# Patient Record
Sex: Female | Born: 1975
Health system: Southern US, Community
[De-identification: ages and names within clinical notes are randomized; demographics above are authoritative.]

## PROBLEM LIST (undated history)

## (undated) DIAGNOSIS — K439 Ventral hernia without obstruction or gangrene: Secondary | ICD-10-CM

## (undated) DIAGNOSIS — N946 Dysmenorrhea, unspecified: Secondary | ICD-10-CM

## (undated) DIAGNOSIS — N6452 Nipple discharge: Secondary | ICD-10-CM

## (undated) DIAGNOSIS — D649 Anemia, unspecified: Secondary | ICD-10-CM

## (undated) DIAGNOSIS — Z87442 Personal history of urinary calculi: Secondary | ICD-10-CM

## (undated) DIAGNOSIS — N6310 Unspecified lump in the right breast, unspecified quadrant: Secondary | ICD-10-CM

## (undated) DIAGNOSIS — R102 Pelvic and perineal pain: Secondary | ICD-10-CM

## (undated) DIAGNOSIS — O4400 Placenta previa specified as without hemorrhage, unspecified trimester: Secondary | ICD-10-CM

## (undated) DIAGNOSIS — N941 Unspecified dyspareunia: Secondary | ICD-10-CM

## (undated) DIAGNOSIS — N200 Calculus of kidney: Secondary | ICD-10-CM

## (undated) DIAGNOSIS — L309 Dermatitis, unspecified: Secondary | ICD-10-CM

## (undated) DIAGNOSIS — K219 Gastro-esophageal reflux disease without esophagitis: Secondary | ICD-10-CM

## (undated) DIAGNOSIS — N6320 Unspecified lump in the left breast, unspecified quadrant: Secondary | ICD-10-CM

## (undated) DIAGNOSIS — R011 Cardiac murmur, unspecified: Secondary | ICD-10-CM

## (undated) DIAGNOSIS — N920 Excessive and frequent menstruation with regular cycle: Secondary | ICD-10-CM

## (undated) DIAGNOSIS — F329 Major depressive disorder, single episode, unspecified: Secondary | ICD-10-CM

## (undated) DIAGNOSIS — R519 Headache, unspecified: Secondary | ICD-10-CM

## (undated) DIAGNOSIS — N390 Urinary tract infection, site not specified: Secondary | ICD-10-CM

## (undated) DIAGNOSIS — N898 Other specified noninflammatory disorders of vagina: Secondary | ICD-10-CM

## (undated) DIAGNOSIS — R51 Headache: Secondary | ICD-10-CM

## (undated) DIAGNOSIS — F419 Anxiety disorder, unspecified: Secondary | ICD-10-CM

## (undated) DIAGNOSIS — F32A Depression, unspecified: Secondary | ICD-10-CM

## (undated) HISTORY — PX: UPPER GI ENDOSCOPY: SHX6162

## (undated) HISTORY — DX: Dermatitis, unspecified: L30.9

## (undated) HISTORY — DX: Ventral hernia without obstruction or gangrene: K43.9

## (undated) HISTORY — DX: Nipple discharge: N64.52

## (undated) HISTORY — DX: Unspecified dyspareunia: N94.10

## (undated) HISTORY — PX: TONSILLECTOMY: SUR1361

## (undated) HISTORY — DX: Headache, unspecified: R51.9

## (undated) HISTORY — DX: Dysmenorrhea, unspecified: N94.6

## (undated) HISTORY — DX: Unspecified lump in the right breast, unspecified quadrant: N63.10

## (undated) HISTORY — DX: Anemia, unspecified: D64.9

## (undated) HISTORY — DX: Other specified noninflammatory disorders of vagina: N89.8

## (undated) HISTORY — PX: APPENDECTOMY: SHX54

## (undated) HISTORY — PX: BREAST BIOPSY: SHX20

## (undated) HISTORY — PX: COLONOSCOPY: SHX174

## (undated) HISTORY — DX: Pelvic and perineal pain: R10.2

## (undated) HISTORY — DX: Headache: R51

## (undated) HISTORY — DX: Unspecified lump in the left breast, unspecified quadrant: N63.20

## (undated) HISTORY — PX: CHOLECYSTECTOMY: SHX55

## (undated) HISTORY — DX: Excessive and frequent menstruation with regular cycle: N92.0

---

## 1999-03-05 ENCOUNTER — Other Ambulatory Visit: Admission: RE | Admit: 1999-03-05 | Discharge: 1999-03-05 | Payer: Self-pay | Admitting: *Deleted

## 1999-11-14 ENCOUNTER — Encounter: Payer: Self-pay | Admitting: Family Medicine

## 1999-11-14 ENCOUNTER — Ambulatory Visit (HOSPITAL_COMMUNITY): Admission: RE | Admit: 1999-11-14 | Discharge: 1999-11-14 | Payer: Self-pay | Admitting: Family Medicine

## 2000-01-28 ENCOUNTER — Encounter (INDEPENDENT_AMBULATORY_CARE_PROVIDER_SITE_OTHER): Payer: Self-pay | Admitting: Specialist

## 2000-01-28 ENCOUNTER — Encounter: Payer: Self-pay | Admitting: Family Medicine

## 2000-01-28 ENCOUNTER — Ambulatory Visit (HOSPITAL_COMMUNITY): Admission: RE | Admit: 2000-01-28 | Discharge: 2000-01-28 | Payer: Self-pay | Admitting: Family Medicine

## 2000-01-28 ENCOUNTER — Observation Stay (HOSPITAL_COMMUNITY): Admission: EM | Admit: 2000-01-28 | Discharge: 2000-01-29 | Payer: Self-pay | Admitting: *Deleted

## 2000-03-05 ENCOUNTER — Other Ambulatory Visit: Admission: RE | Admit: 2000-03-05 | Discharge: 2000-03-05 | Payer: Self-pay | Admitting: *Deleted

## 2001-03-01 ENCOUNTER — Emergency Department (HOSPITAL_COMMUNITY): Admission: EM | Admit: 2001-03-01 | Discharge: 2001-03-01 | Payer: Self-pay | Admitting: Emergency Medicine

## 2001-03-03 ENCOUNTER — Encounter: Payer: Self-pay | Admitting: Family Medicine

## 2001-03-03 ENCOUNTER — Ambulatory Visit (HOSPITAL_COMMUNITY): Admission: RE | Admit: 2001-03-03 | Discharge: 2001-03-03 | Payer: Self-pay | Admitting: Family Medicine

## 2001-04-30 ENCOUNTER — Encounter: Payer: Self-pay | Admitting: Emergency Medicine

## 2001-04-30 ENCOUNTER — Emergency Department (HOSPITAL_COMMUNITY): Admission: EM | Admit: 2001-04-30 | Discharge: 2001-04-30 | Payer: Self-pay | Admitting: Emergency Medicine

## 2001-06-22 ENCOUNTER — Ambulatory Visit (HOSPITAL_COMMUNITY): Admission: RE | Admit: 2001-06-22 | Discharge: 2001-06-22 | Payer: Self-pay | Admitting: Family Medicine

## 2001-06-22 ENCOUNTER — Encounter: Payer: Self-pay | Admitting: Family Medicine

## 2001-07-13 ENCOUNTER — Encounter: Payer: Self-pay | Admitting: Family Medicine

## 2001-07-13 ENCOUNTER — Ambulatory Visit (HOSPITAL_COMMUNITY): Admission: RE | Admit: 2001-07-13 | Discharge: 2001-07-13 | Payer: Self-pay | Admitting: Family Medicine

## 2001-07-31 ENCOUNTER — Emergency Department (HOSPITAL_COMMUNITY): Admission: EM | Admit: 2001-07-31 | Discharge: 2001-07-31 | Payer: Self-pay | Admitting: *Deleted

## 2001-07-31 ENCOUNTER — Encounter: Payer: Self-pay | Admitting: *Deleted

## 2002-01-07 ENCOUNTER — Encounter: Payer: Self-pay | Admitting: Family Medicine

## 2002-01-07 ENCOUNTER — Encounter: Payer: Self-pay | Admitting: *Deleted

## 2002-01-07 ENCOUNTER — Emergency Department (HOSPITAL_COMMUNITY): Admission: EM | Admit: 2002-01-07 | Discharge: 2002-01-07 | Payer: Self-pay | Admitting: *Deleted

## 2002-01-07 ENCOUNTER — Ambulatory Visit (HOSPITAL_COMMUNITY): Admission: RE | Admit: 2002-01-07 | Discharge: 2002-01-07 | Payer: Self-pay | Admitting: Family Medicine

## 2002-04-13 ENCOUNTER — Emergency Department (HOSPITAL_COMMUNITY): Admission: EM | Admit: 2002-04-13 | Discharge: 2002-04-13 | Payer: Self-pay | Admitting: Emergency Medicine

## 2002-04-13 ENCOUNTER — Encounter: Payer: Self-pay | Admitting: Emergency Medicine

## 2002-08-03 ENCOUNTER — Emergency Department (HOSPITAL_COMMUNITY): Admission: EM | Admit: 2002-08-03 | Discharge: 2002-08-03 | Payer: Self-pay | Admitting: Emergency Medicine

## 2002-08-10 ENCOUNTER — Encounter: Payer: Self-pay | Admitting: Emergency Medicine

## 2002-08-10 ENCOUNTER — Emergency Department (HOSPITAL_COMMUNITY): Admission: EM | Admit: 2002-08-10 | Discharge: 2002-08-10 | Payer: Self-pay | Admitting: Emergency Medicine

## 2002-08-31 ENCOUNTER — Emergency Department (HOSPITAL_COMMUNITY): Admission: EM | Admit: 2002-08-31 | Discharge: 2002-08-31 | Payer: Self-pay | Admitting: *Deleted

## 2002-09-10 ENCOUNTER — Encounter: Payer: Self-pay | Admitting: *Deleted

## 2002-09-10 ENCOUNTER — Emergency Department (HOSPITAL_COMMUNITY): Admission: EM | Admit: 2002-09-10 | Discharge: 2002-09-10 | Payer: Self-pay | Admitting: Anesthesiology

## 2003-01-07 ENCOUNTER — Emergency Department (HOSPITAL_COMMUNITY): Admission: EM | Admit: 2003-01-07 | Discharge: 2003-01-07 | Payer: Self-pay | Admitting: *Deleted

## 2003-07-14 ENCOUNTER — Emergency Department (HOSPITAL_COMMUNITY): Admission: EM | Admit: 2003-07-14 | Discharge: 2003-07-14 | Payer: Self-pay | Admitting: Emergency Medicine

## 2003-11-16 ENCOUNTER — Emergency Department (HOSPITAL_COMMUNITY): Admission: EM | Admit: 2003-11-16 | Discharge: 2003-11-16 | Payer: Self-pay | Admitting: Emergency Medicine

## 2004-06-03 ENCOUNTER — Emergency Department (HOSPITAL_COMMUNITY): Admission: EM | Admit: 2004-06-03 | Discharge: 2004-06-04 | Payer: Self-pay | Admitting: Emergency Medicine

## 2006-01-11 ENCOUNTER — Emergency Department (HOSPITAL_COMMUNITY): Admission: EM | Admit: 2006-01-11 | Discharge: 2006-01-11 | Payer: Self-pay | Admitting: Emergency Medicine

## 2006-02-24 ENCOUNTER — Emergency Department (HOSPITAL_COMMUNITY): Admission: EM | Admit: 2006-02-24 | Discharge: 2006-02-24 | Payer: Self-pay | Admitting: Emergency Medicine

## 2006-09-27 ENCOUNTER — Emergency Department (HOSPITAL_COMMUNITY): Admission: EM | Admit: 2006-09-27 | Discharge: 2006-09-27 | Payer: Self-pay | Admitting: Emergency Medicine

## 2006-11-17 ENCOUNTER — Emergency Department (HOSPITAL_COMMUNITY): Admission: EM | Admit: 2006-11-17 | Discharge: 2006-11-18 | Payer: Self-pay | Admitting: Emergency Medicine

## 2006-11-24 ENCOUNTER — Emergency Department (HOSPITAL_COMMUNITY): Admission: EM | Admit: 2006-11-24 | Discharge: 2006-11-24 | Payer: Self-pay | Admitting: Emergency Medicine

## 2007-03-25 ENCOUNTER — Emergency Department (HOSPITAL_COMMUNITY): Admission: EM | Admit: 2007-03-25 | Discharge: 2007-03-25 | Payer: Self-pay | Admitting: Emergency Medicine

## 2007-03-26 ENCOUNTER — Emergency Department (HOSPITAL_COMMUNITY): Admission: EM | Admit: 2007-03-26 | Discharge: 2007-03-26 | Payer: Self-pay | Admitting: Emergency Medicine

## 2007-03-28 ENCOUNTER — Emergency Department (HOSPITAL_COMMUNITY): Admission: EM | Admit: 2007-03-28 | Discharge: 2007-03-28 | Payer: Self-pay | Admitting: Emergency Medicine

## 2007-03-29 ENCOUNTER — Emergency Department (HOSPITAL_COMMUNITY): Admission: EM | Admit: 2007-03-29 | Discharge: 2007-03-29 | Payer: Self-pay | Admitting: Emergency Medicine

## 2007-07-07 ENCOUNTER — Emergency Department (HOSPITAL_COMMUNITY): Admission: EM | Admit: 2007-07-07 | Discharge: 2007-07-07 | Payer: Self-pay | Admitting: Emergency Medicine

## 2007-07-15 ENCOUNTER — Emergency Department (HOSPITAL_COMMUNITY): Admission: EM | Admit: 2007-07-15 | Discharge: 2007-07-15 | Payer: Self-pay | Admitting: Emergency Medicine

## 2007-07-20 ENCOUNTER — Emergency Department (HOSPITAL_COMMUNITY): Admission: EM | Admit: 2007-07-20 | Discharge: 2007-07-20 | Payer: Self-pay | Admitting: Emergency Medicine

## 2007-07-21 ENCOUNTER — Encounter (HOSPITAL_COMMUNITY): Admission: RE | Admit: 2007-07-21 | Discharge: 2007-08-20 | Payer: Self-pay | Admitting: Emergency Medicine

## 2007-07-28 ENCOUNTER — Ambulatory Visit (HOSPITAL_COMMUNITY): Admission: RE | Admit: 2007-07-28 | Discharge: 2007-07-29 | Payer: Self-pay | Admitting: General Surgery

## 2007-07-28 ENCOUNTER — Encounter (INDEPENDENT_AMBULATORY_CARE_PROVIDER_SITE_OTHER): Payer: Self-pay | Admitting: General Surgery

## 2008-01-31 ENCOUNTER — Emergency Department (HOSPITAL_COMMUNITY): Admission: EM | Admit: 2008-01-31 | Discharge: 2008-01-31 | Payer: Self-pay | Admitting: Emergency Medicine

## 2008-02-17 ENCOUNTER — Emergency Department (HOSPITAL_COMMUNITY): Admission: EM | Admit: 2008-02-17 | Discharge: 2008-02-17 | Payer: Self-pay | Admitting: Emergency Medicine

## 2008-09-13 ENCOUNTER — Emergency Department (HOSPITAL_COMMUNITY): Admission: EM | Admit: 2008-09-13 | Discharge: 2008-09-13 | Payer: Self-pay | Admitting: Emergency Medicine

## 2009-01-25 ENCOUNTER — Emergency Department (HOSPITAL_COMMUNITY): Admission: EM | Admit: 2009-01-25 | Discharge: 2009-01-25 | Payer: Self-pay | Admitting: Emergency Medicine

## 2009-06-02 ENCOUNTER — Emergency Department (HOSPITAL_COMMUNITY): Admission: EM | Admit: 2009-06-02 | Discharge: 2009-06-02 | Payer: Self-pay | Admitting: Emergency Medicine

## 2009-08-12 ENCOUNTER — Emergency Department (HOSPITAL_COMMUNITY): Admission: EM | Admit: 2009-08-12 | Discharge: 2009-08-12 | Payer: Self-pay | Admitting: Emergency Medicine

## 2009-11-21 ENCOUNTER — Emergency Department (HOSPITAL_COMMUNITY): Admission: EM | Admit: 2009-11-21 | Discharge: 2009-11-21 | Payer: Self-pay | Admitting: Emergency Medicine

## 2010-06-09 DIAGNOSIS — O4400 Placenta previa specified as without hemorrhage, unspecified trimester: Secondary | ICD-10-CM

## 2010-06-09 HISTORY — DX: Complete placenta previa nos or without hemorrhage, unspecified trimester: O44.00

## 2010-08-25 LAB — DIFFERENTIAL
Basophils Absolute: 0 10*3/uL (ref 0.0–0.1)
Basophils Relative: 0 % (ref 0–1)
Eosinophils Absolute: 0.1 10*3/uL (ref 0.0–0.7)
Eosinophils Relative: 2 % (ref 0–5)
Lymphocytes Relative: 40 % (ref 12–46)
Lymphs Abs: 2.5 10*3/uL (ref 0.7–4.0)
Monocytes Absolute: 0.4 10*3/uL (ref 0.1–1.0)
Monocytes Relative: 6 % (ref 3–12)
Neutro Abs: 3.3 10*3/uL (ref 1.7–7.7)
Neutrophils Relative %: 52 % (ref 43–77)

## 2010-08-25 LAB — CBC
HCT: 38.3 % (ref 36.0–46.0)
Hemoglobin: 13.1 g/dL (ref 12.0–15.0)
MCHC: 34.1 g/dL (ref 30.0–36.0)
MCV: 80.4 fL (ref 78.0–100.0)
Platelets: 219 10*3/uL (ref 150–400)
RBC: 4.77 MIL/uL (ref 3.87–5.11)
RDW: 13.3 % (ref 11.5–15.5)
WBC: 6.3 10*3/uL (ref 4.0–10.5)

## 2010-08-25 LAB — D-DIMER, QUANTITATIVE (NOT AT ARMC): D-Dimer, Quant: 0.33 ug/mL-FEU (ref 0.00–0.48)

## 2010-08-25 LAB — COMPREHENSIVE METABOLIC PANEL
ALT: 16 U/L (ref 0–35)
AST: 13 U/L (ref 0–37)
Albumin: 3.8 g/dL (ref 3.5–5.2)
Alkaline Phosphatase: 53 U/L (ref 39–117)
BUN: 10 mg/dL (ref 6–23)
CO2: 27 mEq/L (ref 19–32)
Calcium: 8.9 mg/dL (ref 8.4–10.5)
Chloride: 106 mEq/L (ref 96–112)
Creatinine, Ser: 0.57 mg/dL (ref 0.4–1.2)
GFR calc Af Amer: >60 mL/min (ref >60)
GFR calc non Af Amer: >60 mL/min (ref >60)
Glucose, Bld: 93 mg/dL (ref 70–99)
Potassium: 3.9 mEq/L (ref 3.5–5.1)
Sodium: 139 mEq/L (ref 135–145)
Total Bilirubin: 0.6 mg/dL (ref 0.3–1.2)
Total Protein: 6.8 g/dL (ref 6.0–8.3)

## 2010-08-25 LAB — POCT CARDIAC MARKERS
CKMB, poc: <1 ng/mL — ABNORMAL LOW (ref 1.0–8.0)
Myoglobin, poc: 56.3 ng/mL (ref 12–200)
Troponin i, poc: <0.05 ng/mL (ref 0.00–0.09)

## 2010-09-01 LAB — URINALYSIS, ROUTINE W REFLEX MICROSCOPIC
Bilirubin Urine: NEGATIVE
Glucose, UA: NEGATIVE mg/dL
Ketones, ur: NEGATIVE mg/dL
Leukocytes, UA: NEGATIVE
Nitrite: NEGATIVE
Protein, ur: NEGATIVE mg/dL
Specific Gravity, Urine: <1.005 — ABNORMAL LOW (ref 1.005–1.030)
Urobilinogen, UA: 0.2 mg/dL (ref 0.0–1.0)
pH: 7 (ref 5.0–8.0)

## 2010-09-01 LAB — URINE MICROSCOPIC-ADD ON

## 2010-09-01 LAB — GC/CHLAMYDIA PROBE AMP, GENITAL
Chlamydia, DNA Probe: NEGATIVE
GC Probe Amp, Genital: NEGATIVE

## 2010-09-01 LAB — WET PREP, GENITAL
Trich, Wet Prep: NONE SEEN
Yeast Wet Prep HPF POC: NONE SEEN

## 2010-09-01 LAB — PREGNANCY, URINE: Preg Test, Ur: NEGATIVE

## 2010-09-10 LAB — RPR: RPR: NONREACTIVE

## 2010-09-10 LAB — HEPATITIS B SURFACE ANTIGEN: Hepatitis B Surface Ag: NEGATIVE

## 2010-09-10 LAB — ABO/RH: RH Type: POSITIVE

## 2010-09-10 LAB — ANTIBODY SCREEN: Antibody Screen: NEGATIVE

## 2010-09-10 LAB — RUBELLA ANTIBODY, IGM: Rubella: IMMUNE

## 2010-09-10 LAB — HIV ANTIBODY (ROUTINE TESTING W REFLEX): HIV: NONREACTIVE

## 2010-10-22 NOTE — Consult Note (Signed)
Julie Julie [name], Julie [name]             ACCOUNT NO.:  [id number]   MEDICAL RECORD NO.:  [id number]          PATIENT TYPE:  EMS   LOCATION:  ED                            FACILITY:  [facility]   PHYSICIAN:  Lazaro Arms, M.D.   DATE OF BIRTH:  Dec 14, 1975   DATE OF CONSULTATION:  11/24/2006  DATE OF DISCHARGE:  11/24/2006                                 CONSULTATION   GYN CONSULTATION   DATE OF CONSULTATION:  November 24, 2006   HISTORY:  Julie Julie [name] is a 35 year old young lady who I saw in the office  actually last Friday. She came into the ER last week with some spotting  and bleeding and had an HCG of 789, an empty uterus and could not really  see anything in the adnexa at that point.  She did have a corpus  luteum  on the right.  I saw her Friday and got another HCG which, as I recall,  was about 1460 with a progesterone of I believe again by memory, was 8.1  and called her yesterday and set her up for an appointment to see me  this Friday to look with ultrasound for evaluation.  It was going up  pretty normally, actually but the progesterone level was somewhat low.  She came to the ER last night complaining of some pain and got another  ultrasound which now reveals a 1.5 cm right adnexal area consistent with  an ectopic pregnancy with corpus luteum still intact.  There is no free  fluid in the cul-de-sac.  The patient is in no real distress.  The  uterus is empty.   ASSESSMENT/PLAN:  As a result, this represents a right ectopic  pregnancy, very appropriate for treatment with methotrexate.   As a result, she is treated with methotrexate 110 mg.  She is 242  pounds, which gives her about 110 kilos and she will be seen in the  office in three days as scheduled for evaluation and see what her HCG  level is that day.  She understands this management.  She is leaving for  vacation this coming Sunday and I told her she will probably be able to  go but she needs to see me on Friday before we can make  that call.  Her  liver function tests are also done today, prior to discharge.      Lazaro Arms, M.D.  Electronically Signed     LHE/MEDQ  D:  11/24/2006  T:  11/24/2006  Job:  [id number]

## 2010-10-22 NOTE — Op Note (Signed)
Julie [name], Julie [name]             ACCOUNT NO.:  [id number]   MEDICAL RECORD NO.:  [id number]          PATIENT TYPE:  OIB   LOCATION:  A337                          FACILITY:  [facility]   PHYSICIAN:  Barbaraann Barthel, M.D. DATE OF BIRTH:  Oct 24, 1975   DATE OF PROCEDURE:  07/28/2007  DATE OF DISCHARGE:                               OPERATIVE REPORT   SURGEON:  Barbaraann Barthel, M.D.   POSTOPERATIVE DIAGNOSIS:  Cholecystitis secondary to biliary dyskinesia.   PROCEDURE:  Laparoscopic cholecystectomy. No cholangiogram   SPECIMEN:  Gallbladder.   NOTE:  This is a 35 year old white female who had least a month's  symptoms of postprandial nausea with occasional vomiting and right upper  quadrant pain.  The patient was treated with antireflux medications  without improvement.  She had a workup for gallbladder disease  which  revealed that her liver function studies were grossly within normal  limits.  However, she had hepatobiliary scan which was suggestive of  biliary dyskinesia with a diminished ejection fraction of 15%.  A  sonogram did not reveal any presence of stones.   We discussed surgery with her in detail.  Diet manipulation did not work  very well.  She opted for surgery.  We discussed the complications not  limited to but including bleeding, infection, damage to bile ducts,  perforation of organs and transitory diarrhea.  We also discussed the  fact that the results for cholecystectomy for biliary dyskinesia are not  as satisfying as the results for cholelithiasis.  The procedure was  discussed in detail.  Informed consent was obtained and all questions  were answered.   GROSS OPERATIVE FINDINGS:  The patient had a very fibrotic distal  portion of the gallbladder, a short cystic duct which was not  cannulated.  The rest of the right upper quadrant grossly was grossly  within normal limits.  The patient had some adhesions from her previous  laparoscopic appendectomy.   TECHNIQUE:  The patient was placed in supine position.  After the  adequate administration of general anesthesia via endotracheal  intubation, her entire abdomen was prepped with Betadine solution and  draped in the usual manner.  A periumbilical incision was carried out  over the superior aspect of the umbilicus after she was prepped and  draped in the usual manner and a Foley catheter was aseptically  inserted.  We grasped the fascia with a sharp towel clip and then used  the Veress needle and inflated with approximately 3.5 liters of CO2.  We  then placed an 11 mm cannula using the Visiport technique and then  another 11-mm cannula in the epigastrium and two 5 mm cannulas in the  right upper quadrant laterally.  The gallbladder was grasped.  Adhesions  were taken down.  The cystic duct was clearly visualized, triply silver  clipped on the side of the common bile duct and singly silver clipped on  the side of the gallbladder and divided.  Likewise the cystic artery was  clipped.  The gallbladder was then removed using the hook cautery  device.  There was a minimal amount of spilling.  This was then removed  using the Endo sac device.  There was minimal amount of oozing.  This  was controlled easily with a cautery device.  I did, however, elect to  leave a Jackson-Pratt drain in the liver bed as well as some Surgicel.  We then desufflated the abdomen after checking for hemostasis and closed  the fascial incisions in the area of the 11-mm cannulas in the  epigastrium and in the umbilicus and then used 0.5% Sensorcaine without  epinephrine around the port sites for postoperative comfort.  The wounds  were then closed with the stapling device.  The drain was sutured in  place with 3-0 nylon.  Prior to closure, all sponge, needle and  instrument counts were found to be correct.  Estimated blood loss was  minimal.  The patient received 1400 mL of crystalloids intraoperatively.  There were no  complications.      Barbaraann Barthel, M.D.  Electronically Signed     WB/MEDQ  D:  07/28/2007  T:  07/29/2007  Job:  [id number]   cc:   Mila Homer. Sudie Bailey, M.D.  Fax: [phone]

## 2010-10-25 NOTE — Op Note (Signed)
[facility]  Patient:    Julie [name], Julie [name]                      MRN: [id number] Proc. Date: 01/28/00 Adm. Date:  [id number] Disc. Date: [id number] Attending:  Meredith Leeds CC:         [facility]   Operative Report  PREOPERATIVE DIAGNOSIS:  Acute appendicitis.  POSTOPERATIVE DIAGNOSIS:  Abdominal pain of unknown etiology.  OPERATIONS:  Laparoscopic appendectomy.  SURGEON:  Zigmund Daniel, M.D.  ANESTHESIA:  General.  CLINICAL NOTE:  The patient is a 35 year old white female with a one day history of right lower quadrant abdominal pain.  There was no vaginal discharge or diarrhea.  She had been nauseated and had vomited twice.  There had been no fever.  White count was normal.  A CT scan of the abdomen showed thickening of the appendix.  The impression was probable appendicitis.  The patient had right lower quadrant tenderness.  Appendectomy was advised.  DESCRIPTION OF PROCEDURE:  After adequate general anesthesia and insertion of a Foley catheter and routine preparation and draping of the abdomen, I made a small infraumbilical incision and dissected down to the midline fascia, opened it longitudinally and entered the peritoneum bluntly.  I placed a pursestring 0 Vicryl suture and secured a Hasson cannula and then did laparoscopy after achievement of pneumoperitoneum.  I found no abnormalities.  The appendix looked normal.  The tubes and ovaries and uterus also looked normal.  No free fluid was noted.  The gallbladder and liver looked normal, and the small bowel looked normal.  I placed two additional ports, retracted the appendix toward the anterior abdominal wall and dissected out the appendiceal artery and clipped with three clips and divided between the two toward the appendix.  I then further dissected the mesoappendix until there was very little left and all adhesions had been taken down, and I stapled across the appendix  with a cutting stapler just at its base where it entered the cecum.  Hemostasis was good.  I irrigated the area briefly and removed the irrigant.  Again, no bleeding was evident.  I removed the appendix through the large left lower quadrant port, and it came out intact.  I anesthetized all the incisions with half-percent Marcaine with epinephrine.  I removed the ports and closed the skin of the incisions with intercuticular 4-0 Vicryl and Steri-Strips after closing the pursestring suture.  The patient tolerated the operation well. DD:  01/28/00 TD:  01/29/00 Job: [id number]

## 2010-10-25 NOTE — Group Therapy Note (Signed)
[facility]  Patient:    Julie [name], PLAZOLA Visit Number: [id number] MRN: [id number]          Service Type: EMS Location: ED Attending Physician:  Rosalyn Charters Dictated by:   John Giovanni, M.D. Proc. Date: 07/31/01 Admit Date:  07/31/2001 Discharge Date: 07/31/2001                               Progress Note  SUBJECTIVE:  The patient was sick for about 1-1/2 months off and on.  She has been on three different types of antibiotics, including amoxicillin, Augmentin, and Levaquin.  She still is feeling tight in the chest when taking deep breaths.  She is also on Xanax and Zoloft and BC pills.  She came to the emergency room because she was concerned about persistent illness.  OBJECTIVE:  She is sitting up in bed today.  She is oriented and alert, in no acute distress.  Her lungs are absolutely clear throughout, moving air well. The heart has a regular rhythm without a murmur, rate of about 70.  Her skin color is good off of oxygen.  No wheezing or rhonchi are heard.  ASSESSMENT:  Persistent bronchitis, question asthma.  PLAN:  She is getting a full workup through the emergency room, given the persistence and length of this problem.  Discussed with her at length.  She will probably need an inhaler to go home with.  She may eventually need a chest CT. Dictated by:   John Giovanni, M.D. Attending Physician:  Rosalyn Charters DD:  07/31/01 TD:  08/01/01 Job: [id number]

## 2010-11-21 ENCOUNTER — Other Ambulatory Visit (HOSPITAL_COMMUNITY)
Admission: RE | Admit: 2010-11-21 | Discharge: 2010-11-21 | Disposition: A | Discharge status: Home / Self Care | Payer: Medicaid Other | Source: Ambulatory Visit | Attending: Obstetrics & Gynecology | Admitting: Obstetrics & Gynecology

## 2010-11-21 ENCOUNTER — Other Ambulatory Visit: Payer: Self-pay | Admitting: Obstetrics & Gynecology

## 2010-11-21 DIAGNOSIS — Z01419 Encounter for gynecological examination (general) (routine) without abnormal findings: Secondary | ICD-10-CM | POA: Insufficient documentation

## 2010-11-21 DIAGNOSIS — Z113 Encounter for screening for infections with a predominantly sexual mode of transmission: Secondary | ICD-10-CM | POA: Insufficient documentation

## 2010-12-13 ENCOUNTER — Inpatient Hospital Stay (HOSPITAL_COMMUNITY): Payer: Medicaid Other

## 2010-12-13 ENCOUNTER — Inpatient Hospital Stay (HOSPITAL_COMMUNITY)
Admission: AD | Admit: 2010-12-13 | Discharge: 2010-12-13 | Disposition: A | Discharge status: Home / Self Care | Payer: Medicaid Other | Source: Ambulatory Visit | Attending: Obstetrics and Gynecology | Admitting: Obstetrics and Gynecology

## 2010-12-13 DIAGNOSIS — O4410 Placenta previa with hemorrhage, unspecified trimester: Secondary | ICD-10-CM | POA: Insufficient documentation

## 2010-12-13 DIAGNOSIS — R58 Hemorrhage, not elsewhere classified: Secondary | ICD-10-CM

## 2010-12-13 LAB — URINALYSIS, ROUTINE W REFLEX MICROSCOPIC
Bilirubin Urine: NEGATIVE
Glucose, UA: NEGATIVE mg/dL
Ketones, ur: NEGATIVE mg/dL
Nitrite: NEGATIVE
Protein, ur: NEGATIVE mg/dL
Specific Gravity, Urine: 1.02 (ref 1.005–1.030)
Urobilinogen, UA: 0.2 mg/dL (ref 0.0–1.0)
pH: 6 (ref 5.0–8.0)

## 2010-12-13 LAB — URINE MICROSCOPIC-ADD ON

## 2011-02-12 ENCOUNTER — Inpatient Hospital Stay (HOSPITAL_COMMUNITY)
Admission: AD | Admit: 2011-02-12 | Discharge: 2011-02-12 | Disposition: A | Discharge status: Home / Self Care | Payer: Medicaid Other | Source: Ambulatory Visit | Attending: Obstetrics & Gynecology | Admitting: Obstetrics & Gynecology

## 2011-02-12 ENCOUNTER — Inpatient Hospital Stay (HOSPITAL_COMMUNITY): Payer: Medicaid Other

## 2011-02-12 ENCOUNTER — Encounter (HOSPITAL_COMMUNITY): Payer: Self-pay | Admitting: *Deleted

## 2011-02-12 DIAGNOSIS — O009 Unspecified ectopic pregnancy without intrauterine pregnancy: Secondary | ICD-10-CM | POA: Insufficient documentation

## 2011-02-12 DIAGNOSIS — O4410 Placenta previa with hemorrhage, unspecified trimester: Secondary | ICD-10-CM | POA: Insufficient documentation

## 2011-02-12 DIAGNOSIS — O4400 Placenta previa specified as without hemorrhage, unspecified trimester: Secondary | ICD-10-CM

## 2011-02-12 DIAGNOSIS — O321XX0 Maternal care for breech presentation, not applicable or unspecified: Secondary | ICD-10-CM | POA: Insufficient documentation

## 2011-02-12 HISTORY — DX: Urinary tract infection, site not specified: N39.0

## 2011-02-12 NOTE — ED Provider Notes (Addendum)
History of Present Illness   Patient Identification Julie [name] is a 35 y.o. female.  Patient information was obtained from patient. History/Exam limitations: none. Patient presented to the Emergency Department by private vehicle.  Chief Complaint  Vaginal Bleeding   The patient complains vaginal bleeding described as spotting, using 0 pad(s) since onset. Onset of symptoms was abrupt starting 2 hour ago. Severity of symptoms at onset was none. Symptoms occur after urinating and wiping. She reports noticing two spots of blood on the toilet tissue.  Symptoms have been intermittent. She has had other episode of vaginal bleeding during her pregnancy. She has been diagnosed with a complete placenta previa. She is due for re-evaluation as the plan from the prenatal care provider was to reassess with ultrasound at 26-28 weeks.  She also reports feeling intermittent cramps and lower abdominal heaviness throughout the day.   Past Medical History  Diagnosis Date  . Ectopic pregnancy   . Urinary tract infection    No family history on file. Scheduled Meds:   Continuous Infusions:   PRN Meds:    Allergies  Allergen Reactions  . Biaxin Other (See Comments)    Makes throat raw   History   Social History  . Marital Status: Married    Spouse Name: N/A    Number of Children: N/A  . Years of Education: N/A   Occupational History  . Not on file.   Social History Main Topics  . Smoking status: Former Games developer  . Smokeless tobacco: Never Used  . Alcohol Use: No  . Drug Use: No  . Sexually Active: Not Currently   Other Topics Concern  . Not on file   Social History Narrative  . No narrative on file   Review of Systems Pertinent items are noted in HPI.   Physical Exam   BP 125/66  Pulse 85 General:   alert, cooperative and moderate distress  Heart: regular rate and rhythm, S1, S2 normal, no murmur, click, rub or gallop  Lungs: clear to auscultation bilaterally    Abdomen: soft, non-tender, without masses or organomegaly and obese and gravid.  Pelvic:    Vulva: normal   Vagina:  normal mucosa, no blood visualized when spreading labia.   Cervix: deferred   Uterus: gravid on palpation.   Adnexa: deffered   FHR: 150s, mild var, no decels, no accels. Toco: no contractions.  ED Course   Studies: Imaging: Ultrasound: OB. Placenta previa.   Records Reviewed: Old medical records. Prenatal records.  Treatments: None.  Consultations: none  Disposition: Awaiting ultrasound.   A/P: Placenta previa now with one episode of scant vaginal bleeding. Plan to assess placenta location with follow-up ultrasound.   US showed persistent previa. Dating consistent with previous early ultrasound. Cervix 3.4cm. Breech position. Discharged with pelvic rest, labor precautions. Instructed to contact PCP with any further bleeding.  The case was discussed with Dr. Despina Hidden who agrees with the plan.  Supervised by me with consultation with Dr Despina Hidden.

## 2011-02-12 NOTE — Progress Notes (Signed)
Noted some blood today on tissue when wiping - not enough blood to come out onto pad or in toilet. Called Dr. Despina Hidden and was told to come to hospital. Has previa.

## 2011-02-12 NOTE — Progress Notes (Signed)
Dr. Elesa Massed at bedside.  [place] and poc discussed with pt.

## 2011-02-22 ENCOUNTER — Inpatient Hospital Stay (HOSPITAL_COMMUNITY): Payer: Medicaid Other

## 2011-02-22 ENCOUNTER — Inpatient Hospital Stay (HOSPITAL_COMMUNITY)
Admission: AD | Admit: 2011-02-22 | Discharge: 2011-02-22 | Disposition: A | Discharge status: Home / Self Care | Payer: Medicaid Other | Source: Ambulatory Visit | Attending: Obstetrics and Gynecology | Admitting: Obstetrics and Gynecology

## 2011-02-22 ENCOUNTER — Encounter (HOSPITAL_COMMUNITY): Payer: Self-pay | Admitting: *Deleted

## 2011-02-22 DIAGNOSIS — O99891 Other specified diseases and conditions complicating pregnancy: Secondary | ICD-10-CM | POA: Insufficient documentation

## 2011-02-22 DIAGNOSIS — R509 Fever, unspecified: Secondary | ICD-10-CM | POA: Insufficient documentation

## 2011-02-22 DIAGNOSIS — J209 Acute bronchitis, unspecified: Secondary | ICD-10-CM | POA: Insufficient documentation

## 2011-02-22 DIAGNOSIS — R51 Headache: Secondary | ICD-10-CM | POA: Insufficient documentation

## 2011-02-22 HISTORY — DX: Complete placenta previa nos or without hemorrhage, unspecified trimester: O44.00

## 2011-02-22 LAB — URINALYSIS, ROUTINE W REFLEX MICROSCOPIC
Bilirubin Urine: NEGATIVE
Glucose, UA: NEGATIVE mg/dL
Ketones, ur: NEGATIVE mg/dL
Nitrite: NEGATIVE
Protein, ur: NEGATIVE mg/dL
Specific Gravity, Urine: 1.015 (ref 1.005–1.030)
Urobilinogen, UA: 0.2 mg/dL (ref 0.0–1.0)
pH: 7.5 (ref 5.0–8.0)

## 2011-02-22 LAB — CBC
HCT: 34.7 % — ABNORMAL LOW (ref 36.0–46.0)
Hemoglobin: 11.8 g/dL — ABNORMAL LOW (ref 12.0–15.0)
MCH: 27.9 pg (ref 26.0–34.0)
MCHC: 34 g/dL (ref 30.0–36.0)
MCV: 82 fL (ref 78.0–100.0)
Platelets: 198 10*3/uL (ref 150–400)
RBC: 4.23 MIL/uL (ref 3.87–5.11)
RDW: 13.8 % (ref 11.5–15.5)
WBC: 7.7 10*3/uL (ref 4.0–10.5)

## 2011-02-22 LAB — DIFFERENTIAL
Basophils Absolute: 0 10*3/uL (ref 0.0–0.1)
Basophils Relative: 0 % (ref 0–1)
Eosinophils Absolute: 0.1 10*3/uL (ref 0.0–0.7)
Eosinophils Relative: 1 % (ref 0–5)
Lymphocytes Relative: 23 % (ref 12–46)
Lymphs Abs: 1.8 10*3/uL (ref 0.7–4.0)
Monocytes Absolute: 0.4 10*3/uL (ref 0.1–1.0)
Monocytes Relative: 5 % (ref 3–12)
Neutro Abs: 5.5 10*3/uL (ref 1.7–7.7)
Neutrophils Relative %: 71 % (ref 43–77)

## 2011-02-22 LAB — URINE MICROSCOPIC-ADD ON

## 2011-02-22 MED ORDER — CEFTRIAXONE SODIUM 1 G IJ SOLR
1.0000 g | Freq: Once | INTRAMUSCULAR | Status: AC
  Administered 2011-02-22: 1 g via INTRAMUSCULAR
  Filled 2011-02-22: qty 1

## 2011-02-22 MED ORDER — AZITHROMYCIN 250 MG PO TABS: 250.0000 mg | ORAL_TABLET | Freq: Every day | ORAL | Status: AC

## 2011-02-22 MED ORDER — ACETAMINOPHEN 325 MG PO TABS
650.0000 mg | ORAL_TABLET | Freq: Once | ORAL | Status: AC
  Administered 2011-02-22: 650 mg via ORAL
  Filled 2011-02-22: qty 2

## 2011-02-22 NOTE — Progress Notes (Signed)
Has been sick for about 4 days, coughing up green stuff, having back pain and lower back pain, sore throat, chest hurts, fever around 100

## 2011-02-22 NOTE — ED Provider Notes (Signed)
History     Chief Complaint  Patient presents with  . Abdominal Pain  . Back Pain   HPI 35yo G2P0010 at 27.1 weeks here with productive cough with green sputum that started 4 days ago and continues to get worse.  Cough fairly constant, though worse in AM, with coughing spells that makes her lose her breath.  Nothing has improved that cough, though she has been cough so much that her back and abdomen hurt.  OB History    Grav Para Term Preterm Abortions TAB SAB Ect Mult Living   2 0 0 0 1 0 0 1 0 0       Past Medical History  Diagnosis Date  . Ectopic pregnancy   . Urinary tract infection   . Placenta previa 2012    Current pregnancy     Past Surgical History  Procedure Date  . Tonsillectomy   . Appendectomy   . Cholecystectomy   . Appendectomy     No family history on file.  History  Substance Use Topics  . Smoking status: Former Smoker    Types: Cigarettes    Quit date: 09/08/2010  . Smokeless tobacco: Never Used  . Alcohol Use: No    Allergies:  Allergies  Allergen Reactions  . Biaxin Other (See Comments)    Makes throat raw    Prescriptions prior to admission  Medication Sig Dispense Refill  . acetaminophen (TYLENOL) 325 MG tablet Take 650 mg by mouth every 6 (six) hours as needed. For headache       . prenatal vitamin w/FE, FA (PRENATAL 1 + 1) 27-1 MG TABS Take 1 tablet by mouth daily.        . sertraline (ZOLOFT) 50 MG tablet Take 100 mg by mouth daily.          Review of Systems  Constitutional: Positive for fever. Negative for chills and malaise/fatigue.  Eyes: Negative for blurred vision and double vision.  Respiratory: Positive for cough, sputum production and shortness of breath. Negative for hemoptysis and wheezing.   Cardiovascular: Negative for chest pain and palpitations.  Gastrointestinal: Positive for abdominal pain. Negative for nausea, vomiting, diarrhea and constipation.  Genitourinary: Positive for frequency. Negative for dysuria,  urgency and hematuria.  Neurological: Positive for headaches.   Physical Exam   Blood pressure 124/65, pulse 99, temperature 98 F (36.7 C), temperature source Oral, resp. rate 18, height 5\' 7"  (1.702 m), weight 133.63 kg (294 lb 9.6 oz).  Physical Exam  Constitutional: She appears well-developed and well-nourished.  HENT:  Head: Normocephalic and atraumatic.  Eyes: Conjunctivae are normal. Pupils are equal, round, and reactive to light.  Neck: Normal range of motion. Neck supple.  Cardiovascular: Normal rate and regular rhythm.  Exam reveals no gallop and no friction rub.   No murmur heard. Respiratory: Effort normal. No respiratory distress. She has wheezes. She has no rales. She exhibits tenderness.  GI: Soft. She exhibits no distension and no mass. There is no tenderness. There is no rebound and no guarding.   [place] - Cervical length 4cm.  MAU Course  Procedures NST - BR 140s, mod variability, + accel, no decel.  Assessment and Plan  Acute bronchitis -  Ceftriaxone 1g IM given.  WIll give pt prescription for azithromycin.  Continue with tylenol for headache, fever.  F/u with primary OB in 1 week.  Melissaann Dizdarevic JEHIEL 02/22/2011, 10:33 AM

## 2011-02-27 LAB — DIFFERENTIAL
Basophils Absolute: 0
Basophils Relative: 1
Eosinophils Absolute: 0.2
Eosinophils Relative: 2
Lymphocytes Relative: 40
Lymphs Abs: 3.3
Monocytes Absolute: 0.4
Monocytes Relative: 4
Neutro Abs: 4.3
Neutrophils Relative %: 53

## 2011-02-27 LAB — CBC
HCT: 37
Hemoglobin: 12.7
MCHC: 34.3
MCV: 77.7 — ABNORMAL LOW
Platelets: 245
RBC: 4.76
RDW: 14.4
WBC: 8.2

## 2011-02-27 LAB — COMPREHENSIVE METABOLIC PANEL
ALT: 16
AST: 15
Albumin: 3.8
Alkaline Phosphatase: 56
BUN: 8
CO2: 26
Calcium: 8.9
Chloride: 104
Creatinine, Ser: 0.67
GFR calc Af Amer: >60
GFR calc non Af Amer: >60
Glucose, Bld: 93
Potassium: 4
Sodium: 139
Total Bilirubin: 0.4
Total Protein: 6.7

## 2011-02-27 LAB — POCT CARDIAC MARKERS
CKMB, poc: <1 — ABNORMAL LOW
Myoglobin, poc: 70.4
Operator id: 213931
Troponin i, poc: <0.05

## 2011-02-27 LAB — D-DIMER, QUANTITATIVE: D-Dimer, Quant: 0.22

## 2011-02-27 LAB — PREGNANCY, URINE: Preg Test, Ur: NEGATIVE

## 2011-02-28 LAB — DIFFERENTIAL
Basophils Absolute: 0
Basophils Absolute: 0
Basophils Absolute: 0
Basophils Relative: 0
Basophils Relative: 1
Basophils Relative: 0
Eosinophils Absolute: 0.2
Eosinophils Absolute: 0.2
Eosinophils Absolute: 0.1
Eosinophils Relative: 2
Eosinophils Relative: 2
Eosinophils Relative: 1
Lymphocytes Relative: 39
Lymphocytes Relative: 40
Lymphocytes Relative: 33
Lymphs Abs: 2.9
Lymphs Abs: 3.6
Lymphs Abs: 2.8
Monocytes Absolute: 0.5
Monocytes Absolute: 0.5
Monocytes Absolute: 0.6
Monocytes Relative: 6
Monocytes Relative: 7
Monocytes Relative: 7
Neutro Abs: 3.8
Neutro Abs: 4.7
Neutro Abs: 4.9
Neutrophils Relative %: 51
Neutrophils Relative %: 52
Neutrophils Relative %: 59

## 2011-02-28 LAB — COMPREHENSIVE METABOLIC PANEL
ALT: 13
ALT: 16
AST: 13
AST: 15
Albumin: 3.7
Albumin: 3.7
Alkaline Phosphatase: 58
Alkaline Phosphatase: 59
BUN: 7
BUN: 8
CO2: 26
CO2: 27
Calcium: 9
Calcium: 9.1
Chloride: 104
Chloride: 107
Creatinine, Ser: 0.63
Creatinine, Ser: 0.67
GFR calc Af Amer: >60
GFR calc Af Amer: >60
GFR calc non Af Amer: >60
GFR calc non Af Amer: >60
Glucose, Bld: 92
Glucose, Bld: 96
Potassium: 3.7
Potassium: 4
Sodium: 138
Sodium: 139
Total Bilirubin: 0.2 — ABNORMAL LOW
Total Bilirubin: 0.4
Total Protein: 6.6
Total Protein: 6.7

## 2011-02-28 LAB — CBC
HCT: 36
HCT: 36.3
HCT: 31.6 — ABNORMAL LOW
Hemoglobin: 12.3
Hemoglobin: 12.5
Hemoglobin: 11 — ABNORMAL LOW
MCHC: 33.8
MCHC: 34.6
MCHC: 34.9
MCV: 76.9 — ABNORMAL LOW
MCV: 78
MCV: 77.3 — ABNORMAL LOW
Platelets: 231
Platelets: 252
Platelets: 200
RBC: 4.66
RBC: 4.68
RBC: 4.09
RDW: 14.1
RDW: 14.5
RDW: 13.9
WBC: 7.5
WBC: 9.1
WBC: 8.4

## 2011-02-28 LAB — LIPASE, BLOOD
Lipase: 19
Lipase: 23

## 2011-02-28 LAB — HEPATIC FUNCTION PANEL
ALT: 25
AST: 26
Albumin: 2.9 — ABNORMAL LOW
Alkaline Phosphatase: 59
Bilirubin, Direct: 0.1
Indirect Bilirubin: 0.4
Total Bilirubin: 0.5
Total Protein: 5.5 — ABNORMAL LOW

## 2011-02-28 LAB — URINALYSIS, ROUTINE W REFLEX MICROSCOPIC
Bilirubin Urine: NEGATIVE
Bilirubin Urine: NEGATIVE
Glucose, UA: NEGATIVE
Glucose, UA: NEGATIVE
Hgb urine dipstick: NEGATIVE
Hgb urine dipstick: NEGATIVE
Ketones, ur: NEGATIVE
Ketones, ur: NEGATIVE
Nitrite: NEGATIVE
Nitrite: NEGATIVE
Protein, ur: NEGATIVE
Protein, ur: NEGATIVE
Specific Gravity, Urine: 1.007
Specific Gravity, Urine: <1.005 — ABNORMAL LOW
Urobilinogen, UA: 0.2
Urobilinogen, UA: 0.2
pH: 5.5
pH: 6.5

## 2011-02-28 LAB — BASIC METABOLIC PANEL
BUN: 5 — ABNORMAL LOW
CO2: 26
Calcium: 8.5
Chloride: 112
Creatinine, Ser: 0.66
GFR calc Af Amer: >60
GFR calc non Af Amer: >60
Glucose, Bld: 94
Potassium: 3.8
Sodium: 141

## 2011-02-28 LAB — PREGNANCY, URINE: Preg Test, Ur: NEGATIVE

## 2011-02-28 LAB — AMYLASE: Amylase: 26 — ABNORMAL LOW

## 2011-03-12 LAB — DIFFERENTIAL
Basophils Absolute: 0
Basophils Relative: 0
Eosinophils Absolute: 0.2
Eosinophils Relative: 2
Lymphocytes Relative: 29
Lymphs Abs: 2.2
Monocytes Absolute: 0.4
Monocytes Relative: 6
Neutro Abs: 4.6
Neutrophils Relative %: 63

## 2011-03-12 LAB — CBC
HCT: 37.2
Hemoglobin: 12.5
MCHC: 33.6
MCV: 77.7 — ABNORMAL LOW
Platelets: 234
RBC: 4.79
RDW: 14.3
WBC: 7.4

## 2011-03-12 LAB — URINALYSIS, ROUTINE W REFLEX MICROSCOPIC
Bilirubin Urine: NEGATIVE
Glucose, UA: NEGATIVE
Ketones, ur: NEGATIVE
Leukocytes, UA: NEGATIVE
Nitrite: NEGATIVE
Protein, ur: NEGATIVE
Specific Gravity, Urine: 1.015
Urobilinogen, UA: 0.2
pH: 6

## 2011-03-12 LAB — COMPREHENSIVE METABOLIC PANEL
ALT: 12
AST: 15
Albumin: 3.9
Alkaline Phosphatase: 62
BUN: 7
CO2: 27
Calcium: 8.7
Chloride: 106
Creatinine, Ser: 0.63
GFR calc Af Amer: >60
GFR calc non Af Amer: >60
Glucose, Bld: 92
Potassium: 3.8
Sodium: 138
Total Bilirubin: 0.4
Total Protein: 6.8

## 2011-03-12 LAB — URINE MICROSCOPIC-ADD ON

## 2011-03-12 LAB — PREGNANCY, URINE: Preg Test, Ur: NEGATIVE

## 2011-03-26 LAB — BASIC METABOLIC PANEL
BUN: 8
CO2: 24
Calcium: 8.9
Chloride: 104
Creatinine, Ser: 0.55
GFR calc Af Amer: >60
GFR calc non Af Amer: >60
Glucose, Bld: 102 — ABNORMAL HIGH
Potassium: 3.7
Sodium: 136

## 2011-03-26 LAB — DIFFERENTIAL
Basophils Absolute: 0
Basophils Relative: 1
Eosinophils Absolute: 0.3
Eosinophils Relative: 4
Lymphocytes Relative: 42
Lymphs Abs: 3.3
Monocytes Absolute: 0.5
Monocytes Relative: 6
Neutro Abs: 3.8
Neutrophils Relative %: 48

## 2011-03-26 LAB — HEPATIC FUNCTION PANEL
ALT: 23
AST: 21
Albumin: 3.9
Alkaline Phosphatase: 57
Bilirubin, Direct: 0.1
Indirect Bilirubin: 0.5
Total Bilirubin: 0.6
Total Protein: 7

## 2011-03-26 LAB — CBC
HCT: 36.5
Hemoglobin: 12.6
MCHC: 34.4
MCV: 75.4 — ABNORMAL LOW
Platelets: 243
RBC: 4.84
RDW: 16 — ABNORMAL HIGH
WBC: 7.9

## 2011-03-26 LAB — HCG, QUANTITATIVE, PREGNANCY: hCG, Beta Chain, Quant, S: 2634 — ABNORMAL HIGH

## 2011-03-27 LAB — HCG, QUANTITATIVE, PREGNANCY: hCG, Beta Chain, Quant, S: 789 — ABNORMAL HIGH

## 2011-04-08 ENCOUNTER — Encounter (HOSPITAL_COMMUNITY): Payer: Self-pay | Admitting: *Deleted

## 2011-04-08 ENCOUNTER — Inpatient Hospital Stay (HOSPITAL_COMMUNITY)
Admission: AD | Admit: 2011-04-08 | Discharge: 2011-04-08 | Disposition: A | Discharge status: Home / Self Care | Payer: Medicaid Other | Source: Ambulatory Visit | Attending: Obstetrics & Gynecology | Admitting: Obstetrics & Gynecology

## 2011-04-08 DIAGNOSIS — O4410 Placenta previa with hemorrhage, unspecified trimester: Secondary | ICD-10-CM | POA: Insufficient documentation

## 2011-04-08 DIAGNOSIS — M7918 Myalgia, other site: Secondary | ICD-10-CM

## 2011-04-08 DIAGNOSIS — IMO0001 Reserved for inherently not codable concepts without codable children: Secondary | ICD-10-CM

## 2011-04-08 DIAGNOSIS — O99891 Other specified diseases and conditions complicating pregnancy: Secondary | ICD-10-CM | POA: Insufficient documentation

## 2011-04-08 LAB — URINALYSIS, ROUTINE W REFLEX MICROSCOPIC
Bilirubin Urine: NEGATIVE
Glucose, UA: NEGATIVE mg/dL
Hgb urine dipstick: NEGATIVE
Ketones, ur: NEGATIVE mg/dL
Leukocytes, UA: NEGATIVE
Nitrite: NEGATIVE
Protein, ur: NEGATIVE mg/dL
Specific Gravity, Urine: 1.015 (ref 1.005–1.030)
Urobilinogen, UA: 0.2 mg/dL (ref 0.0–1.0)
pH: 6 (ref 5.0–8.0)

## 2011-04-08 NOTE — Progress Notes (Signed)
Pt in c/o constant dull lower back pain with intermittent "twisting" back pain since 1400.  Reports one episode of blood-tinged mucus around 1400, none since.  Denies any leaking of fluid.  + FM.  Denies any urinary symptoms.

## 2011-04-08 NOTE — ED Provider Notes (Signed)
Julie [name] is a 35 y.o. female presenting for eval of back pain/tightening.  Had 1 episode of mucousy vag d/c earlier this afternoon (white with tinge of pink) but none since. No bright red bldg, no leak, no dysuria or fever. Maternal Medical History:  Reason for admission: Reason for Admission:   nausea  OB History    Grav Para Term Preterm Abortions TAB SAB Ect Mult Living   2 0 0 0 1 0 0 1 0 0      Past Medical History  Diagnosis Date  . Ectopic pregnancy   . Urinary tract infection   . Placenta previa 2012    Current pregnancy    Past Surgical History  Procedure Date  . Tonsillectomy   . Appendectomy   . Cholecystectomy   . Appendectomy    Family History: family history is not on file. Social History:  reports that she quit smoking about 6 months ago. Her smoking use included Cigarettes. She has never used smokeless tobacco. She reports that she does not drink alcohol or use illicit drugs.  Review of Systems  Constitutional: Negative for fever.  Gastrointestinal: Negative for nausea, vomiting and diarrhea.      Blood pressure 126/75, pulse 88, temperature 97.6 F (36.4 C), temperature source Oral, resp. rate 20, height 5' 7.5" (1.715 m), weight 138.982 kg (306 lb 6.4 oz). Maternal Exam:  Uterine Assessment: No ctx per toco  Cervix: cx C/L  Fetal Exam Fetal Monitor Review: Baseline rate: 140.  Pattern: accelerations present and no decelerations.    Fetal State Assessment: Category I - tracings are normal.     Physical Exam  Constitutional: She is oriented to person, place, and time. She appears well-developed and well-nourished.  HENT:  Head: Normocephalic.  Respiratory: Effort normal.  Musculoskeletal: Normal range of motion.  Neurological: She is alert and oriented to person, place, and time.  Skin: Skin is warm and dry.  Psychiatric: She has a normal mood and affect. Her behavior is normal.    Prenatal labs: ABO, Rh:   Antibody:   Rubella:     RPR:    HBsAg:    HIV:    GBS:     Assessment/Plan: IUP at 33.4 Complete previa Musculoskeletal discomforts of late preg  Offered Flexeril- declines at present Comfort tips given and reassured D/C home F/U as sched at [facility] or sooner if needed     Cam Hai 04/08/2011, 9:19 PM

## 2011-04-08 NOTE — Progress Notes (Signed)
Pt G2 P0, at 33.4wks, complete placenta previa reports lower back pain since 1400 small amt of blood noted today around 1400, no blood since.

## 2011-04-17 ENCOUNTER — Encounter (HOSPITAL_COMMUNITY): Payer: Self-pay | Admitting: Pharmacist

## 2011-04-17 NOTE — Patient Instructions (Signed)
   Your procedure is scheduled on:Thursday November 15th  Enter through the Main Entrance of [facility] at:6am Pick up the phone at the desk and dial [phone] and inform us of your arrival.  Please call this number if you have any problems the morning of surgery: [phone]  Remember: Do not eat food after midnight:Wednesday Do not drink clear liquids after:midnight Wednesday Take these medicines the morning of surgery with a SIP OF WATER:  Do not wear jewelry, make-up, or FINGER nail polish Do not wear lotions, powders, or perfumes.  You may not  wear deodorant. Do not shave 48 hours prior to surgery. Do not bring valuables to the hospital.  Leave suitcase in the car. After Surgery it may be brought to your room. For patients being admitted to the hospital, checkout time is 11:00am the day of discharge.     Remember to use your hibiclens as instructed.Please shower with 1/2 bottle the evening before your surgery and the other 1/2 bottle the morning of surgery.   

## 2011-04-21 ENCOUNTER — Encounter (HOSPITAL_COMMUNITY)
Admission: RE | Admit: 2011-04-21 | Discharge: 2011-04-21 | Disposition: A | Discharge status: Home / Self Care | Payer: Medicaid Other | Source: Ambulatory Visit | Attending: Obstetrics & Gynecology | Admitting: Obstetrics & Gynecology

## 2011-04-21 ENCOUNTER — Encounter (HOSPITAL_COMMUNITY): Payer: Self-pay

## 2011-04-21 HISTORY — DX: Anxiety disorder, unspecified: F41.9

## 2011-04-21 HISTORY — DX: Gastro-esophageal reflux disease without esophagitis: K21.9

## 2011-04-21 HISTORY — DX: Depression, unspecified: F32.A

## 2011-04-21 HISTORY — DX: Major depressive disorder, single episode, unspecified: F32.9

## 2011-04-21 LAB — RPR: RPR Ser Ql: NONREACTIVE

## 2011-04-21 LAB — CBC
HCT: 36.7 % (ref 36.0–46.0)
Hemoglobin: 12.5 g/dL (ref 12.0–15.0)
MCH: 28 pg (ref 26.0–34.0)
MCHC: 34.1 g/dL (ref 30.0–36.0)
MCV: 82.1 fL (ref 78.0–100.0)
Platelets: 200 10*3/uL (ref 150–400)
RBC: 4.47 MIL/uL (ref 3.87–5.11)
RDW: 13.7 % (ref 11.5–15.5)
WBC: 7.8 10*3/uL (ref 4.0–10.5)

## 2011-04-21 LAB — SURGICAL PCR SCREEN
MRSA, PCR: NEGATIVE
Staphylococcus aureus: NEGATIVE

## 2011-04-23 NOTE — H&P (Addendum)
Preoperative History and Physical  Julie [name] is a 35 y.o. G2P0010 with unsure LMP with EDD 05/20/2011 now at 36 2/[redacted] weeks gestation admitted for a primary caesarean section because of complete placenta previa.  There is no evidence of accreta.  The patient has only had one minor episode of bleeding throughout the pregnancy.  PMH:    Past Medical History  Diagnosis Date  . Ectopic pregnancy   . Urinary tract infection   . Placenta previa 2012    Current pregnancy   . Depression   . Anxiety   . GERD (gastroesophageal reflux disease)     no meds    PSH:     Past Surgical History  Procedure Date  . Tonsillectomy   . Appendectomy   . Cholecystectomy   . Appendectomy     POb/GynH:      OB History    Grav Para Term Preterm Abortions TAB SAB Ect Mult Living   2 0 0 0 1 0 0 1 0 0       SH:   History  Substance Use Topics  . Smoking status: Former Smoker    Types: Cigarettes    Quit date: 09/08/2010  . Smokeless tobacco: Never Used  . Alcohol Use: No    FH:   No family history on file.   Allergies:  Allergies  Allergen Reactions  . Biaxin Other (See Comments)    Makes throat raw    Medications:      No current facility-administered medications for this encounter. Current outpatient prescriptions:prenatal vitamin w/FE, FA (PRENATAL 1 + 1) 27-1 MG TABS, Take 1 tablet by mouth daily.  , Disp: , Rfl: ;  sertraline (ZOLOFT) 50 MG tablet, Take 100 mg by mouth daily.  , Disp: , Rfl:   Review of Systems:   Review of Systems  Constitutional: Negative for fever, chills, weight loss, malaise/fatigue and diaphoresis.  HENT: Negative for hearing loss, ear pain, nosebleeds, congestion, sore throat, neck pain, tinnitus and ear discharge.   Eyes: Negative for blurred vision, double vision, photophobia, pain, discharge and redness.  Respiratory: Negative for cough, hemoptysis, sputum production, shortness of breath, wheezing and stridor.   Cardiovascular: Negative for  chest pain, palpitations, orthopnea, claudication, leg swelling and PND.  Gastrointestinal: Positive for abdominal pain. Negative for heartburn, nausea, vomiting, diarrhea, constipation, blood in stool and melena.  Genitourinary: Negative for dysuria, urgency, frequency, hematuria and flank pain.  Musculoskeletal: Negative for myalgias, back pain, joint pain and falls.  Skin: Negative for itching and rash.  Neurological: Negative for dizziness, tingling, tremors, sensory change, speech change, focal weakness, seizures, loss of consciousness, weakness and headaches.  Endo/Heme/Allergies: Negative for environmental allergies and polydipsia. Does not bruise/bleed easily.  Psychiatric/Behavioral: Negative for depression, suicidal ideas, hallucinations, memory loss and substance abuse. The patient is not nervous/anxious and does not have insomnia.      PHYSICAL EXAM: Physical Exam  Vitals reviewed. Constitutional: She is oriented to person, place, and time. She appears well-developed and well-nourished.  HENT:  Head: Normocephalic and atraumatic.  Right Ear: External ear normal.  Left Ear: External ear normal.  Nose: Nose normal.  Mouth/Throat: Oropharynx is clear and moist.  Eyes: Conjunctivae and EOM are normal. Pupils are equal, round, and reactive to light. Right eye exhibits no discharge. Left eye exhibits no discharge. No scleral icterus.  Neck: Normal range of motion. Neck supple. No tracheal deviation present. No thyromegaly present.  Cardiovascular: Normal rate, regular rhythm, normal heart sounds and intact  distal pulses.  Exam reveals no gallop and no friction rub.   No murmur heard. Respiratory: Effort normal and breath sounds normal. No respiratory distress. She has no wheezes. She has no rales. She exhibits no tenderness.  GI: Soft. Fundal height of 38 cm. Genitourinary:       Vulva is normal without lesions Vagina is pink moist without discharge Cervix not  examined Musculoskeletal: Normal range of motion. She exhibits no edema and no tenderness.  Neurological: She is alert and oriented to person, place, and time. She has normal reflexes. She displays normal reflexes. No cranial nerve deficit. She exhibits normal muscle tone. Coordination normal.  Skin: Skin is warm and dry. No rash noted. No erythema. No pallor.  Psychiatric: She has a normal mood and affect. Her behavior is normal. Judgment and thought content normal.    Labs: Recent Results (from the past 336 hour(s))  CBC   Collection Time   04/21/11 11:19 AM      Component Value Range   WBC 7.8  4.0 - 10.5 (K/uL)   RBC 4.47  3.87 - 5.11 (MIL/uL)   Hemoglobin 12.5  12.0 - 15.0 (g/dL)   HCT 56.2  13.0 - 86.5 (%)   MCV 82.1  78.0 - 100.0 (fL)   MCH 28.0  26.0 - 34.0 (pg)   MCHC 34.1  30.0 - 36.0 (g/dL)   RDW 78.4  69.6 - 29.5 (%)   Platelets 200  150 - 400 (K/uL)  RPR   Collection Time   04/21/11 11:19 AM      Component Value Range   RPR NON REACTIVE  NON REACTIVE   SURGICAL PCR SCREEN   Collection Time   04/21/11 11:19 AM      Component Value Range   MRSA, PCR NEGATIVE  NEGATIVE    Staphylococcus aureus NEGATIVE  NEGATIVE         Assessment: 1.  36 2/[redacted] weeks gestation 2.  Complete placenta previa   Plan: Primary caesarean section.  Discussed with MFM prior to scheduling due to premature gestation and they agree with timing of delivery.  Patient understands indication for caesarean section and for timing.  She understands the baby may require NICU stay.  Patient understands possible complications including need for transfusion and even hysterectomy.  sheunderstands and will proceed.  Lazaro Arms 04/23/2011 9:57 PM  Patient seen today without changes to the exam or assessment and plan as above.  Candelaria Celeste JEHIEL 04/24/2011 6:43 AM   Stormy Connon H 04/24/2011 7:06 AM

## 2011-04-24 ENCOUNTER — Inpatient Hospital Stay (HOSPITAL_COMMUNITY): Payer: Medicaid Other | Admitting: Anesthesiology

## 2011-04-24 ENCOUNTER — Encounter (HOSPITAL_COMMUNITY): Payer: Self-pay | Admitting: Anesthesiology

## 2011-04-24 ENCOUNTER — Encounter (HOSPITAL_COMMUNITY)
Admission: RE | Disposition: A | Discharge status: Home / Self Care | Payer: Self-pay | Source: Ambulatory Visit | Attending: Obstetrics & Gynecology

## 2011-04-24 ENCOUNTER — Other Ambulatory Visit: Payer: Self-pay | Admitting: Obstetrics & Gynecology

## 2011-04-24 ENCOUNTER — Encounter (HOSPITAL_COMMUNITY): Payer: Self-pay | Admitting: *Deleted

## 2011-04-24 ENCOUNTER — Inpatient Hospital Stay (HOSPITAL_COMMUNITY)
Admission: RE | Admit: 2011-04-24 | Discharge: 2011-04-27 | DRG: 765 | Disposition: A | Discharge status: Home / Self Care | Payer: Medicaid Other | Source: Ambulatory Visit | Attending: Obstetrics & Gynecology | Admitting: Obstetrics & Gynecology

## 2011-04-24 DIAGNOSIS — O4410 Placenta previa with hemorrhage, unspecified trimester: Principal | ICD-10-CM | POA: Diagnosis present

## 2011-04-24 LAB — TYPE AND SCREEN
ABO/RH(D): A POS
Antibody Screen: NEGATIVE

## 2011-04-24 LAB — ABO/RH: ABO/RH(D): A POS

## 2011-04-24 SURGERY — Surgical Case
Anesthesia: Regional | Site: Abdomen | Wound class: Clean Contaminated

## 2011-04-24 MED ORDER — FENTANYL CITRATE 0.05 MG/ML IJ SOLN
INTRAMUSCULAR | Status: DC | PRN
  Administered 2011-04-24: 20 ug via INTRATHECAL

## 2011-04-24 MED ORDER — DIPHENHYDRAMINE HCL 25 MG PO CAPS: 25.0000 mg | ORAL_CAPSULE | Freq: Four times a day (QID) | ORAL | Status: DC | PRN

## 2011-04-24 MED ORDER — NALBUPHINE SYRINGE 5 MG/0.5 ML
5.0000 mg | INJECTION | INTRAMUSCULAR | Status: DC | PRN
  Filled 2011-04-24: qty 1

## 2011-04-24 MED ORDER — OXYTOCIN 20 UNITS IN LACTATED RINGERS INFUSION - SIMPLE: 125.0000 mL/h | INTRAVENOUS | Status: AC

## 2011-04-24 MED ORDER — ONDANSETRON HCL 4 MG/2ML IJ SOLN
INTRAMUSCULAR | Status: AC
  Filled 2011-04-24: qty 2

## 2011-04-24 MED ORDER — MORPHINE SULFATE 0.5 MG/ML IJ SOLN
INTRAMUSCULAR | Status: AC
  Filled 2011-04-24: qty 10

## 2011-04-24 MED ORDER — ONDANSETRON HCL 4 MG/2ML IJ SOLN: 4.0000 mg | Freq: Three times a day (TID) | INTRAMUSCULAR | Status: DC | PRN

## 2011-04-24 MED ORDER — DIPHENHYDRAMINE HCL 50 MG/ML IJ SOLN: 25.0000 mg | INTRAMUSCULAR | Status: DC | PRN

## 2011-04-24 MED ORDER — FENTANYL CITRATE 0.05 MG/ML IJ SOLN
INTRAMUSCULAR | Status: AC
  Filled 2011-04-24: qty 2

## 2011-04-24 MED ORDER — KETOROLAC TROMETHAMINE 60 MG/2ML IM SOLN
60.0000 mg | Freq: Once | INTRAMUSCULAR | Status: AC | PRN
  Filled 2011-04-24: qty 2

## 2011-04-24 MED ORDER — SODIUM CHLORIDE 0.9 % IJ SOLN: 3.0000 mL | INTRAMUSCULAR | Status: DC | PRN

## 2011-04-24 MED ORDER — IBUPROFEN 600 MG PO TABS
600.0000 mg | ORAL_TABLET | Freq: Four times a day (QID) | ORAL | Status: DC
  Administered 2011-04-24 – 2011-04-27 (×13): 600 mg via ORAL
  Filled 2011-04-24 (×4): qty 1

## 2011-04-24 MED ORDER — SIMETHICONE 80 MG PO CHEW: 80.0000 mg | CHEWABLE_TABLET | ORAL | Status: DC | PRN

## 2011-04-24 MED ORDER — SODIUM CHLORIDE 0.9 % IV SOLN
1.0000 ug/kg/h | INTRAVENOUS | Status: DC | PRN
  Filled 2011-04-24: qty 2.5

## 2011-04-24 MED ORDER — DIPHENHYDRAMINE HCL 50 MG/ML IJ SOLN
12.5000 mg | INTRAMUSCULAR | Status: DC | PRN
  Administered 2011-04-24: 12.5 mg via INTRAVENOUS

## 2011-04-24 MED ORDER — EPHEDRINE SULFATE 50 MG/ML IJ SOLN
INTRAMUSCULAR | Status: DC | PRN
  Administered 2011-04-24 (×3): 10 mg via INTRAVENOUS

## 2011-04-24 MED ORDER — CEFAZOLIN SODIUM 1-5 GM-% IV SOLN
INTRAVENOUS | Status: DC | PRN
  Administered 2011-04-24: 2 g via INTRAVENOUS

## 2011-04-24 MED ORDER — WITCH HAZEL-GLYCERIN EX PADS: 1.0000 "application " | MEDICATED_PAD | CUTANEOUS | Status: DC | PRN

## 2011-04-24 MED ORDER — FAMOTIDINE 20 MG PO TABS
ORAL_TABLET | ORAL | Status: AC
  Administered 2011-04-24: 20 mg via ORAL
  Filled 2011-04-24: qty 1

## 2011-04-24 MED ORDER — LANOLIN HYDROUS EX OINT: 1.0000 "application " | TOPICAL_OINTMENT | CUTANEOUS | Status: DC | PRN

## 2011-04-24 MED ORDER — DIBUCAINE 1 % RE OINT: 1.0000 "application " | TOPICAL_OINTMENT | RECTAL | Status: DC | PRN

## 2011-04-24 MED ORDER — TETANUS-DIPHTH-ACELL PERTUSSIS 5-2.5-18.5 LF-MCG/0.5 IM SUSP
0.5000 mL | Freq: Once | INTRAMUSCULAR | Status: AC
  Administered 2011-04-25: 0.5 mL via INTRAMUSCULAR
  Filled 2011-04-24: qty 0.5

## 2011-04-24 MED ORDER — SCOPOLAMINE 1 MG/3DAYS TD PT72
1.0000 | MEDICATED_PATCH | Freq: Once | TRANSDERMAL | Status: DC
  Filled 2011-04-24: qty 1

## 2011-04-24 MED ORDER — KETOROLAC TROMETHAMINE 30 MG/ML IJ SOLN
30.0000 mg | Freq: Four times a day (QID) | INTRAMUSCULAR | Status: AC | PRN
  Administered 2011-04-24: 30 mg via INTRAVENOUS

## 2011-04-24 MED ORDER — MENTHOL 3 MG MT LOZG: 1.0000 | LOZENGE | OROMUCOSAL | Status: DC | PRN

## 2011-04-24 MED ORDER — ONDANSETRON HCL 4 MG/2ML IJ SOLN
INTRAMUSCULAR | Status: DC | PRN
  Administered 2011-04-24: 4 mg via INTRAVENOUS

## 2011-04-24 MED ORDER — SCOPOLAMINE 1 MG/3DAYS TD PT72
1.0000 | MEDICATED_PATCH | Freq: Once | TRANSDERMAL | Status: DC
  Administered 2011-04-24: 1.5 mg via TRANSDERMAL

## 2011-04-24 MED ORDER — SIMETHICONE 80 MG PO CHEW
80.0000 mg | CHEWABLE_TABLET | Freq: Three times a day (TID) | ORAL | Status: DC
  Administered 2011-04-24 – 2011-04-26 (×5): 80 mg via ORAL

## 2011-04-24 MED ORDER — SENNOSIDES-DOCUSATE SODIUM 8.6-50 MG PO TABS
2.0000 | ORAL_TABLET | Freq: Every day | ORAL | Status: DC
  Administered 2011-04-25: 2 via ORAL

## 2011-04-24 MED ORDER — OXYCODONE-ACETAMINOPHEN 5-325 MG PO TABS
1.0000 | ORAL_TABLET | ORAL | Status: DC | PRN
  Filled 2011-04-24: qty 1

## 2011-04-24 MED ORDER — IBUPROFEN 600 MG PO TABS
600.0000 mg | ORAL_TABLET | Freq: Four times a day (QID) | ORAL | Status: DC | PRN
  Filled 2011-04-24 (×9): qty 1

## 2011-04-24 MED ORDER — KETOROLAC TROMETHAMINE 30 MG/ML IJ SOLN: 30.0000 mg | Freq: Four times a day (QID) | INTRAMUSCULAR | Status: AC | PRN

## 2011-04-24 MED ORDER — ONDANSETRON HCL 4 MG PO TABS: 4.0000 mg | ORAL_TABLET | ORAL | Status: DC | PRN

## 2011-04-24 MED ORDER — ZOLPIDEM TARTRATE 5 MG PO TABS: 5.0000 mg | ORAL_TABLET | Freq: Every evening | ORAL | Status: DC | PRN

## 2011-04-24 MED ORDER — PHENYLEPHRINE HCL 10 MG/ML IJ SOLN
INTRAMUSCULAR | Status: DC | PRN
  Administered 2011-04-24 (×14): 80 ug via INTRAVENOUS

## 2011-04-24 MED ORDER — MORPHINE SULFATE (PF) 0.5 MG/ML IJ SOLN
INTRAMUSCULAR | Status: DC | PRN
  Administered 2011-04-24: 150 ug via INTRATHECAL

## 2011-04-24 MED ORDER — KETOROLAC TROMETHAMINE 30 MG/ML IJ SOLN
INTRAMUSCULAR | Status: AC
  Administered 2011-04-24: 30 mg via INTRAVENOUS
  Filled 2011-04-24: qty 1

## 2011-04-24 MED ORDER — OXYTOCIN 10 UNIT/ML IJ SOLN
INTRAMUSCULAR | Status: AC
  Filled 2011-04-24: qty 2

## 2011-04-24 MED ORDER — OXYTOCIN 20 UNITS IN LACTATED RINGERS INFUSION - SIMPLE
INTRAVENOUS | Status: DC | PRN
  Administered 2011-04-24 (×2): 20 [IU] via INTRAVENOUS

## 2011-04-24 MED ORDER — NALBUPHINE SYRINGE 5 MG/0.5 ML
5.0000 mg | INJECTION | INTRAMUSCULAR | Status: DC | PRN
  Administered 2011-04-24: 10 mg via INTRAVENOUS
  Filled 2011-04-24 (×2): qty 1

## 2011-04-24 MED ORDER — SCOPOLAMINE 1 MG/3DAYS TD PT72
MEDICATED_PATCH | TRANSDERMAL | Status: AC
  Filled 2011-04-24: qty 1

## 2011-04-24 MED ORDER — NALOXONE HCL 0.4 MG/ML IJ SOLN: 0.4000 mg | INTRAMUSCULAR | Status: DC | PRN

## 2011-04-24 MED ORDER — DIPHENHYDRAMINE HCL 50 MG/ML IJ SOLN
INTRAMUSCULAR | Status: AC
  Filled 2011-04-24: qty 1

## 2011-04-24 MED ORDER — FAMOTIDINE 20 MG PO TABS
20.0000 mg | ORAL_TABLET | Freq: Once | ORAL | Status: AC
  Administered 2011-04-24: 20 mg via ORAL

## 2011-04-24 MED ORDER — PHENYLEPHRINE 40 MCG/ML (10ML) SYRINGE FOR IV PUSH (FOR BLOOD PRESSURE SUPPORT)
PREFILLED_SYRINGE | INTRAVENOUS | Status: AC
  Filled 2011-04-24: qty 25

## 2011-04-24 MED ORDER — LACTATED RINGERS IV SOLN
INTRAVENOUS | Status: DC
  Administered 2011-04-24 (×4): via INTRAVENOUS

## 2011-04-24 MED ORDER — METOCLOPRAMIDE HCL 5 MG/ML IJ SOLN: 10.0000 mg | Freq: Three times a day (TID) | INTRAMUSCULAR | Status: DC | PRN

## 2011-04-24 MED ORDER — LACTATED RINGERS IV SOLN
INTRAVENOUS | Status: DC
  Administered 2011-04-24: 17:00:00 via INTRAVENOUS

## 2011-04-24 MED ORDER — DIPHENHYDRAMINE HCL 25 MG PO CAPS: 25.0000 mg | ORAL_CAPSULE | ORAL | Status: DC | PRN

## 2011-04-24 MED ORDER — MEPERIDINE HCL 25 MG/ML IJ SOLN: 6.2500 mg | INTRAMUSCULAR | Status: DC | PRN

## 2011-04-24 MED ORDER — ONDANSETRON HCL 4 MG/2ML IJ SOLN: 4.0000 mg | INTRAMUSCULAR | Status: DC | PRN

## 2011-04-24 MED ORDER — CEFAZOLIN SODIUM 1-5 GM-% IV SOLN
INTRAVENOUS | Status: AC
  Filled 2011-04-24: qty 100

## 2011-04-24 MED ORDER — PRENATAL PLUS 27-1 MG PO TABS
1.0000 | ORAL_TABLET | Freq: Every day | ORAL | Status: DC
  Administered 2011-04-24 – 2011-04-26 (×3): 1 via ORAL
  Filled 2011-04-24 (×5): qty 1

## 2011-04-24 MED ORDER — EPHEDRINE 5 MG/ML INJ
INTRAVENOUS | Status: AC
  Filled 2011-04-24: qty 10

## 2011-04-24 SURGICAL SUPPLY — 31 items
ADH SKN CLS APL DERMABOND .7 (GAUZE/BANDAGES/DRESSINGS) ×1
CLOTH BEACON ORANGE TIMEOUT ST (SAFETY) ×2 IMPLANT
DERMABOND ADVANCED (GAUZE/BANDAGES/DRESSINGS) ×1
DERMABOND ADVANCED .7 DNX12 (GAUZE/BANDAGES/DRESSINGS) ×2 IMPLANT
DURAPREP 26ML APPLICATOR (WOUND CARE) ×4 IMPLANT
ELECT REM PT RETURN 9FT ADLT (ELECTROSURGICAL) ×2
ELECTRODE REM PT RTRN 9FT ADLT (ELECTROSURGICAL) ×1 IMPLANT
EXTRACTOR VACUUM BELL STYLE (SUCTIONS) IMPLANT
GLOVE BIOGEL PI IND STRL 8 (GLOVE) ×2 IMPLANT
GLOVE BIOGEL PI INDICATOR 8 (GLOVE) ×2
GLOVE ECLIPSE 8.0 STRL XLNG CF (GLOVE) ×2 IMPLANT
KIT ABG SYR 3ML LUER SLIP (SYRINGE) ×2 IMPLANT
NDL HYPO 25X5/8 SAFETYGLIDE (NEEDLE) ×1 IMPLANT
NEEDLE HYPO 25X5/8 SAFETYGLIDE (NEEDLE) ×2 IMPLANT
NS IRRIG 1000ML POUR BTL (IV SOLUTION) ×2 IMPLANT
PACK C SECTION WH (CUSTOM PROCEDURE TRAY) ×2 IMPLANT
RTRCTR C-SECT PINK 25CM LRG (MISCELLANEOUS) IMPLANT
SLEEVE SCD COMPRESS KNEE MED (MISCELLANEOUS) IMPLANT
STAPLER VISISTAT 35W (STAPLE) IMPLANT
SUT CHROMIC 0 CT 1 (SUTURE) ×2 IMPLANT
SUT MNCRL 0 VIOLET CTX 36 (SUTURE) ×2 IMPLANT
SUT MONOCRYL 0 CTX 36 (SUTURE) ×2
SUT PLAIN 2 0 (SUTURE)
SUT PLAIN 2 0 XLH (SUTURE) IMPLANT
SUT PLAIN ABS 2-0 CT1 27XMFL (SUTURE) IMPLANT
SUT VIC AB 0 CTX 36 (SUTURE) ×2
SUT VIC AB 0 CTX36XBRD ANBCTRL (SUTURE) ×1 IMPLANT
SUT VIC AB 4-0 KS 27 (SUTURE) ×1 IMPLANT
TOWEL OR 17X24 6PK STRL BLUE (TOWEL DISPOSABLE) ×4 IMPLANT
TRAY FOLEY CATH 14FR (SET/KITS/TRAYS/PACK) ×1 IMPLANT
WATER STERILE IRR 1000ML POUR (IV SOLUTION) ×1 IMPLANT

## 2011-04-24 NOTE — Anesthesia Preprocedure Evaluation (Signed)
Anesthesia Evaluation  Patient identified by MRN, date of birth, ID band Patient awake    Reviewed: Allergy & Precautions, H&P , Patient's Chart, lab work & pertinent test results  Airway Mallampati: II TM Distance: >3 FB Neck ROM: full    Dental No notable dental hx.    Pulmonary  clear to auscultation  Pulmonary exam normal       Cardiovascular Exercise Tolerance: Good regular Normal    Neuro/Psych    GI/Hepatic GERD- (rare sx, will give pepcid)  Controlled,  Endo/Other  Morbid obesity  Renal/GU      Musculoskeletal   Abdominal   Peds  Hematology   Anesthesia Other Findings   Reproductive/Obstetrics                           Anesthesia Physical Anesthesia Plan  ASA: III  Anesthesia Plan: Spinal   Post-op Pain Management:    Induction:   Airway Management Planned:   Additional Equipment:   Intra-op Plan:   Post-operative Plan:   Informed Consent: I have reviewed the patients History and Physical, chart, labs and discussed the procedure including the risks, benefits and alternatives for the proposed anesthesia with the patient or authorized representative who has indicated his/her understanding and acceptance.   Dental Advisory Given  Plan Discussed with: CRNA  Anesthesia Plan Comments: (Lab work confirmed with CRNA in room. Platelets okay. Discussed spinal anesthetic, and patient consents to the procedure:  included risk of possible headache,backache, failed block, allergic reaction, and nerve injury. This patient was asked if she had any questions or concerns before the procedure started. )        Anesthesia Quick Evaluation

## 2011-04-24 NOTE — Op Note (Signed)
[facility] PROCEDURE DATE: 04/24/2011  PREOPERATIVE DIAGNOSIS: Intrauterine pregnancy at  [redacted]w[redacted]d weeks gestation; placenta previa  POSTOPERATIVE DIAGNOSIS: The same  PROCEDURE:    Primary Low Transverse Cesarean Section  SURGEON:  Dr. Duane Lope  ASSISTANT: Dr Candelaria Celeste  INDICATIONS: Julie [name] is a 35 y.o. [id number] at [redacted]w[redacted]d scheduled for cesarean section secondary to placenta previa.  The risks of cesarean section discussed with the patient included but were not limited to: bleeding which may require transfusion or reoperation; infection which may require antibiotics; injury to bowel, bladder, ureters or other surrounding organs; injury to the fetus; need for additional procedures including hysterectomy in the event of a life-threatening hemorrhage; placental abnormalities wth subsequent pregnancies, incisional problems, thromboembolic phenomenon and other postoperative/anesthesia complications. The patient concurred with the proposed plan, giving informed written consent for the procedure.    FINDINGS:  Viable female infant in cephalic presentation.  Apgars 9 and 9, weight, 6 pounds and 15 ounces.  Clear amniotic fluid.  Intact placenta, three vessel cord.  Normal uterus, fallopian tubes and ovaries bilaterally.  ANESTHESIA:    Spinal INTRAVENOUS FLUIDS: 3000 ml ESTIMATED BLOOD LOSS: 1000 ml URINE OUTPUT:  200 ml SPECIMENS: Placenta sent to pathology COMPLICATIONS: None immediate  PROCEDURE IN DETAIL:  The patient received intravenous antibiotics and had sequential compression devices applied to her lower extremities while in the preoperative area.  She was then taken to the operating room where spinal anesthesia was administered and was found to be adequate. She was then placed in a dorsal supine position with a leftward tilt, and prepped and draped in a sterile manner.  A foley catheter was placed into her bladder, attached to constant gravity, and drained clear urine  throughout.  After an adequate timeout was performed, a Pfannenstiel skin incision was made with scalpel and carried through to the underlying layer of fascia. The fascia was incised in the midline and this incision was extended bilaterally using the Mayo scissors. Kocher clamps were applied to the superior aspect of the fascial incision and the underlying rectus muscles were dissected off bluntly. A similar process was carried out on the inferior aspect of the facial incision. The rectus muscles were separated in the midline bluntly and the peritoneum was entered bluntly. Attention was turned to the lower uterine segment, where a transverse hysterotomy was made with a scalpel and extended bilaterally bluntly. The bladder blade was then removed. Due to difficulty in delivering the infant's head, a Mityvac was applied approximately 3 cm from the anterior fontenelle.  Pressure was increased to approximately 500 mmHg and constant traction was applied, resulting in delivery of the infants head.  The vacuum was immediately discontinued and the remainder of the infant was successfully delivered, and cord was clamped and cut and infant was handed over to awaiting neonatology team. Uterine massage was then administered and the placenta delivered intact with three-vessel cord. The uterus was then cleared of clot and debris.  The hysterotomy was closed with 0 Monocryl in a running locked fashion, and an imbricating layer was also placed with a 0 Monocryl. Overall, excellent hemostasis was noted. The abdomen and the pelvis were copiously irrigated and cleared of all clot and debris. Hemostasis was confirmed on all surfaces.  The muscles and peritoneum were reapproximated using 2-0 Chromic interupted stitches. The fascia was then closed using 0 Vicryl in a running fashion.  The skin was then closed with running subcuticular stitch of 4-0 vicryl and re-enforced with derma bond.  The patient tolerated the procedure well. Sponge,  lap, instrument and needle counts were correct x 2. She was taken to the recovery room in stable condition.  Candelaria Celeste JEHIEL DO 04/24/2011 8:48 AM

## 2011-04-24 NOTE — Transfer of Care (Signed)
Immediate Anesthesia Transfer of Care Note  Patient: Julie [name]  Procedure(s) Performed:  CESAREAN SECTION - Primary/Previa  Patient Location: PACU  Anesthesia Type: Spinal  Level of Consciousness: awake and alert   Airway & Oxygen Therapy: Patient Spontanous Breathing  Post-op Assessment: Report given to PACU RN  Post vital signs: Reviewed and stable  Complications: No apparent anesthesia complications

## 2011-04-24 NOTE — Progress Notes (Signed)
UR chart review completed.  

## 2011-04-24 NOTE — Anesthesia Postprocedure Evaluation (Signed)
Anesthesia Post Note  Patient: Julie [name]  Procedure(s) Performed:  CESAREAN SECTION - Primary/Previa  Anesthesia type: Spinal  Patient location: PACU  Post pain: Pain level controlled  Post assessment: Post-op Vital signs reviewed  Last Vitals:  Filed Vitals:   04/24/11 1030  BP: 124/66  Pulse: 83  Temp:   Resp:     Post vital signs: Reviewed  Level of consciousness: awake  Complications: No apparent anesthesia complications

## 2011-04-24 NOTE — Anesthesia Procedure Notes (Signed)
Spinal  Patient location during procedure: OR Preanesthetic Checklist Completed: patient identified, site marked, surgical consent, pre-op evaluation, timeout performed, IV checked, risks and benefits discussed and monitors and equipment checked Spinal Block Patient position: sitting Prep: DuraPrep Patient monitoring: heart rate, cardiac monitor, continuous pulse ox and blood pressure Approach: midline Location: L3-4 Injection technique: single-shot Needle Needle type: Sprotte  Needle gauge: 24 G Needle length: 9 cm Assessment Sensory level: T4 Additional Notes Spinal Dosage in OR  Bupivicaine ml       1.8 PFMS04   mcg        150 Fentanyl mcg            20    

## 2011-04-24 NOTE — Consult Note (Signed)
REquested to attend primary C/S for complete previa at 36+ weeks gestation.At birth infant in vertex and required vacuum to deliver head.  A shoulder cord, body cord and true know were documented at that time.  With completion of the delivery infant had a spontaneous cry and good tone. He remained largely quiet thereafter but with regular respiratory rate and MAE.  He received tactile stimulation with drying and bulb suction of naso/oropharynx.  There were no dysmorphic features noted. Apgar scores assigned of 9 and 9 at one and five minutes  Shown to parents then carried to Transitional Nursery in warm blanket. Care to assigned pediatrician.    Dagoberto Ligas MD [facility]\

## 2011-04-24 NOTE — Addendum Note (Signed)
Addendum  created 04/24/11 1118 by Velna Hatchet, MD   Modules edited:PRL Based Order Sets

## 2011-04-25 LAB — CBC
HCT: 28.5 % — ABNORMAL LOW (ref 36.0–46.0)
Hemoglobin: 9.5 g/dL — ABNORMAL LOW (ref 12.0–15.0)
MCH: 27.8 pg (ref 26.0–34.0)
MCHC: 33.3 g/dL (ref 30.0–36.0)
MCV: 83.3 fL (ref 78.0–100.0)
Platelets: 148 10*3/uL — ABNORMAL LOW (ref 150–400)
RBC: 3.42 MIL/uL — ABNORMAL LOW (ref 3.87–5.11)
RDW: 14.1 % (ref 11.5–15.5)
WBC: 7.7 10*3/uL (ref 4.0–10.5)

## 2011-04-25 MED ORDER — SERTRALINE HCL 100 MG PO TABS
100.0000 mg | ORAL_TABLET | Freq: Every day | ORAL | Status: DC
  Administered 2011-04-25 – 2011-04-27 (×3): 100 mg via ORAL
  Filled 2011-04-25 (×4): qty 1

## 2011-04-25 NOTE — Progress Notes (Signed)
I have seen and examined this patient in conjunction with Mosetta Putt, PA-S.  I have taken this history and preformed the exam.  I agree with the note as written above and have made corrections as needed.  Baby not breastfeeding well.  Will have lactation see patient to assist in breastfeeding - okay to d/c to home if breastfeeding well.  Candelaria Celeste JEHIEL 04/25/2011 12:51 PM

## 2011-04-25 NOTE — Progress Notes (Signed)
Patient is a 34 YO [id number]. Post Partum Day 1 Subjective: no complaints, up ad lib, voiding, tolerating PO, + flatus and has not had a bowel movement yet.  Objective: Blood pressure 112/64, pulse 85, temperature 98.5 F (36.9 C), temperature source Oral, resp. rate 16, weight 138.347 kg (305 lb), SpO2 97.00%, unknown if currently breastfeeding.  Physical Exam:  General: alert, cooperative and no distress Cardio: regular rate and rhythm. No rubs, gallops, murmurs Lungs: clear to auscultation bilaterally. No wheezes, rhonchi. Lochia: appropriate Uterine Fundus: firm Incision: healing well, no significant drainage, no dehiscence, no significant erythema DVT Evaluation: No cords or calf tenderness.   Basename 04/25/11 0520  HGB 9.5*  HCT 28.5*    Assessment/Plan: Plan for discharge tomorrow, Breastfeeding and Contraception depo-provera. Patient's newborn son will receive circumcision outpatient with Dr. Despina Hidden. Patient reports she is able to go home today. Will monitor for bowel movement and administer stool softener as needed.    LOS: 1 day   Mosetta Putt 04/25/2011, 7:55 AM

## 2011-04-25 NOTE — Anesthesia Postprocedure Evaluation (Signed)
  Anesthesia Post-op Note  Patient: Julie [name]  Procedure(s) Performed:  CESAREAN SECTION - Primary/Previa  Patient Location: Mother/Baby  Anesthesia Type: Spinal  Level of Consciousness: awake, alert  and oriented  Airway and Oxygen Therapy: Patient Spontanous Breathing  Post-op Assessment: Patient's Cardiovascular Status Stable and Respiratory Function Stable  Post-op Vital Signs: Reviewed and stable  Complications: No apparent anesthesia complications

## 2011-04-25 NOTE — Addendum Note (Signed)
Addendum  created 04/25/11 0818 by Edison Pace, CRNA   Modules edited:Charges VN, Notes Section

## 2011-04-26 MED ORDER — PRENATAL PLUS 27-1 MG PO TABS: 1.0000 | ORAL_TABLET | Freq: Every day | ORAL | Status: DC

## 2011-04-26 MED ORDER — OXYCODONE-ACETAMINOPHEN 5-325 MG PO TABS: 1.0000 | ORAL_TABLET | ORAL | Status: AC | PRN

## 2011-04-26 MED ORDER — SENNOSIDES-DOCUSATE SODIUM 8.6-50 MG PO TABS: 2.0000 | ORAL_TABLET | Freq: Every day | ORAL | Status: AC

## 2011-04-26 MED ORDER — IBUPROFEN 600 MG PO TABS: 600.0000 mg | ORAL_TABLET | Freq: Four times a day (QID) | ORAL | Status: AC | PRN

## 2011-04-26 NOTE — Discharge Summary (Signed)
Obstetric Discharge Summary Reason for Admission: scheduled cesarean section for placenta previa  Prenatal Procedures: NST Intrapartum Procedures: cesarean: low cervical, transverse Postpartum Procedures: none Complications-Operative and Postpartum: none Hemoglobin  Date Value Range Status  04/25/2011 9.5* 12.0-15.0 (g/dL) Final     HCT  Date Value Range Status  04/25/2011 28.5* 36.0-46.0 (%) Final    Discharge Diagnoses: Preterm scheduled C-section due to placenta previa  Patient is a [id number] who presented at 35.6 for scheduled uncomplicated lower cervical transverse Caesarian section due to placenta previa. 1 L blood loss. Hgb 9.5 post op.  Mother plans to breast feed. Wants depo-provera for contraception. Still without bowel movement so will continue stool softener. Had lactation consultation with improved breast feeding.   Discharge Information: Date: 04/26/2011 Activity: pelvic rest Diet: routine Medications: Ibuprofen, Colace, Percocet and zoloft Condition: stable Instructions: refer to practice specific booklet Discharge to: home Follow-up Information    Follow up with [facility]. Make an appointment in 6 weeks.   Contact information:   [address] [place] [address] [phone]         Newborn Data: Live born female, preterm  Birth Weight: 6 lb 15.5 oz (3160 g) APGAR: 9, 9  Home with mother. Outpatient circumcision with Dr. Despina Hidden.   Tana Conch 04/26/2011, 7:44 AM

## 2011-04-26 NOTE — Progress Notes (Signed)
Patient is a 35 YO [id number].   Subjective: Postpartum Day 2: Cesarean Delivery Patient reports tolerating PO, + flatus and no problems voiding. No bowel movement yet. Breastfeeding has improved.   Objective: Vital signs in last 24 hours: Temp:  [98 F (36.7 C)-98.5 F (36.9 C)] 98.4 F (36.9 C) (11/17 0531) Pulse Rate:  [79-97] 79  (11/17 0531) Resp:  [18] 18  (11/17 0531) BP: (103-125)/(67-80) 116/80 mmHg (11/17 0531) SpO2:  [98 %-99 %] 99 % (11/16 2154)  Physical Exam:  General: alert, cooperative, no distress and moderately obese Lochia: appropriate Uterine Fundus: firm Incision: healing well DVT Evaluation: No cords or calf tenderness. No significant calf/ankle edema.   Basename 04/25/11 0520  HGB 9.5*  HCT 28.5*    Assessment/Plan: Status post Cesarean section. Doing well postoperatively.  Discharge home with standard precautions and return to clinic in 4-6 weeks. Plans to breast feed. Wants depo-provera. Outpatient circumcision with Dr. Despina Hidden. Ready to go home today as breastfeeding has improved. Will continue stool softener to help with BM.   Agnes Brightbill 04/26/2011, 7:18 AM

## 2011-04-26 NOTE — Discharge Summary (Signed)
Attestation of Attending Supervision of Resident: Evaluation and management procedures were performed by the Family Medicine Resident under my supervision.  I have reviewed the resident's note, chart reviewed and agree with management and plan.  Bucky Grigg, M.D. 04/26/2011 11:37 AM    

## 2011-04-27 ENCOUNTER — Encounter (HOSPITAL_COMMUNITY): Payer: Self-pay | Admitting: Obstetrics & Gynecology

## 2011-04-27 NOTE — Progress Notes (Signed)
          CLINICAL SOCIAL WORK  BRIEF PSYCHOSOCIAL ASSESSMENT  Referred by  __RN_______________ on __11/18/12_______ for   Anxiety, concern about SIDS       XPatient Interview Joella Prince Interview  Juanda Bond:  Chart review and discussion with RN PSYCHOSOCIAL DATA:  Lives with  Husband and children       qAdmitted from Facility: __________________________________ Level of Care:      Primary Support (Name/Relationship):   Darryl Ghee-husband/Fob      Degree of support available:      great       CURRENT CONCERNS:      Other ______concerns about SIDS, heightened anxiety SOCIAL WORK ASSESSMENT/PLAN:  Pt open to meeting with CSW about anxiety. Pt on zoloft and reports that works well for her. She has an appointment with Miguel Aschoff. MH in January and can get earlier appt. If needed. Per RN, Pt very concerned for baby and possibility of SIDS. Pt expressed she has not slept due to these concerns. CSW explained how lack of sleep can impact coping in the postpartum period. Pt plans to get some good rest today. Pt and family were educated on baby blues and ppdepression. ________________ XPsychosocial Support/Ongoing Assessment of Needs XInformation/Referral to [employer] provided feelings after birth info and discussed pp depression/anxiety.          PATIENT'S/FAMILY'S RESPONSE TO PLAN OF CARE:  Pt very open to CSW assistance. Family and Pt aware and agree to seek further assistance for anxiety/pp depression as needed. Pt plans to get rest today.Pt has great supports to assist her. CSW available as needed.                                                  ______________Hock, Solmon Ice, LCSWA 04/27/2011 10:50 AM     Clinical Social Worker's Teacher, music

## 2011-04-27 NOTE — Discharge Summary (Signed)
Obstetric Discharge Summary Reason for Admission: cesarean section Prenatal Procedures: ultrasound Intrapartum Procedures: cesarean: low cervical, transverse Postpartum Procedures: none Complications-Operative and Postpartum: none Hemoglobin  Date Value Range Status  04/25/2011 9.5* 12.0-15.0 (g/dL) Final     HCT  Date Value Range Status  04/25/2011 28.5* 36.0-46.0 (%) Final    Discharge Diagnoses: Placenta previa primary c/s @ 35 6  Discharge Information: Date: 04/27/2011 Activity: pelvic rest Diet: routine Medications: PNV, Ibuprofen and Percocet Condition: stable and improved Instructions: refer to practice specific booklet Discharge to: home Follow-up Information    Follow up with [facility]. Make an appointment in 6 weeks.   Contact information:   [address] [place] [address] [phone]         Newborn Data: Live born female  Birth Weight: 6 lb 15.5 oz (3160 g) APGAR: 9, 9  Home with mother.  [name] 04/27/2011, 6:49 AM

## 2011-04-27 NOTE — Progress Notes (Signed)
Subjective: Postpartum Day 3*: Cesarean Delivery Patient reports tolerating PO, + flatus, + BM and no problems voiding.    Objective: Vital signs in last 24 hours: Temp:  [98.4 F (36.9 C)-98.5 F (36.9 C)] 98.5 F (36.9 C) (11/18 1610) Pulse Rate:  [90-106] 90  (11/18 0642) Resp:  [18] 18  (11/18 0642) BP: (115-135)/(72-85) 115/72 mmHg (11/18 9604)  Physical Exam:  General: alert, cooperative, appears stated age and no distress Lochia: appropriate Uterine Fundus: firm Incision: healing well, no significant drainage DVT Evaluation: No evidence of DVT seen on physical exam. Negative Homan's sign. No cords or calf tenderness.   Basename 04/25/11 0520  HGB 9.5*  HCT 28.5*    Assessment/Plan: Status post Cesarean section. Doing well postoperatively.  Discharge home with standard precautions and return to clinic in 4-6 weeks.  [name] 04/27/2011, 6:54 AM

## 2011-04-29 NOTE — Discharge Summary (Signed)
Attestation of Attending Supervision of Advanced Practitioner: Evaluation and management procedures were performed by the PA/NP/CNM/OB Fellow under my supervision/collaboration. Chart reviewed, and agree with management and plan.  Jaynie Collins, M.D. 04/29/2011 10:15 AM

## 2012-10-19 ENCOUNTER — Encounter: Payer: Self-pay | Admitting: *Deleted

## 2012-10-20 ENCOUNTER — Encounter: Payer: Self-pay | Admitting: Adult Health

## 2012-10-20 ENCOUNTER — Ambulatory Visit (INDEPENDENT_AMBULATORY_CARE_PROVIDER_SITE_OTHER): Payer: BC Managed Care – PPO | Admitting: Adult Health

## 2012-10-20 ENCOUNTER — Other Ambulatory Visit (HOSPITAL_COMMUNITY)
Admission: RE | Admit: 2012-10-20 | Discharge: 2012-10-20 | Disposition: A | Discharge status: Home / Self Care | Payer: BC Managed Care – PPO | Source: Ambulatory Visit | Attending: Adult Health | Admitting: Adult Health

## 2012-10-20 VITALS — BP 118/78 | HR 78 | Ht 67.0 in | Wt 285.0 lb

## 2012-10-20 DIAGNOSIS — Z01419 Encounter for gynecological examination (general) (routine) without abnormal findings: Secondary | ICD-10-CM | POA: Insufficient documentation

## 2012-10-20 DIAGNOSIS — Z1151 Encounter for screening for human papillomavirus (HPV): Secondary | ICD-10-CM | POA: Insufficient documentation

## 2012-10-20 DIAGNOSIS — N6452 Nipple discharge: Secondary | ICD-10-CM

## 2012-10-20 DIAGNOSIS — N6310 Unspecified lump in the right breast, unspecified quadrant: Secondary | ICD-10-CM

## 2012-10-20 HISTORY — DX: Unspecified lump in the right breast, unspecified quadrant: N63.10

## 2012-10-20 LAB — PROLACTIN: Prolactin: 7.4 ng/mL

## 2012-10-20 LAB — TSH: TSH: 1.791 u[IU]/mL (ref 0.350–4.500)

## 2012-10-20 NOTE — Progress Notes (Signed)
Patient ID: Julie [name], female   DOB: 10-28-1975, 37 y.o.   MRN: [id number] History of Present Illness: Abagael is a 37 year old white female in for her pap and physical.She has breast milk still, and a mass in right breast. Last breast feed about a year ago.  Current Medications, Allergies, Past Medical History, Past Surgical History, Family History and Social History were reviewed in [employer] record.    Review of Systems: Patient denies any blurred vision, shortness of breath, chest pain, abdominal pain, problems with bowel movements, urination, or intercourse. No joint pain or swelling, moody at times, takes zoloft, she has headaches and uses advil. Positives as in HPI. Pt. not using birth control is ok if gets pregnant, told to take prenatal vitamins.  Physical Exam:Blood pressure 118/78, pulse 78, height 5\' 7"  (1.702 m), weight 285 lb (129.275 kg), last menstrual period 10/07/2012, not currently breastfeeding. General:  Well developed, well nourished, no acute distress Skin:  Warm and dry,tan Neck:  Midline trachea, normal thyroid Lungs; Clear to auscultation bilaterally Breast: Left: No dominant palpable mass, retraction, she has nipple discharge white. Right: has round mobile tender mass at 4 0;clock 2 finger breaths from areola, no retraction, positive white like milk discharge, had TSH and prolactin drawn before exam. Cardiovascular: Regular rate and rhythm Abdomen:  Soft, non tender, no hepatosplenomegaly Pelvic:  External genitalia is normal in appearance.  The vagina is normal in appearance. The cervix is bulbous. Pap with HPV. Uterus is felt to be normal size, shape, and contour.  No  adnexal masses or tenderness noted. Extremities:  No swelling or varicosities noted Psych:  Alert and cooperative, happy today   Impression: Yearly GYN exam Right breast mass and bilateral breast discharge  Plan: Check TSH, and prolactin level, follow up in 48  hours by phone or my chart Bilateral diagnostic mammogram and right breast US at [facility] 5/28 at 8:45 am be there at 8:30 am Physical in 1 year

## 2012-10-20 NOTE — Patient Instructions (Addendum)
Bilateral diagnostic mammogram and right [place] at [facility] 5/28 at 8:45 be there 8:30 Physical in 1 year Sign up for my chart

## 2012-10-21 ENCOUNTER — Telehealth: Payer: Self-pay | Admitting: Adult Health

## 2012-10-21 NOTE — Telephone Encounter (Signed)
Pt aware prolactin and TSH normal 

## 2012-11-03 ENCOUNTER — Encounter (HOSPITAL_COMMUNITY): Payer: Medicaid Other

## 2012-11-04 ENCOUNTER — Ambulatory Visit
Admission: RE | Admit: 2012-11-04 | Discharge: 2012-11-04 | Disposition: A | Discharge status: Home / Self Care | Payer: BC Managed Care – PPO | Source: Ambulatory Visit | Attending: Adult Health | Admitting: Adult Health

## 2012-11-04 ENCOUNTER — Other Ambulatory Visit: Payer: Self-pay | Admitting: Adult Health

## 2012-11-04 ENCOUNTER — Telehealth: Payer: Self-pay | Admitting: Adult Health

## 2012-11-04 DIAGNOSIS — N6310 Unspecified lump in the right breast, unspecified quadrant: Secondary | ICD-10-CM

## 2012-11-04 DIAGNOSIS — N6452 Nipple discharge: Secondary | ICD-10-CM

## 2012-11-04 HISTORY — PX: BREAST BIOPSY: SHX20

## 2012-11-04 NOTE — Telephone Encounter (Signed)
Pt had breast biopsy today,I called to check on her and she is doing well

## 2012-11-09 ENCOUNTER — Telehealth: Payer: Self-pay | Admitting: Adult Health

## 2012-11-09 NOTE — Telephone Encounter (Signed)
Called pt that I got the path. Back and the biopsy of the right breast was benign.

## 2013-01-04 ENCOUNTER — Encounter: Payer: Self-pay | Admitting: Adult Health

## 2013-01-04 ENCOUNTER — Ambulatory Visit (INDEPENDENT_AMBULATORY_CARE_PROVIDER_SITE_OTHER): Payer: BC Managed Care – PPO | Admitting: Adult Health

## 2013-01-04 VITALS — BP 110/78 | Ht 67.0 in | Wt 282.0 lb

## 2013-01-04 DIAGNOSIS — K439 Ventral hernia without obstruction or gangrene: Secondary | ICD-10-CM

## 2013-01-04 HISTORY — DX: Ventral hernia without obstruction or gangrene: K43.9

## 2013-01-04 NOTE — Patient Instructions (Addendum)
Will refer to Dr Malvin Johns [id number] Follow prn Hernia A hernia occurs when an internal organ pushes out through a weak spot in the abdominal wall. Hernias most commonly occur in the groin and around the navel. Hernias often can be pushed back into place (reduced). Most hernias tend to get worse over time. Some abdominal hernias can get stuck in the opening (irreducible or incarcerated hernia) and cannot be reduced. An irreducible abdominal hernia which is tightly squeezed into the opening is at risk for impaired blood supply (strangulated hernia). A strangulated hernia is a medical emergency. Because of the risk for an irreducible or strangulated hernia, surgery may be recommended to repair a hernia. CAUSES   Heavy lifting.  Prolonged coughing.  Straining to have a bowel movement.  A cut (incision) made during an abdominal surgery. HOME CARE INSTRUCTIONS   Bed rest is not required. You may continue your normal activities.  Avoid lifting more than 10 pounds (4.5 kg) or straining.  Cough gently. If you are a smoker it is best to stop. Even the best hernia repair can break down with the continual strain of coughing. Even if you do not have your hernia repaired, a cough will continue to aggravate the problem.  Do not wear anything tight over your hernia. Do not try to keep it in with an outside bandage or truss. These can damage abdominal contents if they are trapped within the hernia sac.  Eat a normal diet.  Avoid constipation. Straining over long periods of time will increase hernia size and encourage breakdown of repairs. If you cannot do this with diet alone, stool softeners may be used. SEEK IMMEDIATE MEDICAL CARE IF:   You have a fever.  You develop increasing abdominal pain.  You feel nauseous or vomit.  Your hernia is stuck outside the abdomen, looks discolored, feels hard, or is tender.  You have any changes in your bowel habits or in the hernia that are unusual for  you.  You have increased pain or swelling around the hernia.  You cannot push the hernia back in place by applying gentle pressure while lying down. MAKE SURE YOU:   Understand these instructions.  Will watch your condition.  Will get help right away if you are not doing well or get worse. Document Released: 05/26/2005 Document Revised: 08/18/2011 Document Reviewed: 01/13/2008 [facility] Patient Information 2014 [place], [place].

## 2013-01-04 NOTE — Progress Notes (Signed)
Subjective:     Patient ID: Glendale Chard, female   DOB: [birth year], 37 y.o.   MRN: [id number]  HPI Aldeen is a 37 year old white female in complaining of a mass in abdomen.She has some pain if pushes on it, she has had a C-section and appendectomy and GB removal.  Review of Systems See HPI Reviewed past medical,surgical, social and family history. Reviewed medications and allergies.     Objective:   Physical Exam BP 110/78  Ht 5\' 7"  (1.702 m)  Wt 282 lb (127.914 kg)  BMI 44.16 kg/m2  LMP 12/13/2012  Breastfeeding? No   On abdominal exam there is a small bulge when she coughs on right side of lower abdomen above her C-section scar, and it is tender when palpated.Dr Emelda Fear in to co-examine and he agrees with it's a hernia.  Assessment:      Abdominal wall hernia    Plan:     Refer to Dr Malvin Johns to evaluate Review handout on hernia and if has increased pain got to the ER   Follow up prn

## 2013-02-02 ENCOUNTER — Telehealth: Payer: Self-pay | Admitting: Adult Health

## 2013-02-02 NOTE — Telephone Encounter (Signed)
Pt saw Dr Malvin Johns 2 weeks ago and again today and she said she would like to see another surgeon ,that he told her to lose weight and then come back to fix hernia, that it would be like putting a band aide on it.she is going to call [facility] [place] as they did her appendix.and she will call back if needs referral.

## 2013-02-09 ENCOUNTER — Ambulatory Visit (INDEPENDENT_AMBULATORY_CARE_PROVIDER_SITE_OTHER): Payer: BC Managed Care – PPO | Admitting: Surgery

## 2013-06-30 ENCOUNTER — Telehealth: Payer: Self-pay | Admitting: Adult Health

## 2013-06-30 NOTE — Telephone Encounter (Signed)
Breasts are very tender and sore. Pt states they are both hard. Pt states that she has milk leak from breast from time to time. Pt states she is also having vaginal discharge. Unsure of LMP. Pt given appointment for tomorrow morning.

## 2013-07-01 ENCOUNTER — Encounter: Payer: Self-pay | Admitting: Adult Health

## 2013-07-01 ENCOUNTER — Ambulatory Visit (INDEPENDENT_AMBULATORY_CARE_PROVIDER_SITE_OTHER): Payer: BC Managed Care – PPO | Admitting: Adult Health

## 2013-07-01 VITALS — BP 128/88 | Ht 67.5 in | Wt 285.0 lb

## 2013-07-01 DIAGNOSIS — N912 Amenorrhea, unspecified: Secondary | ICD-10-CM

## 2013-07-01 DIAGNOSIS — N6459 Other signs and symptoms in breast: Secondary | ICD-10-CM

## 2013-07-01 DIAGNOSIS — Z3202 Encounter for pregnancy test, result negative: Secondary | ICD-10-CM

## 2013-07-01 DIAGNOSIS — N898 Other specified noninflammatory disorders of vagina: Secondary | ICD-10-CM

## 2013-07-01 DIAGNOSIS — N6452 Nipple discharge: Secondary | ICD-10-CM

## 2013-07-01 HISTORY — DX: Other specified noninflammatory disorders of vagina: N89.8

## 2013-07-01 HISTORY — DX: Nipple discharge: N64.52

## 2013-07-01 LAB — TSH: TSH: 1.919 u[IU]/mL (ref 0.350–4.500)

## 2013-07-01 LAB — POCT URINE PREGNANCY: Preg Test, Ur: NEGATIVE

## 2013-07-01 NOTE — Progress Notes (Signed)
Subjective:     Patient ID: Julie [name], female   DOB: 1976/01/20, 38 y.o.   MRN: [id number]  HPI Mirriam is a 38 year old white female in complaining of vaginal discharge"panties wet" and bilateral breast discharge and pain in UOQ.  Review of Systems See HPI Reviewed past medical,surgical, social and family history. Reviewed medications and allergies.     Objective:   Physical Exam BP 128/88  Ht 5' 7.5" (1.715 m)  Wt 285 lb (129.275 kg)  BMI 43.95 kg/m2  LMP 01/09/2015UPT negative.   Skin warm and dry.Pelvic: external genitalia is normal in appearance, vagina:clear mucous discharge, cervix:smooth and bulbous, uterus: normal size, shape and contour, non tender, no masses felt, adnexa: no masses or tenderness noted.  Breasts:no dominate palpable mass, retraction or nipple discharge, has tenderness UOQ bilaterally, I think she has ovulatory mucous today.   Assessment:    History of breast discharge  Vaginal discharge    Plan:     TSH and Prolactin level drawn before exam   Review handout on galactorrhea  Decrease caffeine and follow up labs next week Follow up prn

## 2013-07-01 NOTE — Patient Instructions (Signed)
Galactorrhea Galactorrhea is when there is a milky nipple discharge. It is different from normal milk in nursing mothers. It usually comes from both nipples. Galactorrhea is not a disease but may be a symptom of a problem. It may continue for years after weaning. Galactorrhea is caused by the hormone prolactin, which stimulates milk production. If the breast discharge looks like pus, is bloody or if there is a lump present in the affected breast, the discharge may be caused by other problems including:  A benign cyst.  Papilloma.  Breast cancer.  A breast infection.  A breast abscess. It can also be seen in men who have a low or absent female hormone (testosterone) level. Galactorrhea can be present in a newborn if the mother had high female hormone (estrogen) levels that crossed into the baby through the placenta. The baby usually has enlarged breasts, but in time, it all goes away on its own. CAUSES   Tumor of the pituitary gland in the brain.  Problems with the hypothalamus in the brain that stimulates the pituitary gland.  Low thyroid function (hypothyroid disease).  Chronic kidney failure.  Medications, antidepressants, tranquilizers and blood pressure medication.  Herbal medications (nettle, fennel, blessed thistle, anise and fenugreek seed).  Illegal drugs (marijuana and opiates).  Breast stimulation during sexual activity or too many and frequent self breast exams.  Birth control pills.  Surgery or trauma to the breast causing nerve damage.  Spinal cord injury. SYMPTOMS   White, yellow or green discharge from one or both breasts.  No menstrual period (amenorrhea) or infrequent menstrual periods (hypomenorrhea).  Hot flashes, lack of sexual desire or vaginal dryness.  Infertility in women and men.  Headaches and vision problems.  Decrease in calcium in your bones (developing osteopenia or osteoporosis). DIAGNOSIS  Your caregiver may be able to know your problem  by taking a detailed history and physical exam of you. Tests that may be done, include:  Blood tests to check for the prolactin hormone, your female and thyroid hormones and a pregnancy test.  A detailed eye exam.  Mammogram.  X-rays, CT scan or MRI of breasts or your brain looking for a tumor. TREATMENT   Stopping medications that may be causing the galactorrhea.  Treating low thyroid function with thyroid hormones.  Medical or surgical (if necessary) treatment of a pituitary gland tumor.  Medication to lower the prolactin hormone level when no cause can be found.  Surgery as a last resort to remove the breasts ducts if the discharge persists with treatment and is a problem.  Treatment may not be necessary if you are not bothered by the breast discharge. HOME CARE INSTRUCTIONS   Before seeing your caregiver, make a list of all your symptoms, medications, when the breast discharge started and questions you may have.  Avoid breast stimulation during sexual activity.  Perform breast self exam once a month.  Avoid clothes that rub on your nipples.  Use breasts pads to absorb the discharge.  Wear a support bra. SEEK MEDICAL CARE IF:   You have galactorrhea and you are trying to get pregnant.  You develop hot flashes, vaginal dryness or lack of sexual desire.  You stop having menstrual periods or they are irregular or far apart.  You have headaches.  You have vision problems. SEEK IMMEDIATE MEDICAL CARE IF:   Your breast discharge is bloody or pus-like.  You have breast pain.  You feel a lump in your breast.  Your breast shows wrinkling or   dimpling.  Your breast becomes red and swollen. Document Released: 07/03/2004 Document Revised: 08/18/2011 Document Reviewed: 05/16/2008 [facility] Patient Information 2014 [place]. Will follow up labs Monday

## 2013-07-02 LAB — PROLACTIN: Prolactin: 4.8 ng/mL

## 2013-07-04 ENCOUNTER — Telehealth: Payer: Self-pay | Admitting: Adult Health

## 2013-07-04 ENCOUNTER — Ambulatory Visit: Payer: BC Managed Care – PPO | Admitting: Adult Health

## 2013-07-04 NOTE — Telephone Encounter (Signed)
Pt aware of labs  

## 2013-11-15 ENCOUNTER — Emergency Department (HOSPITAL_COMMUNITY)
Admission: EM | Admit: 2013-11-15 | Discharge: 2013-11-15 | Disposition: A | Discharge status: Home / Self Care | Payer: BC Managed Care – PPO | Attending: Emergency Medicine | Admitting: Emergency Medicine

## 2013-11-15 ENCOUNTER — Emergency Department (HOSPITAL_COMMUNITY): Payer: BC Managed Care – PPO

## 2013-11-15 ENCOUNTER — Encounter (HOSPITAL_COMMUNITY): Payer: Self-pay | Admitting: Emergency Medicine

## 2013-11-15 DIAGNOSIS — Z87891 Personal history of nicotine dependence: Secondary | ICD-10-CM | POA: Insufficient documentation

## 2013-11-15 DIAGNOSIS — Z9889 Other specified postprocedural states: Secondary | ICD-10-CM | POA: Insufficient documentation

## 2013-11-15 DIAGNOSIS — F411 Generalized anxiety disorder: Secondary | ICD-10-CM | POA: Insufficient documentation

## 2013-11-15 DIAGNOSIS — F329 Major depressive disorder, single episode, unspecified: Secondary | ICD-10-CM | POA: Insufficient documentation

## 2013-11-15 DIAGNOSIS — Z79899 Other long term (current) drug therapy: Secondary | ICD-10-CM | POA: Insufficient documentation

## 2013-11-15 DIAGNOSIS — Z8742 Personal history of other diseases of the female genital tract: Secondary | ICD-10-CM | POA: Insufficient documentation

## 2013-11-15 DIAGNOSIS — Z87440 Personal history of urinary (tract) infections: Secondary | ICD-10-CM | POA: Insufficient documentation

## 2013-11-15 DIAGNOSIS — F3289 Other specified depressive episodes: Secondary | ICD-10-CM | POA: Insufficient documentation

## 2013-11-15 DIAGNOSIS — K219 Gastro-esophageal reflux disease without esophagitis: Secondary | ICD-10-CM | POA: Insufficient documentation

## 2013-11-15 DIAGNOSIS — Z862 Personal history of diseases of the blood and blood-forming organs and certain disorders involving the immune mechanism: Secondary | ICD-10-CM | POA: Insufficient documentation

## 2013-11-15 DIAGNOSIS — R079 Chest pain, unspecified: Secondary | ICD-10-CM | POA: Insufficient documentation

## 2013-11-15 LAB — CBC
HCT: 35.9 % — ABNORMAL LOW (ref 36.0–46.0)
Hemoglobin: 11.9 g/dL — ABNORMAL LOW (ref 12.0–15.0)
MCH: 24.8 pg — ABNORMAL LOW (ref 26.0–34.0)
MCHC: 33.1 g/dL (ref 30.0–36.0)
MCV: 74.8 fL — ABNORMAL LOW (ref 78.0–100.0)
Platelets: 222 10*3/uL (ref 150–400)
RBC: 4.8 MIL/uL (ref 3.87–5.11)
RDW: 14.5 % (ref 11.5–15.5)
WBC: 7.3 10*3/uL (ref 4.0–10.5)

## 2013-11-15 LAB — PROTIME-INR
INR: 1 (ref 0.00–1.49)
Prothrombin Time: 13 seconds (ref 11.6–15.2)

## 2013-11-15 LAB — COMPREHENSIVE METABOLIC PANEL
ALT: 9 U/L (ref 0–35)
AST: 12 U/L (ref 0–37)
Albumin: 3.6 g/dL (ref 3.5–5.2)
Alkaline Phosphatase: 68 U/L (ref 39–117)
BUN: 15 mg/dL (ref 6–23)
CO2: 26 mEq/L (ref 19–32)
Calcium: 9.4 mg/dL (ref 8.4–10.5)
Chloride: 103 mEq/L (ref 96–112)
Creatinine, Ser: 0.86 mg/dL (ref 0.50–1.10)
GFR calc Af Amer: >90 mL/min (ref >90)
GFR calc non Af Amer: 87 mL/min — ABNORMAL LOW (ref >90)
Glucose, Bld: 94 mg/dL (ref 70–99)
Potassium: 4.1 mEq/L (ref 3.7–5.3)
Sodium: 140 mEq/L (ref 137–147)
Total Bilirubin: <0.2 mg/dL — ABNORMAL LOW (ref 0.3–1.2)
Total Protein: 7.2 g/dL (ref 6.0–8.3)

## 2013-11-15 LAB — TROPONIN I
Troponin I: <0.3 ng/mL (ref <0.30)
Troponin I: <0.3 ng/mL (ref <0.30)

## 2013-11-15 LAB — MAGNESIUM: Magnesium: 2 mg/dL (ref 1.5–2.5)

## 2013-11-15 LAB — PRO B NATRIURETIC PEPTIDE: Pro B Natriuretic peptide (BNP): 93.5 pg/mL (ref 0–125)

## 2013-11-15 MED ORDER — ASPIRIN 81 MG PO CHEW
324.0000 mg | CHEWABLE_TABLET | Freq: Once | ORAL | Status: AC
  Administered 2013-11-15: 324 mg via ORAL
  Filled 2013-11-15: qty 4

## 2013-11-15 MED ORDER — RANITIDINE HCL 150 MG PO TABS: 150.0000 mg | ORAL_TABLET | Freq: Two times a day (BID) | ORAL | Status: DC

## 2013-11-15 NOTE — ED Notes (Signed)
Pt comes from home via EMS after c/o left side chest pain that began approximately 90 minutes ago. Pt describes pain as sharp. Pt states she became SOB earlier today before onset of chest pain. Pt also reports "heaviness" to left arm. Pt is A&Ox4 on arrival. Pt has 20g IV via EMS.

## 2013-11-15 NOTE — Discharge Instructions (Signed)
As discussed, it is important that you follow up as soon as possible with your physician for continued management of your condition.  If you develop any new, or concerning changes in your condition, please return to the emergency department immediately.  Chest Pain (Nonspecific) It is often hard to give a specific diagnosis for the cause of chest pain. There is always a chance that your pain could be related to something serious, such as a heart attack or a blood clot in the lungs. You need to follow up with your caregiver for further evaluation. CAUSES   Heartburn.  Pneumonia or bronchitis.  Anxiety or stress.  Inflammation around your heart (pericarditis) or lung (pleuritis or pleurisy).  A blood clot in the lung.  A collapsed lung (pneumothorax). It can develop suddenly on its own (spontaneous pneumothorax) or from injury (trauma) to the chest.  Shingles infection (herpes zoster virus). The chest wall is composed of bones, muscles, and cartilage. Any of these can be the source of the pain.  The bones can be bruised by injury.  The muscles or cartilage can be strained by coughing or overwork.  The cartilage can be affected by inflammation and become sore (costochondritis). DIAGNOSIS  Lab tests or other studies, such as X-rays, electrocardiography, stress testing, or cardiac imaging, may be needed to find the cause of your pain.  TREATMENT   Treatment depends on what may be causing your chest pain. Treatment may include:  Acid blockers for heartburn.  Anti-inflammatory medicine.  Pain medicine for inflammatory conditions.  Antibiotics if an infection is present.  You may be advised to change lifestyle habits. This includes stopping smoking and avoiding alcohol, caffeine, and chocolate.  You may be advised to keep your head raised (elevated) when sleeping. This reduces the chance of acid going backward from your stomach into your esophagus.  Most of the time, nonspecific  chest pain will improve within 2 to 3 days with rest and mild pain medicine. HOME CARE INSTRUCTIONS   If antibiotics were prescribed, take your antibiotics as directed. Finish them even if you start to feel better.  For the next few days, avoid physical activities that bring on chest pain. Continue physical activities as directed.  Do not smoke.  Avoid drinking alcohol.  Only take over-the-counter or prescription medicine for pain, discomfort, or fever as directed by your caregiver.  Follow your caregiver's suggestions for further testing if your chest pain does not go away.  Keep any follow-up appointments you made. If you do not go to an appointment, you could develop lasting (chronic) problems with pain. If there is any problem keeping an appointment, you must call to reschedule. SEEK MEDICAL CARE IF:   You think you are having problems from the medicine you are taking. Read your medicine instructions carefully.  Your chest pain does not go away, even after treatment.  You develop a rash with blisters on your chest. SEEK IMMEDIATE MEDICAL CARE IF:   You have increased chest pain or pain that spreads to your arm, neck, jaw, back, or abdomen.  You develop shortness of breath, an increasing cough, or you are coughing up blood.  You have severe back or abdominal pain, feel nauseous, or vomit.  You develop severe weakness, fainting, or chills.  You have a fever. THIS IS AN EMERGENCY. Do not wait to see if the pain will go away. Get medical help at once. Call your local emergency services (911 in [place] Do not drive yourself to the hospital.  MAKE SURE YOU:   Understand these instructions.  Will watch your condition.  Will get help right away if you are not doing well or get worse. Document Released: 03/05/2005 Document Revised: 08/18/2011 Document Reviewed: 12/30/2007 [facility] Patient Information 2014 [place].

## 2013-11-15 NOTE — ED Provider Notes (Signed)
CSN: [id number]     Arrival date & time 11/15/13  1745 History   First MD Initiated Contact with Patient 11/15/13 1753     Chief Complaint  Patient presents with  . Chest Pain     (Consider location/radiation/quality/duration/timing/severity/associated sxs/prior Treatment) HPI Patient presents with concern of left-sided chest pressure. Began approximately 1.5 hours prior to my evaluation.  The patient was waiting for her child from school when she developed pressure suddenly.  There was associated lightheadedness, some radiation down left arm, but no syncope, nausea, vomiting, other focal changes. Patient does not smoke, does not drink, is active, denies significant ongoing medical issues. There is no exertional or pleuritic pain  Past Medical History  Diagnosis Date  . Ectopic pregnancy   . Urinary tract infection   . Placenta previa 2012    Current pregnancy   . Depression   . Anxiety   . GERD (gastroesophageal reflux disease)     no meds  . Eczema   . Anemia   . Breast mass, right 10/20/2012  . Hernia of abdominal wall 01/04/2013  . Breast discharge 07/01/2013    Pt noticed discharge, none on exam today will check TSH and Prolactin  . Vaginal discharge 07/01/2013   Past Surgical History  Procedure Laterality Date  . Tonsillectomy    . Appendectomy    . Cholecystectomy    . Appendectomy    . Cesarean section  04/24/2011    Procedure: CESAREAN SECTION;  Surgeon: Florian Buff, MD;  Location: [facility];  Service: Gynecology;  Laterality: N/A;  Primary/Previa  . Breast biopsy Right    Family History  Problem Relation Age of Onset  . Depression Mother   . Cancer Mother   . Seizures Mother   . Hypertension Father   . Heart disease Maternal Grandmother   . Cancer Maternal Grandmother     oral  . Stroke Paternal Grandfather   . Heart disease Maternal Grandfather   . Cancer Paternal Grandmother     breast   History  Substance Use Topics  . Smoking status: Former Smoker     Types: Cigarettes    Quit date: 09/08/2010  . Smokeless tobacco: Never Used  . Alcohol Use: No   OB History   Grav Para Term Preterm Abortions TAB SAB Ect Mult Living   2 1 0 1 1 0 0 1 0 1      Review of Systems  Constitutional:       Per HPI, otherwise negative  HENT:       Per HPI, otherwise negative  Respiratory:       Per HPI, otherwise negative  Cardiovascular:       Per HPI, otherwise negative  Gastrointestinal: Negative for vomiting.  Endocrine:       Negative aside from HPI  Genitourinary:       Neg aside from HPI   Musculoskeletal:       Per HPI, otherwise negative  Skin: Negative.   Neurological: Negative for syncope.      Allergies  Clarithromycin  Home Medications   Prior to Admission medications   Medication Sig Start Date End Date Taking? Authorizing Provider  ALPRAZolam Duanne Moron) 0.25 MG tablet Take 0.25 mg by mouth at bedtime as needed for anxiety.    Historical Provider, MD  ranitidine (ZANTAC) 150 MG tablet Take 150 mg by mouth 2 (two) times daily.    Historical Provider, MD  sertraline (ZOLOFT) 50 MG tablet Take 100 mg by mouth  daily.      Historical Provider, MD   BP 121/51  Pulse 70  Temp(Src) 97.6 F (36.4 C) (Oral)  Resp 18  SpO2 100%  LMP 11/12/2013 Physical Exam  Nursing note and vitals reviewed. Constitutional: She is oriented to person, place, and time. She appears well-developed and well-nourished. No distress.  Large female resting in no distress  HENT:  Head: Normocephalic and atraumatic.  Eyes: Conjunctivae and EOM are normal.  Cardiovascular: Normal rate and regular rhythm.   Pulmonary/Chest: Effort normal and breath sounds normal. No stridor. No respiratory distress.  Abdominal: She exhibits no distension.  Musculoskeletal: She exhibits no edema.  Neurological: She is alert and oriented to person, place, and time. No cranial nerve deficit.  Skin: Skin is warm and dry.  Psychiatric: She has a normal mood and affect.     ED Course  Procedures (including critical care time) Labs Review Labs Reviewed  CBC - Abnormal; Notable for the following:    Hemoglobin 11.9 (*)    HCT 35.9 (*)    MCV 74.8 (*)    MCH 24.8 (*)    All other components within normal limits  COMPREHENSIVE METABOLIC PANEL - Abnormal; Notable for the following:    Total Bilirubin <0.2 (*)    GFR calc non Af Amer 87 (*)    All other components within normal limits  PRO B NATRIURETIC PEPTIDE  MAGNESIUM  PROTIME-INR  TROPONIN I  TROPONIN I    Imaging Review Dg Chest 2 View  11/15/2013   CLINICAL DATA:  Left-sided chest pain.  EXAM: CHEST  2 VIEW  COMPARISON:  11/21/2009  FINDINGS: Mild right hemidiaphragm elevation. Midline trachea. Normal heart size and mediastinal contours. No pleural effusion or pneumothorax. Clear lungs.  IMPRESSION: No acute cardiopulmonary disease.   Electronically Signed   By: Abigail Miyamoto M.D.   On: 11/15/2013 19:10     EKG Interpretation   Date/Time:  Tuesday November 15 2013 18:38:15 EDT Ventricular Rate:  62 PR Interval:  118 QRS Duration: 105 QT Interval:  373 QTC Calculation: 379 R Axis:   55 Text Interpretation:  Sinus rhythm Borderline short PR interval Low  voltage, precordial leads Sinus rhythm No significant change since last  tracing Normal ECG Confirmed by Carmin Muskrat  MD ([id number]) on 11/15/2013  6:53:37 PM     8:50 PM Patient is chest pain-free. Repeat troponin pending MDM   Patient presents after fell chest pain. Notably, patient's minimal risk profile for coronary artery disease, Hyung, does not smoke, is female, has no history of hypertension. Patient's evaluation here is reassuring, including negative troponin x2, EKG. Patient's pain resolved entirely.  With low risk profile, TIMI 0, she was d/c to f/u w PMD. Absent fever, cough there is low suspicion for occult infection.  The patient has no risk profile for PE either.    Carmin Muskrat, MD 11/15/13 2125

## 2014-04-10 ENCOUNTER — Encounter (HOSPITAL_COMMUNITY): Payer: Self-pay | Admitting: Emergency Medicine

## 2014-04-12 ENCOUNTER — Ambulatory Visit (INDEPENDENT_AMBULATORY_CARE_PROVIDER_SITE_OTHER): Payer: BC Managed Care – PPO | Admitting: Adult Health

## 2014-04-12 ENCOUNTER — Encounter: Payer: Self-pay | Admitting: Adult Health

## 2014-04-12 VITALS — BP 112/72 | Ht 67.5 in | Wt 289.5 lb

## 2014-04-12 DIAGNOSIS — N63 Unspecified lump in breast: Secondary | ICD-10-CM

## 2014-04-12 DIAGNOSIS — N6320 Unspecified lump in the left breast, unspecified quadrant: Secondary | ICD-10-CM

## 2014-04-12 DIAGNOSIS — J0100 Acute maxillary sinusitis, unspecified: Secondary | ICD-10-CM

## 2014-04-12 HISTORY — DX: Unspecified lump in the left breast, unspecified quadrant: N63.20

## 2014-04-12 MED ORDER — CEPHALEXIN 500 MG PO CAPS: 500.0000 mg | ORAL_CAPSULE | Freq: Four times a day (QID) | ORAL | Status: DC

## 2014-04-12 MED ORDER — FLUCONAZOLE 150 MG PO TABS: 150.0000 mg | ORAL_TABLET | Freq: Once | ORAL | Status: DC

## 2014-04-12 NOTE — Patient Instructions (Addendum)
Breast Cyst A breast cyst is a sac in the breast that is filled with fluid. Breast cysts are common in women. Women can have one or many cysts. When the breasts contain many cysts, it is usually due to a noncancerous (benign) condition called fibrocystic change. These lumps form under the influence of female hormones (estrogen and progesterone). The lumps are most often located in the upper, outer portion of the breast. They are often more swollen, painful, and tender before your period starts. They usually disappear after menopause, unless you are on hormone therapy.  There are several types of cysts:  Macrocyst. This is a cyst that is about 2 in. (5.1 cm) in diameter.   Microcyst. This is a tiny cyst that you cannot feel but can be seen with a mammogram or an ultrasound.   Galactocele. This is a cyst containing milk that may develop if you suddenly stop breastfeeding.   Sebaceous cyst of the skin. This type of cyst is not in the breast tissue itself. Breast cysts do not increase your risk of breast cancer. However, they must be monitored closely because they can be cancerous.  CAUSES  It is not known exactly what causes a breast cyst to form. Possible causes include:  An overgrowth of milk glands and connective tissue in the breast can block the milk glands, causing them to fill with fluid.   Scar tissue in the breast from previous surgery may block the glands, causing a cyst.  RISK FACTORS Estrogen may influence the development of a breast cyst.  SIGNS AND SYMPTOMS   Feeling a smooth, round, soft lump (like a grape) in the breast that is easily moveable.   Breast discomfort or pain.  Increase in size of the lump before your menstrual period and decrease in its size after your menstrual period.  DIAGNOSIS  A cyst can be felt during a physical exam by your health care provider. A breast X-ray exam (mammogram) and ultrasonography will be done to confirm the diagnosis. Fluid may  be removed from the cyst with a needle (fine needle aspiration) to make sure the cyst is not cancerous.  TREATMENT  Treatment may not be necessary. Your health care provider may monitor the cyst to see if it goes away on its own. If treatment is needed, it may include:  Hormone treatment.   Needle aspiration. There is a chance of the cyst coming back after aspiration.   Surgery to remove the whole cyst.  HOME CARE INSTRUCTIONS   Keep all follow-up appointments with your health care provider.  See your health care provider regularly:  Get a yearly exam by your health care provider.  Have a clinical breast exam by a health care provider every 1-3 years if you are 20-40 years of age. After age 40 years, you should have the exam every year.   Get mammogram tests as directed by your health care provider.   Understand the normal appearance and feel of your breasts and perform breast self-exams.   Only take over-the-counter or prescription medicines as directed by your health care provider.   Wear a supportive bra, especially when exercising.   Avoid caffeine.   Reduce your salt intake, especially before your menstrual period. Too much salt can cause fluid retention, breast swelling, and discomfort.  SEEK MEDICAL CARE IF:   You feel, or think you feel, a lump in your breast.   You notice that both breasts look or feel different than usual.   Your   breast is still causing pain after your menstrual period is over.   You need medicine for breast pain and swelling that occurs with your menstrual period.  SEEK IMMEDIATE MEDICAL CARE IF:   You have severe pain, tenderness, redness, or warmth in your breast.   You have nipple discharge or bleeding.   Your breast lump becomes hard and painful.   You find new lumps or bumps that were not there before.   You feel lumps in your armpit (axilla).   You notice dimpling or wrinkling of the breast or nipple.   You  have a fever.  MAKE SURE YOU:  Understand these instructions.  Will watch your condition.  Will get help right away if you are not doing well or get worse. Document Released: 05/26/2005 Document Revised: 01/26/2013 Document Reviewed: 12/23/2012 [facility] Patient Information 2015 [place], [place]. This information is not intended to replace advice given to you by your health care provider. Make sure you discuss any questions you have with your health care provider. Take keflex increase fluids Follow up prn Mammogram 11/13 at 11am

## 2014-04-12 NOTE — Progress Notes (Signed)
Subjective:     Patient ID: [name], female   DOB: 30-Jun-1975, 38 y.o.   MRN: [id number]  HPI Paysley is a 38 year old white female in complaining of painful left breast lump and sinus pressure with green mucous.No fever.The breast nodule hurts more with bra on.   Review of Systems See HPI Reviewed past medical,surgical, social and family history. Reviewed medications and allergies.     Objective:   Physical Exam BP 112/72 mmHg  Ht 5' 7.5" (1.715 m)  Wt 289 lb 8 oz (131.316 kg)  BMI 44.65 kg/m2  LMP 04/08/2014 (Exact Date)  Skin warm and dry,  Breasts:no dominate palpable mass, retraction or nipple discharge on the right, on the left no retraction or nipple discharge, has tender,mobile nodule at 6 o'clock near areola.Has maxillary sinus tenderness.    Assessment:     Left breast nodule   Sinus infection  Plan:     Diagnostic bilateral mammogram and left breast US 11/13 at [facility]   Rx Keflex 500 mg 1 qid x 7 days #28 no refills Increase fluids Rx diflucan 150 mg #1 with 1 refill,take if needed for yeast with antibiotics Review handout on breast cyst

## 2014-04-21 ENCOUNTER — Ambulatory Visit
Admission: RE | Admit: 2014-04-21 | Discharge: 2014-04-21 | Disposition: A | Discharge status: Home / Self Care | Payer: BC Managed Care – PPO | Source: Ambulatory Visit | Attending: Adult Health | Admitting: Adult Health

## 2014-04-21 DIAGNOSIS — N6320 Unspecified lump in the left breast, unspecified quadrant: Secondary | ICD-10-CM

## 2014-05-21 ENCOUNTER — Ambulatory Visit (HOSPITAL_BASED_OUTPATIENT_CLINIC_OR_DEPARTMENT_OTHER): Payer: BC Managed Care – PPO | Attending: Internal Medicine

## 2014-05-21 VITALS — Ht 67.0 in | Wt 280.0 lb

## 2014-05-21 DIAGNOSIS — G4730 Sleep apnea, unspecified: Secondary | ICD-10-CM | POA: Diagnosis present

## 2014-05-21 DIAGNOSIS — R0683 Snoring: Secondary | ICD-10-CM | POA: Diagnosis not present

## 2014-05-27 DIAGNOSIS — R0683 Snoring: Secondary | ICD-10-CM

## 2014-05-27 NOTE — Sleep Study (Signed)
   NAME: Julie [name] DATE OF BIRTH:  November 17, 1975 MEDICAL RECORD NUMBER [id number]  LOCATION: [facility]  PHYSICIAN: Rifka Ramey D  DATE OF STUDY: 05/21/2014  SLEEP STUDY TYPE: Nocturnal Polysomnogram               REFERRING PHYSICIAN: Orie Rout, MD  INDICATION FOR STUDY: hypersomnia with sleep apnea  EPWORTH SLEEPINESS SCORE:   18/24 HEIGHT: $RemoveBefor'5\' 7"'rLbSVAwnqLYF$  (170.2 cm)  WEIGHT: 280 lb (127.007 kg)    Body mass index is 43.84 kg/(m^2).  NECK SIZE: 14 in.  MEDICATIONS: Charted for review  SLEEP ARCHITECTURE: total sleep time 385.5 minutes with sleep efficiency 94%. Stage I was 8%, stage II 78.9%, stage III 0.5%, REM 12.6% of total sleep time. Sleep latency 17 minutes, REM latency 301.5 minutes, awake after sleep onset 8 minutes, arousal index 21.2. No bedtime medication  RESPIRATORY DATA: apnea hypopnea index (AHI) 0.0 per hour. No respiratory events met scoring criteria.  OXYGEN DATA: very loud snoring with oxygen desaturation to a nadir of 92% and mean saturation 95.9% on room air.  CARDIAC DATA: normal sinus rhythm  MOVEMENT/PARASOMNIA: 40 limb jerks were scored, including 3 associated with arousal or awakening, for a periodic limb movement with arousal index of 0.5 per hour. No bathroom trips  IMPRESSION/ RECOMMENDATION:   1) Loud snoring but no respiratory events causing sleep disturbance to meet scoring criteria. AHI 0.0 per hour. Oxygen desaturation to a nadir of 92% and mean saturation 95.9% on room air.  Deneise Lever Diplomate, American Board of Sleep Medicine  ELECTRONICALLY SIGNED ON:  05/27/2014, 12:53 PM [place] PH: [phone]   FX: [phone] [place]

## 2014-06-21 ENCOUNTER — Encounter (HOSPITAL_COMMUNITY): Payer: Self-pay | Admitting: Emergency Medicine

## 2014-06-21 ENCOUNTER — Emergency Department (HOSPITAL_COMMUNITY): Payer: 59

## 2014-06-21 ENCOUNTER — Emergency Department (HOSPITAL_COMMUNITY)
Admission: EM | Admit: 2014-06-21 | Discharge: 2014-06-21 | Disposition: A | Discharge status: Home / Self Care | Payer: 59 | Attending: Emergency Medicine | Admitting: Emergency Medicine

## 2014-06-21 DIAGNOSIS — R109 Unspecified abdominal pain: Secondary | ICD-10-CM | POA: Diagnosis present

## 2014-06-21 DIAGNOSIS — Z79899 Other long term (current) drug therapy: Secondary | ICD-10-CM | POA: Insufficient documentation

## 2014-06-21 DIAGNOSIS — Z8719 Personal history of other diseases of the digestive system: Secondary | ICD-10-CM | POA: Insufficient documentation

## 2014-06-21 DIAGNOSIS — F329 Major depressive disorder, single episode, unspecified: Secondary | ICD-10-CM | POA: Diagnosis not present

## 2014-06-21 DIAGNOSIS — F419 Anxiety disorder, unspecified: Secondary | ICD-10-CM | POA: Insufficient documentation

## 2014-06-21 DIAGNOSIS — Z872 Personal history of diseases of the skin and subcutaneous tissue: Secondary | ICD-10-CM | POA: Insufficient documentation

## 2014-06-21 DIAGNOSIS — N200 Calculus of kidney: Secondary | ICD-10-CM | POA: Insufficient documentation

## 2014-06-21 DIAGNOSIS — Z792 Long term (current) use of antibiotics: Secondary | ICD-10-CM | POA: Diagnosis not present

## 2014-06-21 DIAGNOSIS — Z3202 Encounter for pregnancy test, result negative: Secondary | ICD-10-CM | POA: Insufficient documentation

## 2014-06-21 DIAGNOSIS — Z87891 Personal history of nicotine dependence: Secondary | ICD-10-CM | POA: Diagnosis not present

## 2014-06-21 DIAGNOSIS — Z862 Personal history of diseases of the blood and blood-forming organs and certain disorders involving the immune mechanism: Secondary | ICD-10-CM | POA: Diagnosis not present

## 2014-06-21 LAB — CBC WITH DIFFERENTIAL/PLATELET
Basophils Absolute: 0 10*3/uL (ref 0.0–0.1)
Basophils Relative: 0 % (ref 0–1)
Eosinophils Absolute: 0.2 10*3/uL (ref 0.0–0.7)
Eosinophils Relative: 2 % (ref 0–5)
HCT: 34.9 % — ABNORMAL LOW (ref 36.0–46.0)
Hemoglobin: 11.3 g/dL — ABNORMAL LOW (ref 12.0–15.0)
Lymphocytes Relative: 40 % (ref 12–46)
Lymphs Abs: 3.1 10*3/uL (ref 0.7–4.0)
MCH: 24.1 pg — ABNORMAL LOW (ref 26.0–34.0)
MCHC: 32.4 g/dL (ref 30.0–36.0)
MCV: 74.4 fL — ABNORMAL LOW (ref 78.0–100.0)
Monocytes Absolute: 0.4 10*3/uL (ref 0.1–1.0)
Monocytes Relative: 5 % (ref 3–12)
Neutro Abs: 4 10*3/uL (ref 1.7–7.7)
Neutrophils Relative %: 53 % (ref 43–77)
Platelets: 267 10*3/uL (ref 150–400)
RBC: 4.69 MIL/uL (ref 3.87–5.11)
RDW: 14.1 % (ref 11.5–15.5)
WBC: 7.7 10*3/uL (ref 4.0–10.5)

## 2014-06-21 LAB — URINALYSIS, ROUTINE W REFLEX MICROSCOPIC
Bilirubin Urine: NEGATIVE
Glucose, UA: NEGATIVE mg/dL
Ketones, ur: NEGATIVE mg/dL
Nitrite: NEGATIVE
Protein, ur: NEGATIVE mg/dL
Specific Gravity, Urine: 1.01 (ref 1.005–1.030)
Urobilinogen, UA: 0.2 mg/dL (ref 0.0–1.0)
pH: 6.5 (ref 5.0–8.0)

## 2014-06-21 LAB — COMPREHENSIVE METABOLIC PANEL
ALT: 16 U/L (ref 0–35)
AST: 16 U/L (ref 0–37)
Albumin: 4.2 g/dL (ref 3.5–5.2)
Alkaline Phosphatase: 55 U/L (ref 39–117)
Anion gap: 7 (ref 5–15)
BUN: 9 mg/dL (ref 6–23)
CO2: 25 mmol/L (ref 19–32)
Calcium: 9.1 mg/dL (ref 8.4–10.5)
Chloride: 105 mEq/L (ref 96–112)
Creatinine, Ser: 0.62 mg/dL (ref 0.50–1.10)
GFR calc Af Amer: >90 mL/min (ref >90)
GFR calc non Af Amer: >90 mL/min (ref >90)
Glucose, Bld: 91 mg/dL (ref 70–99)
Potassium: 3.6 mmol/L (ref 3.5–5.1)
Sodium: 137 mmol/L (ref 135–145)
Total Bilirubin: 0.5 mg/dL (ref 0.3–1.2)
Total Protein: 7.4 g/dL (ref 6.0–8.3)

## 2014-06-21 LAB — URINE MICROSCOPIC-ADD ON

## 2014-06-21 LAB — PREGNANCY, URINE: Preg Test, Ur: NEGATIVE

## 2014-06-21 MED ORDER — ONDANSETRON HCL 4 MG/2ML IJ SOLN
4.0000 mg | Freq: Once | INTRAMUSCULAR | Status: AC
  Administered 2014-06-21: 4 mg via INTRAVENOUS
  Filled 2014-06-21: qty 2

## 2014-06-21 MED ORDER — MORPHINE SULFATE 4 MG/ML IJ SOLN
4.0000 mg | Freq: Once | INTRAMUSCULAR | Status: AC
Start: 2014-06-21 — End: 2014-06-21
  Administered 2014-06-21: 4 mg via INTRAVENOUS
  Filled 2014-06-21: qty 1

## 2014-06-21 MED ORDER — OXYCODONE-ACETAMINOPHEN 5-325 MG PO TABS: 1.0000 | ORAL_TABLET | Freq: Four times a day (QID) | ORAL | Status: DC | PRN

## 2014-06-21 MED ORDER — ONDANSETRON 4 MG PO TBDP: 4.0000 mg | ORAL_TABLET | Freq: Three times a day (TID) | ORAL | Status: DC | PRN

## 2014-06-21 MED ORDER — KETOROLAC TROMETHAMINE 30 MG/ML IJ SOLN
30.0000 mg | Freq: Once | INTRAMUSCULAR | Status: AC
  Administered 2014-06-21: 30 mg via INTRAVENOUS
  Filled 2014-06-21: qty 1

## 2014-06-21 MED ORDER — TAMSULOSIN HCL 0.4 MG PO CAPS: 0.4000 mg | ORAL_CAPSULE | Freq: Every day | ORAL | Status: DC

## 2014-06-21 MED ORDER — IBUPROFEN 800 MG PO TABS: 800.0000 mg | ORAL_TABLET | Freq: Three times a day (TID) | ORAL | Status: DC | PRN

## 2014-06-21 MED ORDER — SODIUM CHLORIDE 0.9 % IV BOLUS (SEPSIS)
1000.0000 mL | Freq: Once | INTRAVENOUS | Status: AC
  Administered 2014-06-21: 1000 mL via INTRAVENOUS

## 2014-06-21 NOTE — ED Notes (Signed)
Patient c/o left flank pain that started today.  Patient states has had burning with urination x 4 days.

## 2014-06-21 NOTE — ED Provider Notes (Signed)
TIME SEEN: 8:25 PM  CHIEF COMPLAINT: Left flank pain that started today, and dysuria for the past 4 days, nausea  HPI: Pt is a 39 y.o. female with history of depression, GERD who presents emergency department complaints of dysuria for the past 4 days and left flank pain that started today with nausea. No fevers or chills. No hematuria. No back injury. No numbness currently or focal weakness. No vomiting or diarrhea. No vaginal bleeding or discharge. Has never had a kidney stone before. Is status post C-section, appendectomy and cholecystectomy.  ROS: See HPI Constitutional: no fever  Eyes: no drainage  ENT: no runny nose   Cardiovascular:  no chest pain  Resp: no SOB  GI: no vomiting GU: no dysuria Integumentary: no rash  Allergy: no hives  Musculoskeletal: no leg swelling  Neurological: no slurred speech ROS otherwise negative  PAST MEDICAL HISTORY/PAST SURGICAL HISTORY:  Past Medical History  Diagnosis Date  . Ectopic pregnancy   . Urinary tract infection   . Placenta previa 2012    Current pregnancy   . Depression   . Anxiety   . GERD (gastroesophageal reflux disease)     no meds  . Eczema   . Anemia   . Breast mass, right 10/20/2012  . Hernia of abdominal wall 01/04/2013  . Breast discharge 07/01/2013    Pt noticed discharge, none on exam today will check TSH and Prolactin  . Vaginal discharge 07/01/2013  . Headache   . Left breast mass 04/12/2014    Has pea sized nodule at 6 o'clock tender and mobile, will get mammogram and [place] if needed    MEDICATIONS:  Prior to Admission medications   Medication Sig Start Date End Date Taking? Authorizing Provider  ALPRAZolam Duanne Moron) 1 MG tablet Take 0.5-1 mg by mouth 3 (three) times daily as needed for anxiety.    Historical Provider, MD  amitriptyline (ELAVIL) 25 MG tablet at bedtime.  04/11/14   Historical Provider, MD  butalbital-acetaminophen-caffeine (FIORICET WITH CODEINE) 50-325-40-30 MG per capsule 1 capsule every 6 (six)  hours as needed.  03/09/14   Historical Provider, MD  cephALEXin (KEFLEX) 500 MG capsule Take 1 capsule (500 mg total) by mouth 4 (four) times daily. 04/12/14   Estill Dooms, NP  fluconazole (DIFLUCAN) 150 MG tablet Take 1 tablet (150 mg total) by mouth once. 04/12/14   Estill Dooms, NP  ibuprofen (ADVIL,MOTRIN) 200 MG tablet Take 600 mg by mouth every 6 (six) hours as needed.    Historical Provider, MD  ranitidine (ZANTAC) 150 MG tablet Take 1 tablet (150 mg total) by mouth 2 (two) times daily. 11/15/13   Carmin Muskrat, MD  sertraline (ZOLOFT) 100 MG tablet Take 100 mg by mouth every morning.    Historical Provider, MD    ALLERGIES:  Allergies  Allergen Reactions  . Clarithromycin Other (See Comments)    Makes throat raw    SOCIAL HISTORY:  History  Substance Use Topics  . Smoking status: Former Smoker    Types: Cigarettes    Quit date: 09/08/2010  . Smokeless tobacco: Never Used  . Alcohol Use: Yes     Comment: occ mixed drink    FAMILY HISTORY: Family History  Problem Relation Age of Onset  . Depression Mother   . Cancer Mother   . Seizures Mother   . Bipolar disorder Mother   . Hypertension Father   . Heart disease Maternal Grandmother   . Cancer Maternal Grandmother     oral  .  Hypertension Maternal Grandmother   . Other Maternal Grandmother     memory problems  . Stroke Paternal Grandfather   . Hypertension Paternal Grandfather   . Other Paternal Grandfather     brain tumors  . Heart disease Maternal Grandfather   . Pancreatitis Maternal Grandfather   . Cancer Paternal Grandmother     breast  . Heart disease Paternal Grandmother   . Kidney disease Paternal Grandmother     kidney failure  . Diabetes Paternal Grandmother     EXAM: BP 117/64 mmHg  Pulse 84  Temp(Src)   Resp 20  Ht 5\' 7"  (1.702 m)  Wt 284 lb (128.822 kg)  BMI 44.47 kg/m2  SpO2 100%  LMP 06/14/2014 (Approximate) CONSTITUTIONAL: Alert and oriented and responds appropriately to  questions. Well-appearing; well-nourished HEAD: Normocephalic EYES: Conjunctivae clear, PERRL ENT: normal nose; no rhinorrhea; moist mucous membranes; pharynx without lesions noted NECK: Supple, no meningismus, no LAD  CARD: RRR; S1 and S2 appreciated; no murmurs, no clicks, no rubs, no gallops RESP: Normal chest excursion without splinting or tachypnea; breath sounds clear and equal bilaterally; no wheezes, no rhonchi, no rales,  ABD/GI: Normal bowel sounds; non-distended; soft, non-tender, no rebound, no guarding BACK:  The back appears normal and is non-tender to palpation, small amount of left-sided CVA tenderness, no midline spinal stenosis above or deformity EXT: Normal ROM in all joints; non-tender to palpation; no edema; normal capillary refill; no cyanosis    SKIN: Normal color for age and race; warm NEURO: Moves all extremities equally, sensation to light touch intact diffusely PSYCH: The patient's mood and manner are appropriate. Grooming and personal hygiene are appropriate.  MEDICAL DECISION MAKING: Patient here with left flank pain. Concern for kidney stone. Will obtain labs, urinalysis, CT of her abdomen and pelvis. We'll give morphine, Toradol, Zofran, IV fluids.  ED PROGRESS: Labs unremarkable. Normal creatinine. Urine shows moderate hemoglobin small leukocytes but rare bacteria. CT scan shows a partially obstructing 4 mm calculus proximally within the lower pole of a duplicated left ureter. We'll discharge with Percocet, ibuprofen, Zofran and Flomax. We'll give urine strainer. We'll give all patient urology follow-up information. She reports she is feeling much better in the ED. Discussed strict return precautions and supportive care instructions. Have provided information for how to change her diet to prevent kidney stones. Instructed her to increase water intake. She verbalized understanding and is comfortable with plan.     [place], DO 06/21/14 2156

## 2014-06-21 NOTE — Discharge Instructions (Signed)

## 2014-08-02 ENCOUNTER — Ambulatory Visit (HOSPITAL_BASED_OUTPATIENT_CLINIC_OR_DEPARTMENT_OTHER): Payer: BC Managed Care – PPO

## 2014-08-04 ENCOUNTER — Encounter (HOSPITAL_COMMUNITY)
Admission: EM | Disposition: A | Discharge status: Home / Self Care | Payer: Self-pay | Source: Ambulatory Visit | Attending: Urology

## 2014-08-04 ENCOUNTER — Encounter (HOSPITAL_COMMUNITY): Payer: Self-pay | Admitting: *Deleted

## 2014-08-04 ENCOUNTER — Other Ambulatory Visit: Payer: Self-pay | Admitting: Urology

## 2014-08-04 ENCOUNTER — Ambulatory Visit (HOSPITAL_COMMUNITY): Payer: 59 | Admitting: Anesthesiology

## 2014-08-04 ENCOUNTER — Ambulatory Visit (HOSPITAL_COMMUNITY)
Admission: EM | Admit: 2014-08-04 | Discharge: 2014-08-04 | Disposition: A | Discharge status: Home / Self Care | Payer: 59 | Source: Ambulatory Visit | Attending: Urology | Admitting: Urology

## 2014-08-04 DIAGNOSIS — Z888 Allergy status to other drugs, medicaments and biological substances status: Secondary | ICD-10-CM | POA: Insufficient documentation

## 2014-08-04 DIAGNOSIS — K219 Gastro-esophageal reflux disease without esophagitis: Secondary | ICD-10-CM | POA: Insufficient documentation

## 2014-08-04 DIAGNOSIS — F419 Anxiety disorder, unspecified: Secondary | ICD-10-CM | POA: Diagnosis not present

## 2014-08-04 DIAGNOSIS — Z9049 Acquired absence of other specified parts of digestive tract: Secondary | ICD-10-CM | POA: Diagnosis not present

## 2014-08-04 DIAGNOSIS — Z87891 Personal history of nicotine dependence: Secondary | ICD-10-CM | POA: Diagnosis not present

## 2014-08-04 DIAGNOSIS — N201 Calculus of ureter: Secondary | ICD-10-CM | POA: Insufficient documentation

## 2014-08-04 DIAGNOSIS — F329 Major depressive disorder, single episode, unspecified: Secondary | ICD-10-CM | POA: Diagnosis not present

## 2014-08-04 HISTORY — PX: CYSTOSCOPY W/ URETERAL STENT PLACEMENT: SHX1429

## 2014-08-04 LAB — HCG, SERUM, QUALITATIVE: Preg, Serum: NEGATIVE

## 2014-08-04 SURGERY — CYSTOSCOPY, WITH RETROGRADE PYELOGRAM AND URETERAL STENT INSERTION
Anesthesia: General | Site: Ureter | Laterality: Left

## 2014-08-04 MED ORDER — CIPROFLOXACIN HCL 500 MG PO TABS: 500.0000 mg | ORAL_TABLET | Freq: Two times a day (BID) | ORAL | Status: DC

## 2014-08-04 MED ORDER — LIDOCAINE HCL 2 % EX GEL
CUTANEOUS | Status: DC | PRN
  Administered 2014-08-04: 1 via URETHRAL

## 2014-08-04 MED ORDER — DEXAMETHASONE SODIUM PHOSPHATE 10 MG/ML IJ SOLN
INTRAMUSCULAR | Status: DC | PRN
  Administered 2014-08-04: 10 mg via INTRAVENOUS

## 2014-08-04 MED ORDER — OXYCODONE-ACETAMINOPHEN 5-325 MG PO TABS
1.0000 | ORAL_TABLET | Freq: Four times a day (QID) | ORAL | Status: DC | PRN
Start: 2014-08-04 — End: 2015-09-19

## 2014-08-04 MED ORDER — MIDAZOLAM HCL 5 MG/5ML IJ SOLN
INTRAMUSCULAR | Status: DC | PRN
  Administered 2014-08-04 (×2): 1 mg via INTRAVENOUS

## 2014-08-04 MED ORDER — CISATRACURIUM BESYLATE 20 MG/10ML IV SOLN
INTRAVENOUS | Status: AC
  Filled 2014-08-04: qty 10

## 2014-08-04 MED ORDER — ACETAMINOPHEN 10 MG/ML IV SOLN
1000.0000 mg | Freq: Once | INTRAVENOUS | Status: AC
  Administered 2014-08-04: 1000 mg via INTRAVENOUS
  Filled 2014-08-04: qty 100

## 2014-08-04 MED ORDER — LIDOCAINE HCL (CARDIAC) 20 MG/ML IV SOLN
INTRAVENOUS | Status: AC
  Filled 2014-08-04: qty 5

## 2014-08-04 MED ORDER — CIPROFLOXACIN IN D5W 400 MG/200ML IV SOLN
INTRAVENOUS | Status: AC
  Filled 2014-08-04: qty 200

## 2014-08-04 MED ORDER — BELLADONNA ALKALOIDS-OPIUM 16.2-60 MG RE SUPP
RECTAL | Status: AC
  Filled 2014-08-04: qty 1

## 2014-08-04 MED ORDER — SCOPOLAMINE 1 MG/3DAYS TD PT72
MEDICATED_PATCH | TRANSDERMAL | Status: AC
  Filled 2014-08-04: qty 1

## 2014-08-04 MED ORDER — 0.9 % SODIUM CHLORIDE (POUR BTL) OPTIME
TOPICAL | Status: DC | PRN
  Administered 2014-08-04: 1000 mL

## 2014-08-04 MED ORDER — PROPOFOL 10 MG/ML IV BOLUS
INTRAVENOUS | Status: AC
Start: 2014-08-04 — End: 2014-08-04
  Filled 2014-08-04: qty 20

## 2014-08-04 MED ORDER — PROMETHAZINE HCL 25 MG/ML IJ SOLN
6.2500 mg | INTRAMUSCULAR | Status: AC
  Administered 2014-08-04: 6.25 mg via INTRAVENOUS
  Filled 2014-08-04: qty 1

## 2014-08-04 MED ORDER — FENTANYL CITRATE 0.05 MG/ML IJ SOLN
INTRAMUSCULAR | Status: AC
  Filled 2014-08-04: qty 5

## 2014-08-04 MED ORDER — ONDANSETRON HCL 4 MG/2ML IJ SOLN
INTRAMUSCULAR | Status: DC | PRN
  Administered 2014-08-04: 4 mg via INTRAVENOUS

## 2014-08-04 MED ORDER — IOHEXOL 300 MG/ML  SOLN
INTRAMUSCULAR | Status: DC | PRN
  Administered 2014-08-04: 10 mL

## 2014-08-04 MED ORDER — LACTATED RINGERS IV SOLN
INTRAVENOUS | Status: DC
  Administered 2014-08-04: 17:00:00 via INTRAVENOUS

## 2014-08-04 MED ORDER — PROPOFOL 10 MG/ML IV BOLUS
INTRAVENOUS | Status: AC
  Filled 2014-08-04: qty 20

## 2014-08-04 MED ORDER — SUCCINYLCHOLINE CHLORIDE 20 MG/ML IJ SOLN
INTRAMUSCULAR | Status: DC | PRN
  Administered 2014-08-04: 120 mg via INTRAVENOUS

## 2014-08-04 MED ORDER — FENTANYL CITRATE 0.05 MG/ML IJ SOLN
INTRAMUSCULAR | Status: DC | PRN
  Administered 2014-08-04: 50 ug via INTRAVENOUS

## 2014-08-04 MED ORDER — DEXAMETHASONE SODIUM PHOSPHATE 10 MG/ML IJ SOLN
INTRAMUSCULAR | Status: AC
  Filled 2014-08-04: qty 1

## 2014-08-04 MED ORDER — LIDOCAINE HCL (CARDIAC) 20 MG/ML IV SOLN
INTRAVENOUS | Status: DC | PRN
  Administered 2014-08-04: 100 mg via INTRAVENOUS

## 2014-08-04 MED ORDER — HYDROMORPHONE HCL 1 MG/ML IJ SOLN
0.5000 mg | INTRAMUSCULAR | Status: AC
  Administered 2014-08-04 (×2): 0.5 mg via INTRAVENOUS
  Filled 2014-08-04: qty 1

## 2014-08-04 MED ORDER — SODIUM CHLORIDE 0.9 % IR SOLN
Status: DC | PRN
  Administered 2014-08-04: 3000 mL

## 2014-08-04 MED ORDER — MIDAZOLAM HCL 2 MG/2ML IJ SOLN
INTRAMUSCULAR | Status: AC
  Filled 2014-08-04: qty 2

## 2014-08-04 MED ORDER — PROPOFOL 10 MG/ML IV BOLUS
INTRAVENOUS | Status: DC | PRN
  Administered 2014-08-04: 250 mg via INTRAVENOUS

## 2014-08-04 MED ORDER — CIPROFLOXACIN IN D5W 400 MG/200ML IV SOLN
400.0000 mg | INTRAVENOUS | Status: AC
  Administered 2014-08-04: 400 mg via INTRAVENOUS

## 2014-08-04 MED ORDER — ONDANSETRON HCL 4 MG/2ML IJ SOLN
INTRAMUSCULAR | Status: AC
  Filled 2014-08-04: qty 2

## 2014-08-04 MED ORDER — SCOPOLAMINE 1 MG/3DAYS TD PT72
MEDICATED_PATCH | TRANSDERMAL | Status: DC | PRN
  Administered 2014-08-04: 1 via TRANSDERMAL

## 2014-08-04 MED ORDER — LIDOCAINE HCL 2 % EX GEL
CUTANEOUS | Status: AC
  Filled 2014-08-04: qty 10

## 2014-08-04 SURGICAL SUPPLY — 36 items
BAG URO CATCHER STRL LF (DRAPE) ×2 IMPLANT
BASKET LASER NITINOL 1.9FR (BASKET) IMPLANT
BASKET STNLS GEMINI 4WIRE 3FR (BASKET) IMPLANT
BASKET ZERO TIP NITINOL 2.4FR (BASKET) IMPLANT
BRIEF STRETCH FOR OB PAD LRG (UNDERPADS AND DIAPERS) ×1 IMPLANT
BRUSH URET BIOPSY 3F (UROLOGICAL SUPPLIES) IMPLANT
BSKT STON RTRVL 120 1.9FR (BASKET)
BSKT STON RTRVL GEM 120X11 3FR (BASKET)
BSKT STON RTRVL ZERO TP 2.4FR (BASKET)
CATH INTERMIT  6FR 70CM (CATHETERS) ×1 IMPLANT
CLOTH BEACON ORANGE TIMEOUT ST (SAFETY) ×2 IMPLANT
FIBER LASER FLEXIVA 1000 (UROLOGICAL SUPPLIES) IMPLANT
FIBER LASER FLEXIVA 200 (UROLOGICAL SUPPLIES) IMPLANT
FIBER LASER FLEXIVA 365 (UROLOGICAL SUPPLIES) IMPLANT
FIBER LASER FLEXIVA 550 (UROLOGICAL SUPPLIES) IMPLANT
FIBER LASER TRAC TIP (UROLOGICAL SUPPLIES) IMPLANT
GLOVE BIO SURGEON STRL SZ7.5 (GLOVE) ×1 IMPLANT
GLOVE BIOGEL M STRL SZ7.5 (GLOVE) ×2 IMPLANT
GLOVE BIOGEL PI IND STRL 7.5 (GLOVE) IMPLANT
GLOVE BIOGEL PI INDICATOR 7.5 (GLOVE) ×1
GOWN STRL REUS W/TWL XL LVL3 (GOWN DISPOSABLE) ×3 IMPLANT
GUIDEWIRE ANG ZIPWIRE 038X150 (WIRE) ×1 IMPLANT
GUIDEWIRE STR DUAL SENSOR (WIRE) IMPLANT
IV NS IRRIG 3000ML ARTHROMATIC (IV SOLUTION) ×3 IMPLANT
KIT BALLIN UROMAX 15FX10 (LABEL) IMPLANT
KIT BALLN UROMAX 15FX4 (MISCELLANEOUS) IMPLANT
KIT BALLN UROMAX 26 75X4 (MISCELLANEOUS)
NS IRRIG 1000ML POUR BTL (IV SOLUTION) ×1 IMPLANT
PACK CYSTO (CUSTOM PROCEDURE TRAY) ×3 IMPLANT
PAD OB MATERNITY 4.3X12.25 (PERSONAL CARE ITEMS) ×1 IMPLANT
SET HIGH PRES BAL DIL (LABEL)
SHEATH ACCESS URETERAL 24CM (SHEATH) IMPLANT
SHEATH ACCESS URETERAL 54CM (SHEATH) IMPLANT
SHIELD EYE BINOCULAR (MISCELLANEOUS) IMPLANT
STENT CONTOUR 6FRX26X.038 (STENTS) ×1 IMPLANT
SYRINGE IRR TOOMEY STRL 70CC (SYRINGE) IMPLANT

## 2014-08-04 NOTE — Op Note (Signed)
Preoperative diagnosis: Left proximal ureteral stone Postoperative diagnosis: Left proximal ureteral stone in a lower pole infundibulum of a bifid system  Procedure: Cystoscopy, left retrograde pyelogram, left ureteral stent placement  Surgeon: Shey Bartmess  Type of anesthesia: Gen.  Indication for procedure: Patient has had left flank pain. She has a known left proximal ureteral stone. She continued to have left flank pain and has not seen the stone pass. Also she had low-grade fevers today was brought urgently for ureteral stent. She remained stable throughout the day. She was given Rocephin in the office.  Findings: Bladder unremarkable. No stones. Get efflux from the right ureter.  On scout imaging possible opacity in the right ureter. On CT this was clips from likely appendectomy.  Left retrograde pyelogram demonstrated a bifid system with a Y-shaped proximal ureter leading to the upper pole and one long infundibulum leading to the lower pole and midpole calyces. I did not appreciate stone on the scout image but there may have been a faint filling defect in the middle of the lower pole ureter with some mild proximal dilation of the remainder of the ureter and collecting system of this lower pole side. The remainder of the ureter and as well as the infundibulum and electing system appeared normal without filling defect stricture or dilation.  On careful review of the CT again indeed the stone was in the lower pole infundibulum/inferior ureter.  Description of procedure: After consent was obtained patient brought to the operating room. After adequate anesthesia she is placed in lithotomy position and prepped and draped in the usual sterile fashion. A timeout was performed to confirm the patient and procedure. Cystoscope was passed per urethra and left ureter cannulated with a 6 [place] open-ended catheter. Left retrograde injection of contrast was performed which confirmed a bifid left proximal  system. Careful review the CT revealed stone in the lower/inferior ureter. Therefore the 6 [place] open-ended catheter was advanced and used to direct a Glidewire angled into the lower pole which did seem to pass possible small stone. A 6 x 26 and ureter stent was advanced into the lower ureter. The wire was removed with a good coil seen reconstituting in the midpole and a good coil in the bladder. The bladder was drained and scope removed. Some lidocaine jelly was infiltrated per urethra. She was awakened taken to recovery room in stable condition.   Complications: None Blood loss: Minimal Specimens: None  Drains: 6 x 26 cm left ureteral stent going into the lower pole infundibulum where stone was located  Disposition: Patient stable to PACU  Again the stone is small and difficult to see. I showed husband picture of retrograding gave them a copy. Stone possibly in the mid of the lower pole infundibulum / ureter.

## 2014-08-04 NOTE — Discharge Instructions (Signed)
Post Anesthesia Home Care Instructions  Activity: Get plenty of rest for the remainder of the day. A responsible adult should stay with you for 24 hours following the procedure.  For the next 24 hours, DO NOT: -Drive a car -Paediatric nurse -Drink alcoholic beverages -Take any medication unless instructed by your physician -Make any legal decisions or sign important papers.  Meals: Start with liquid foods such as gelatin or soup. Progress to regular foods as tolerated. Avoid greasy, spicy, heavy foods. If nausea and/or vomiting occur, drink only clear liquids until the nausea and/or vomiting subsides. Call your physician if vomiting continues.  Special Instructions/Symptoms: Your throat may feel dry or sore from the anesthesia or the breathing tube placed in your throat during surgery. If this causes discomfort, gargle with warm salt water. The discomfort should disappear within 24 hours. CYSTOSCOPY HOME CARE INSTRUCTIONS  Activity: Rest for the remainder of the day.  Do not drive or operate equipment today.  You may resume normal activities in one to two days as instructed by your physician.   Meals: Drink plenty of liquids and eat light foods such as gelatin or soup this evening.  You may return to a normal meal plan tomorrow.  Return to Work: You may return to work in one to two days or as instructed by your physician.  Special Instructions / Symptoms: Call your physician if any of these symptoms occur:   -persistent or heavy bleeding  -bleeding which continues after first few urination  -large blood clots that are difficult to pass  -urine stream diminishes or stops completely  -fever equal to or higher than 101 degrees Farenheit.  -cloudy urine with a strong, foul odor  -severe pain  Females should always wipe from front to back after elimination.  You may feel some burning pain when you urinate.  This should disappear with time.  Applying moist heat to the lower abdomen  or a hot tub bath may help relieve the pain. \  Follow-Up / Date of Return Visit to Your Physician: Call for an appointment to arrange follow-up.  Patient Signature:  ________________________________________________________  Nurse's Signature:  ________________________________________________________ Ureteral Stent Implantation, Care After Refer to this sheet in the next few weeks. These instructions provide you with information on caring for yourself after your procedure. Your health care provider may also give you more specific instructions. Your treatment has been planned according to current medical practices, but problems sometimes occur. Call your health care provider if you have any problems or questions after your procedure. WHAT TO EXPECT AFTER THE PROCEDURE You should be back to normal activity within 48 hours after the procedure. Nausea and vomiting may occur and are commonly the result of anesthesia. It is common to experience sharp pain in the back or lower abdomen and penis with voiding. This is caused by movement of the ends of the stent with the act of urinating.It usually goes away within minutes after you have stopped urinating. HOME CARE INSTRUCTIONS Make sure to drink plenty of fluids. You may have small amounts of bleeding, causing your urine to be red. This is normal. Certain movements may trigger pain or a feeling that you need to urinate. You may be given medicines to prevent infection or bladder spasms. Be sure to take all medicines as directed. Only take over-the-counter or prescription medicines for pain, discomfort, or fever as directed by your health care provider. Do not take aspirin, as this can make bleeding worse. Your stent will be left  in until the blockage is resolved. This may take 2 weeks or longer, depending on the reason for stent implantation. You may have an X-ray exam to make sure your ureter is open and that the stent has not moved out of position  (migrated). The stent can be removed by your health care provider in the office. Medicines may be given for comfort while the stent is being removed. Be sure to keep all follow-up appointments so your health care provider can check that you are healing properly. SEEK MEDICAL CARE IF:  You experience increasing pain.  Your pain medicine is not working. SEEK IMMEDIATE MEDICAL CARE IF:  Your urine is dark red or has blood clots.  You are leaking urine (incontinent).  You have a fever, chills, feeling sick to your stomach (nausea), or vomiting.  Your pain is not relieved by pain medicine.  The end of the stent comes out of the urethra.  You are unable to urinate.

## 2014-08-04 NOTE — Transfer of Care (Signed)
Immediate Anesthesia Transfer of Care Note  Patient: Julie [name]  Procedure(s) Performed: Procedure(s): CYSTOSCOPY WITH LEFT  RETROGRADE PYELOGRAM/ LEFT URETERAL STENT PLACEMENT (Left)  Patient Location: PACU  Anesthesia Type:General  Level of Consciousness: awake, alert , oriented and patient cooperative  Airway & Oxygen Therapy: Patient Spontanous Breathing and Patient connected to face mask oxygen  Post-op Assessment: Report given to RN, Post -op Vital signs reviewed and stable and Patient moving all extremities  Post vital signs: Reviewed and stable  Last Vitals:  Filed Vitals:   08/04/14 1649  BP:   Pulse:   Temp: 37.4 C  Resp:     Complications: No apparent anesthesia complications

## 2014-08-04 NOTE — Anesthesia Procedure Notes (Signed)
Procedure Name: Intubation Date/Time: 08/04/2014 7:18 PM Performed by: Carleene Cooper A Pre-anesthesia Checklist: Patient identified, Emergency Drugs available, Suction available, Patient being monitored and Timeout performed Patient Re-evaluated:Patient Re-evaluated prior to inductionOxygen Delivery Method: Circle system utilized Preoxygenation: Pre-oxygenation with 100% oxygen Intubation Type: Rapid sequence, Cricoid Pressure applied and IV induction Laryngoscope Size: Mac and 4 Grade View: Grade I Tube type: Oral Tube size: 7.5 mm Number of attempts: 1 Airway Equipment and Method: Stylet Placement Confirmation: ETT inserted through vocal cords under direct vision,  positive ETCO2 and breath sounds checked- equal and bilateral Secured at: 21 cm Tube secured with: Tape

## 2014-08-04 NOTE — Anesthesia Preprocedure Evaluation (Addendum)
Anesthesia Evaluation  Patient identified by MRN, date of birth, ID band Patient awake    Reviewed: Allergy & Precautions, NPO status   History of Anesthesia Complications Negative for: history of anesthetic complications  Airway Mallampati: II  TM Distance: >3 FB Neck ROM: Full    Dental  (+) Teeth Intact, Dental Advisory Given   Pulmonary former smoker,    Pulmonary exam normal       Cardiovascular negative cardio ROS      Neuro/Psych  Headaches, PSYCHIATRIC DISORDERS Anxiety Depression    GI/Hepatic Neg liver ROS, GERD-  ,  Endo/Other  negative endocrine ROS  Renal/GU      Musculoskeletal   Abdominal   Peds  Hematology   Anesthesia Other Findings   Reproductive/Obstetrics                           Anesthesia Physical Anesthesia Plan  ASA: II  Anesthesia Plan: General   Post-op Pain Management:    Induction: Intravenous, Rapid sequence and Cricoid pressure planned  Airway Management Planned: Oral ETT  Additional Equipment:   Intra-op Plan:   Post-operative Plan: Extubation in OR  Informed Consent: I have reviewed the patients History and Physical, chart, labs and discussed the procedure including the risks, benefits and alternatives for the proposed anesthesia with the patient or authorized representative who has indicated his/her understanding and acceptance.   Dental advisory given  Plan Discussed with: CRNA and Surgeon  Anesthesia Plan Comments:        Anesthesia Quick Evaluation

## 2014-08-04 NOTE — Progress Notes (Signed)
Patient is afebrile with stable vitals in the PACU  She is sitting up on the side of the bed and looks well.   She is drinking fluids without difficulty.  Plan discharge. She has appt with Dr. Janice Norrie on Monday.

## 2014-08-04 NOTE — Interval H&P Note (Signed)
History and Physical Interval Note:  08/04/2014 7:08 PM  Ozelle Brubacher  has presented today for surgery, with the diagnosis of left ureteral calculus. Pt seen and examined. I reviewed the notes and discussed with NP Warden. I reviewed the KUB and I do think I can see the stone left proximal. Pt still with some left flank pain and mow grade temp. She has not seen a stone pass. The various methods of treatment have been discussed with the patient and family. After consideration of risks, benefits and other options for treatment, the patient has consented to  Procedure(s): CYSTOSCOPY WITH RETROGRADE PYELOGRAM/URETERAL STENT PLACEMENT (Left) as a surgical intervention . Discussed rationale for stent only/staged procedure. Discussed possible normal RGP.  The patient's history has been reviewed, patient examined, no change in status, stable for surgery.  I have reviewed the patient's chart and labs.  Questions were answered to the patient's satisfaction.     Signora Zucco

## 2014-08-04 NOTE — H&P (Signed)
Reason For Visit Seen today as an urgent work-in for flank pain and fever.   Active Problems Problems  1. Calculus of left ureter (N20.1) 2. Fever (R50.9)  History of Present Illness 39 YO female patient of Dr. Sammie Bench seen today as an urgent work-in for flank pain and fever.     GU Hx:  Initially seen 07/26/14 as a referred by Dr Sharilyn Sites. About a month ago she experienced severe left flank pain associated with nausea, frequency. She had two severe episodes of pain. She was seen at [facility] where a CT scan showed a 4 mm proximal left ureteral calculus in the lower moiety of a partial duplication. She has been having pain on and off. She took only ibuprofen for the pain. She has not passed a stone but she has not been straining her urine. Ultrasound left kidney shows no hydronephrosis. On KUB there is a calcification in the left bony pelvis that could be a ureteral stone but it appears to be lateral to the usual location of the ureter.     Aug 04, 2014 Interval Hx:   States she developed temp 102.4 last pm. This am temp 101 but is afebrile in our office (took Tylenol 2 hrs ago). Has worsening left flank pain, nausea, and chills.   Past Medical History Problems  1. History of Anxiety (F41.9) 2. History of depression (Z86.59) 3. History of heartburn (J69.678)  Surgical History Problems  1. History of Appendectomy 2. History of Cesarean Section 3. History of Cholecystectomy 4. History of Tonsillectomy  Current Meds 1. Ibuprofen 800 MG Oral Tablet;  Therapy: (Recorded:17Feb2016) to Recorded 2. Ranitidine HCl - 150 MG Oral Tablet;  Therapy: (Recorded:17Feb2016) to Recorded 3. Tamsulosin HCl - 0.4 MG Oral Capsule; TAKE 1 CAPSULE Bedtime;  Therapy: [id number] to (Last Rx:17Feb2016)  Requested for: [id number] Ordered 4. Xanax 1 MG Oral Tablet;  Therapy: (Recorded:17Feb2016) to Recorded 5. Zoloft 100 MG Oral Tablet;  Therapy: (Recorded:17Feb2016) to Recorded 6.  Zonisamide 100 MG Oral Capsule;  Therapy: (Recorded:17Feb2016) to Recorded  Allergies Medication  1. Biaxin TABS  Family History Problems  1. Family history of asthma (Z82.5) : Father 2. Family history of diabetes mellitus (Z83.3) : Paternal Grandmother, Maternal Grandfather 3. Family history of hypertension (Z82.49) : Mother, Father, Maternal Grandfather 4. Family history of kidney stones (Z84.1) : Mother, Father 5. Family history of malignant neoplasm of brain (Z80.8) : Paternal Grandfather 37. Family history of Oral cancer : Maternal Grandmother  Social History Problems  1. Denied: History of Alcohol use 2. Caffeine use (F15.90) 3. Father alive and healthy   79 4. Former smoker [phone]) 5. Married 6. Mother alive and healthy   30 7. Number of children   1 son 8. Occupation   housewife  Review of Systems Genitourinary, constitutional, skin, eye, otolaryngeal, hematologic/lymphatic, cardiovascular, pulmonary, endocrine, musculoskeletal, gastrointestinal, neurological and psychiatric system(s) were reviewed and pertinent findings if present are noted and are otherwise negative.  Gastrointestinal: nausea and flank pain.  Constitutional: fever and chills.    Vitals Vital Signs [Data Includes: Last 1 Day]  Recorded: 26Feb2016 11:44AM  Blood Pressure: 127 / 82, Sitting Temperature: 98.1 F, Oral Heart Rate: 99 Respiration: 18  Physical Exam Constitutional: Well nourished and well developed . No acute distress. The patient appears well hydrated.  ENT:. The ears and nose are normal in appearance.  Neck: The appearance of the neck is normal.  Pulmonary: No respiratory distress.  Cardiovascular: Heart rate and rhythm are normal.  Abdomen: The abdomen is obese. No tenderness in the RLQ and no LLQ tenderness. moderate left CVA tenderness.  Skin: Normal skin turgor.  Neuro/Psych:. Mood and affect are appropriate.    Results/Data Urine [Data Includes: Last 1 Day]    [id number]  COLOR YELLOW   APPEARANCE CLEAR   SPECIFIC GRAVITY 1.010   pH 6.0   GLUCOSE NEG mg/dL  BILIRUBIN NEG   KETONE NEG mg/dL  BLOOD SMALL   PROTEIN NEG mg/dL  UROBILINOGEN 0.2 mg/dL  NITRITE NEG   LEUKOCYTE ESTERASE TRACE   SQUAMOUS EPITHELIAL/HPF FEW   WBC 3-6 WBC/hpf  RBC 0-2 RBC/hpf  BACTERIA FEW   CRYSTALS NONE SEEN   CASTS NONE SEEN    The following images/tracing/specimen were independently visualized:  KUB: shows a small 3-4 mm left ureteral calculus at UPJ.  The following clinical lab reports were reviewed:  UA- appears unchanged with few WBC and bacteria. Will culture.    Assessment Assessed  1. Calculus of left ureter (N20.1) 2. Fever (R50.9)  Plan Calculus of left ureter  1. Administer: Ketorolac Tromethamine 30 MG/ML Injection Solution; INJECT 2 ML  Intramuscular; To Be Done: [id number] 2. KUB; Status:Resulted - Requires Verification;   Done: [id number] 12:00AM Fever  3. Administer: CefTRIAXone Sodium 1 GM Injection Solution Reconstituted; INJECT 1 GM  Intramuscular; To Be Done: 78HYI5027 Health Maintenance  4. UA With REFLEX; [Do Not Release]; Status:Complete;   Done: 74JOI7867 11:31AM  Culture urine.   Ketorolac 60 mg IM today  Ceftriaxone 1 GM IM today   Will proceed with cysto/stent placement today urgently with fever by Dr. Junious Silk. Risk and benefits discussed of general anesthesia, infection, hematuria, or possible bladder or ureteral injury. All questions addressed and answered. Pt wishes to proceed.   Signatures Electronically signed by : Jimmey Ralph, Trey Paula; Aug 04 2014 12:36PM EST

## 2014-08-04 NOTE — Anesthesia Postprocedure Evaluation (Signed)
Anesthesia Post Note  Patient: Julie [name]  Procedure(s) Performed: Procedure(s) (LRB): CYSTOSCOPY WITH LEFT  RETROGRADE PYELOGRAM/ LEFT URETERAL STENT PLACEMENT (Left)  Anesthesia type: general  Patient location: PACU  Post pain: Pain level controlled  Post assessment: Patient's Cardiovascular Status Stable  Last Vitals:  Filed Vitals:   08/04/14 2100  BP:   Pulse:   Temp: 36.3 C  Resp: 18    Post vital signs: Reviewed and stable  Level of consciousness: sedated  Complications: No apparent anesthesia complications

## 2014-08-07 ENCOUNTER — Encounter (HOSPITAL_COMMUNITY): Payer: Self-pay | Admitting: Urology

## 2014-08-16 ENCOUNTER — Other Ambulatory Visit: Payer: Self-pay | Admitting: Adult Health

## 2015-03-14 ENCOUNTER — Other Ambulatory Visit: Payer: Self-pay | Admitting: Specialist

## 2015-03-14 DIAGNOSIS — D496 Neoplasm of unspecified behavior of brain: Secondary | ICD-10-CM

## 2015-03-19 ENCOUNTER — Other Ambulatory Visit: Payer: 59

## 2015-03-28 ENCOUNTER — Ambulatory Visit
Admission: RE | Admit: 2015-03-28 | Discharge: 2015-03-28 | Disposition: A | Discharge status: Home / Self Care | Payer: 59 | Source: Ambulatory Visit | Attending: Specialist | Admitting: Specialist

## 2015-03-28 ENCOUNTER — Other Ambulatory Visit: Payer: 59

## 2015-03-28 DIAGNOSIS — D496 Neoplasm of unspecified behavior of brain: Secondary | ICD-10-CM

## 2015-06-01 ENCOUNTER — Emergency Department (HOSPITAL_COMMUNITY)
Admission: EM | Admit: 2015-06-01 | Discharge: 2015-06-01 | Disposition: A | Discharge status: Home / Self Care | Payer: 59 | Attending: Emergency Medicine | Admitting: Emergency Medicine

## 2015-06-01 ENCOUNTER — Encounter (HOSPITAL_COMMUNITY): Payer: Self-pay | Admitting: Emergency Medicine

## 2015-06-01 DIAGNOSIS — Z87891 Personal history of nicotine dependence: Secondary | ICD-10-CM | POA: Insufficient documentation

## 2015-06-01 DIAGNOSIS — Z792 Long term (current) use of antibiotics: Secondary | ICD-10-CM | POA: Diagnosis not present

## 2015-06-01 DIAGNOSIS — J40 Bronchitis, not specified as acute or chronic: Secondary | ICD-10-CM

## 2015-06-01 DIAGNOSIS — Z862 Personal history of diseases of the blood and blood-forming organs and certain disorders involving the immune mechanism: Secondary | ICD-10-CM | POA: Insufficient documentation

## 2015-06-01 DIAGNOSIS — Z8742 Personal history of other diseases of the female genital tract: Secondary | ICD-10-CM | POA: Insufficient documentation

## 2015-06-01 DIAGNOSIS — Z87440 Personal history of urinary (tract) infections: Secondary | ICD-10-CM | POA: Insufficient documentation

## 2015-06-01 DIAGNOSIS — K219 Gastro-esophageal reflux disease without esophagitis: Secondary | ICD-10-CM | POA: Diagnosis not present

## 2015-06-01 DIAGNOSIS — J069 Acute upper respiratory infection, unspecified: Secondary | ICD-10-CM | POA: Diagnosis not present

## 2015-06-01 DIAGNOSIS — F419 Anxiety disorder, unspecified: Secondary | ICD-10-CM | POA: Insufficient documentation

## 2015-06-01 DIAGNOSIS — F329 Major depressive disorder, single episode, unspecified: Secondary | ICD-10-CM | POA: Diagnosis not present

## 2015-06-01 DIAGNOSIS — R05 Cough: Secondary | ICD-10-CM | POA: Diagnosis present

## 2015-06-01 DIAGNOSIS — Z872 Personal history of diseases of the skin and subcutaneous tissue: Secondary | ICD-10-CM | POA: Diagnosis not present

## 2015-06-01 DIAGNOSIS — J209 Acute bronchitis, unspecified: Secondary | ICD-10-CM | POA: Diagnosis not present

## 2015-06-01 DIAGNOSIS — Z79899 Other long term (current) drug therapy: Secondary | ICD-10-CM | POA: Diagnosis not present

## 2015-06-01 MED ORDER — DEXAMETHASONE 4 MG PO TABS: 4.0000 mg | ORAL_TABLET | Freq: Two times a day (BID) | ORAL | Status: DC

## 2015-06-01 MED ORDER — IBUPROFEN 800 MG PO TABS
800.0000 mg | ORAL_TABLET | Freq: Once | ORAL | Status: AC
  Administered 2015-06-01: 800 mg via ORAL
  Filled 2015-06-01: qty 1

## 2015-06-01 MED ORDER — LORATADINE-PSEUDOEPHEDRINE ER 5-120 MG PO TB12: 1.0000 | ORAL_TABLET | Freq: Two times a day (BID) | ORAL | Status: DC

## 2015-06-01 MED ORDER — AMOXICILLIN 250 MG PO CAPS
500.0000 mg | ORAL_CAPSULE | Freq: Once | ORAL | Status: AC
  Administered 2015-06-01: 500 mg via ORAL
  Filled 2015-06-01: qty 2

## 2015-06-01 MED ORDER — PREDNISONE 50 MG PO TABS
60.0000 mg | ORAL_TABLET | Freq: Once | ORAL | Status: AC
  Administered 2015-06-01: 60 mg via ORAL
  Filled 2015-06-01: qty 1

## 2015-06-01 MED ORDER — ONDANSETRON HCL 4 MG PO TABS
4.0000 mg | ORAL_TABLET | Freq: Once | ORAL | Status: AC
  Administered 2015-06-01: 4 mg via ORAL
  Filled 2015-06-01: qty 1

## 2015-06-01 MED ORDER — AMOXICILLIN 500 MG PO CAPS: 500.0000 mg | ORAL_CAPSULE | Freq: Three times a day (TID) | ORAL | Status: DC

## 2015-06-01 NOTE — ED Provider Notes (Signed)
CSN: [id number]     Arrival date & time 06/01/15  [id number] History   First MD Initiated Contact with Patient 06/01/15 0820     Chief Complaint  Patient presents with  . Cough     (Consider location/radiation/quality/duration/timing/severity/associated sxs/prior Treatment) Patient is a 39 y.o. female presenting with cough. The history is provided by the patient.  Cough Cough characteristics:  Productive Sputum characteristics:  Green Severity:  Moderate Onset quality:  Gradual Duration:  2 days Timing:  Intermittent Progression:  Worsening Chronicity:  New Smoker: no   Context: sick contacts and weather changes   Relieved by:  Nothing Associated symptoms: chills, rhinorrhea, shortness of breath, sinus congestion and wheezing   Associated symptoms: no rash     Past Medical History  Diagnosis Date  . Ectopic pregnancy   . Urinary tract infection   . Placenta previa 2012    Current pregnancy   . Depression   . Anxiety   . GERD (gastroesophageal reflux disease)     no meds  . Eczema   . Anemia   . Breast mass, right 10/20/2012  . Hernia of abdominal wall 01/04/2013  . Breast discharge 07/01/2013    Pt noticed discharge, none on exam today will check TSH and Prolactin  . Vaginal discharge 07/01/2013  . Headache   . Left breast mass 04/12/2014    Has pea sized nodule at 6 o'clock tender and mobile, will get mammogram and [place] if needed   Past Surgical History  Procedure Laterality Date  . Tonsillectomy    . Appendectomy    . Cholecystectomy    . Appendectomy    . Cesarean section  04/24/2011    Procedure: CESAREAN SECTION;  Surgeon: Florian Buff, MD;  Location: [facility];  Service: Gynecology;  Laterality: N/A;  Primary/Previa  . Breast biopsy Right   . Cystoscopy w/ ureteral stent placement Left 08/04/2014    Procedure: CYSTOSCOPY WITH LEFT  RETROGRADE PYELOGRAM/ LEFT URETERAL STENT PLACEMENT;  Surgeon: Festus Aloe, MD;  Location: [facility];  Service: Urology;  Laterality:  Left;   Family History  Problem Relation Age of Onset  . Depression Mother   . Cancer Mother   . Seizures Mother   . Bipolar disorder Mother   . Hypertension Father   . Heart disease Maternal Grandmother   . Cancer Maternal Grandmother     oral  . Hypertension Maternal Grandmother   . Other Maternal Grandmother     memory problems  . Stroke Paternal Grandfather   . Hypertension Paternal Grandfather   . Other Paternal Grandfather     brain tumors  . Heart disease Maternal Grandfather   . Pancreatitis Maternal Grandfather   . Cancer Paternal Grandmother     breast  . Heart disease Paternal Grandmother   . Kidney disease Paternal Grandmother     kidney failure  . Diabetes Paternal Grandmother    Social History  Substance Use Topics  . Smoking status: Former Smoker    Types: Cigarettes    Quit date: 09/08/2010  . Smokeless tobacco: Never Used  . Alcohol Use: Yes     Comment: occ mixed drink   OB History    Gravida Para Term Preterm AB TAB SAB Ectopic Multiple Living   2 1 0 1 1 0 0 1 0 1      Review of Systems  Constitutional: Positive for chills.  HENT: Positive for congestion and rhinorrhea.   Respiratory: Positive for cough, shortness of breath and wheezing.  Skin: Negative for rash.  All other systems reviewed and are negative.     Allergies  Clarithromycin  Home Medications   Prior to Admission medications   Medication Sig Start Date End Date Taking? Authorizing Provider  ALPRAZolam Duanne Moron) 1 MG tablet Take 0.5-1 mg by mouth 3 (three) times daily as needed for anxiety.    Historical Provider, MD  ciprofloxacin (CIPRO) 500 MG tablet Take 1 tablet (500 mg total) by mouth 2 (two) times daily. 08/04/14   Festus Aloe, MD  fluconazole (DIFLUCAN) 150 MG tablet TAKE 1 TABLET BY MOUTH ONCE. 08/16/14   Estill Dooms, NP  ibuprofen (ADVIL,MOTRIN) 800 MG tablet Take 1 tablet (800 mg total) by mouth every 8 (eight) hours as needed for mild pain. 06/21/14    Kristen N Ward, DO  oxyCODONE-acetaminophen (PERCOCET/ROXICET) 5-325 MG per tablet Take 1-2 tablets by mouth every 6 (six) hours as needed. 06/21/14   Kristen N Ward, DO  oxyCODONE-acetaminophen (ROXICET) 5-325 MG per tablet Take 1-2 tablets by mouth every 6 (six) hours as needed for severe pain. 08/04/14   Festus Aloe, MD  ranitidine (ZANTAC) 150 MG tablet Take 1 tablet (150 mg total) by mouth 2 (two) times daily. 11/15/13   Carmin Muskrat, MD  sertraline (ZOLOFT) 100 MG tablet Take 100 mg by mouth every morning.    Historical Provider, MD  tamsulosin (FLOMAX) 0.4 MG CAPS capsule Take 1 capsule (0.4 mg total) by mouth daily. 06/21/14   Kristen N Ward, DO  zonisamide (ZONEGRAN) 25 MG capsule Take 25 mg by mouth at bedtime.    Historical Provider, MD   BP 105/62 mmHg  Pulse 63  Temp(Src) 98.2 F (36.8 C)  Resp 18  Ht 5\' 7"  (1.702 m)  Wt 129.275 kg  BMI 44.63 kg/m2  SpO2 96%  LMP 06/01/2015 Physical Exam  Constitutional: She is oriented to person, place, and time. She appears well-developed and well-nourished.  Non-toxic appearance.  HENT:  Head: Normocephalic.  Right Ear: Tympanic membrane and external ear normal.  Left Ear: Tympanic membrane and external ear normal.  Nasal congestion  Eyes: EOM and lids are normal. Pupils are equal, round, and reactive to light.  Neck: Normal range of motion. Neck supple. Carotid bruit is not present. No tracheal deviation present.  Cardiovascular: Normal rate, regular rhythm, normal heart sounds, intact distal pulses and normal pulses.  Exam reveals no friction rub.   No murmur heard. Pulmonary/Chest: Breath sounds normal. No respiratory distress.  Course breath sounds present. Symmetrical rise and fall of the chest. Pt speaks in complete sentences.  Abdominal: Soft. Bowel sounds are normal. There is no tenderness. There is no guarding.  Musculoskeletal: Normal range of motion.  Lymphadenopathy:       Head (right side): No submandibular adenopathy  present.       Head (left side): No submandibular adenopathy present.    She has no cervical adenopathy.  Neurological: She is alert and oriented to person, place, and time. She has normal strength. No cranial nerve deficit or sensory deficit.  Skin: Skin is warm and dry.  Psychiatric: She has a normal mood and affect. Her speech is normal.  Nursing note and vitals reviewed.   ED Course  Procedures (including critical care time) Labs Review Labs Reviewed - No data to display  Imaging Review No results found. I have personally reviewed and evaluated these images and lab results as part of my medical decision-making.   EKG Interpretation None      MDM  Vital signs are well within normal limits. Pulse oximetry is 96% on room air. Within normal limits by my interpretation. The examination suggest bronchitis and upper respiratory infection. Patient will be treated with Amoxil, Decadron, and Claritin-D. Patient is to use ibuprofen every 6 hours or aching or soreness.    Final diagnoses:  None    *I have reviewed nursing notes, vital signs, and all appropriate lab and imaging results for this patient.    Lily Kocher, PA-C 06/01/15 [id number]  Merrily Pew, MD 06/01/15 [phone]

## 2015-06-01 NOTE — Discharge Instructions (Signed)
Please increase fluids. Use Tylenol every 4 hours, or ibuprofen every 6 hours for fever or aching. Please wash hands frequently. Usual mask until symptoms have resolved. Please use Decadron, and Claritin-D 2 times daily. Use Amoxil 3 times daily. Please see your primary physician, or return to the emergency department if not improving.

## 2015-06-01 NOTE — ED Notes (Signed)
Pt c/o productive cough-green sputum, congestion and sore throat x 2 days.

## 2015-09-11 DIAGNOSIS — M9902 Segmental and somatic dysfunction of thoracic region: Secondary | ICD-10-CM | POA: Diagnosis not present

## 2015-09-11 DIAGNOSIS — M546 Pain in thoracic spine: Secondary | ICD-10-CM | POA: Diagnosis not present

## 2015-09-11 DIAGNOSIS — M9901 Segmental and somatic dysfunction of cervical region: Secondary | ICD-10-CM | POA: Diagnosis not present

## 2015-09-11 DIAGNOSIS — G44219 Episodic tension-type headache, not intractable: Secondary | ICD-10-CM | POA: Diagnosis not present

## 2015-09-19 ENCOUNTER — Other Ambulatory Visit (HOSPITAL_COMMUNITY)
Admission: RE | Admit: 2015-09-19 | Discharge: 2015-09-19 | Disposition: A | Discharge status: Home / Self Care | Payer: BLUE CROSS/BLUE SHIELD | Source: Ambulatory Visit | Attending: Adult Health | Admitting: Adult Health

## 2015-09-19 ENCOUNTER — Ambulatory Visit (INDEPENDENT_AMBULATORY_CARE_PROVIDER_SITE_OTHER): Payer: BLUE CROSS/BLUE SHIELD | Admitting: Adult Health

## 2015-09-19 ENCOUNTER — Encounter: Payer: Self-pay | Admitting: Adult Health

## 2015-09-19 VITALS — BP 120/80 | HR 70 | Ht 67.0 in | Wt 277.0 lb

## 2015-09-19 DIAGNOSIS — R102 Pelvic and perineal pain: Secondary | ICD-10-CM

## 2015-09-19 DIAGNOSIS — Z01419 Encounter for gynecological examination (general) (routine) without abnormal findings: Secondary | ICD-10-CM | POA: Diagnosis not present

## 2015-09-19 DIAGNOSIS — Z1151 Encounter for screening for human papillomavirus (HPV): Secondary | ICD-10-CM | POA: Diagnosis not present

## 2015-09-19 DIAGNOSIS — N946 Dysmenorrhea, unspecified: Secondary | ICD-10-CM

## 2015-09-19 DIAGNOSIS — N941 Unspecified dyspareunia: Secondary | ICD-10-CM

## 2015-09-19 DIAGNOSIS — N898 Other specified noninflammatory disorders of vagina: Secondary | ICD-10-CM

## 2015-09-19 DIAGNOSIS — N920 Excessive and frequent menstruation with regular cycle: Secondary | ICD-10-CM

## 2015-09-19 HISTORY — DX: Excessive and frequent menstruation with regular cycle: N92.0

## 2015-09-19 HISTORY — DX: Dysmenorrhea, unspecified: N94.6

## 2015-09-19 HISTORY — DX: Unspecified dyspareunia: N94.10

## 2015-09-19 HISTORY — DX: Pelvic and perineal pain: R10.2

## 2015-09-19 NOTE — Patient Instructions (Signed)
Return in 1 week for [place] Physical in 1 year Mammogram at 47

## 2015-09-19 NOTE — Progress Notes (Signed)
Patient ID: Julie [name], female   DOB: 1976-05-19, 40 y.o.   MRN: [id number] History of Present Illness: Julie [name] is a 40 year old white female in for a well woman gyn exam and pap and is complaining of vaginal discharge with odor at times and heavy periods, last 7 days, then has brown, has clots an sex hurts at times and breast have been sore lately.Also has pelvic pain in C section scar.She is not using birth control is [place] if gets pregnant, she says. PCP is Dr Hilma Favors.   Current Medications, Allergies, Past Medical History, Past Surgical History, Family History and Social History were reviewed in [employer] record.     Review of Systems: Patient denies any headaches, hearing loss, fatigue, blurred vision, shortness of breath, chest pain, problems with bowel movements, urination. No joint pain or mood swings.    Physical Exam:BP 120/80 mmHg  Pulse 70  Ht 5\' 7"  (1.702 m)  Wt 277 lb (125.646 kg)  BMI 43.37 kg/m2  LMP 08/29/2015 General:  Well developed, well nourished, no acute distress Skin:  Warm and dry Neck:  Midline trachea, normal thyroid, good ROM, no lymphadenopathy Lungs; Clear to auscultation bilaterally Breast:  No dominant palpable mass, retraction, or nipple discharge,has tenderness UOQ bilaterally Cardiovascular: Regular rate and rhythm Abdomen:  Soft, non tender, no hepatosplenomegaly,obese Pelvic:  External genitalia is normal in appearance, no lesions.  The vagina is normal in appearance. Urethra has no lesions or masses. The cervix is bulbous, pap with HPV performed.  Uterus is felt to be normal size, shape, and contour.  No adnexal masses, bilateral tenderness noted.Bladder is non tender, no masses felt. Extremities/musculoskeletal:  No swelling or varicosities noted, no clubbing or cyanosis Psych:  No mood changes, alert and cooperative,seems happy   Impression: Well woman gyn exam and pap Pelvic pain Menorrhagia Vaginal  discharge Dyspareunia Dysmenorrhea     Plan: Return in 1 week for gyn [place] Physical in 1 year, pap in 3 if normal Mammogram at 42 Will talk after [place] results in

## 2015-09-21 LAB — CYTOLOGY - PAP

## 2015-09-26 ENCOUNTER — Other Ambulatory Visit: Payer: BLUE CROSS/BLUE SHIELD

## 2015-09-26 ENCOUNTER — Ambulatory Visit (INDEPENDENT_AMBULATORY_CARE_PROVIDER_SITE_OTHER): Payer: BLUE CROSS/BLUE SHIELD

## 2015-09-26 DIAGNOSIS — N920 Excessive and frequent menstruation with regular cycle: Secondary | ICD-10-CM

## 2015-09-26 DIAGNOSIS — R102 Pelvic and perineal pain: Secondary | ICD-10-CM

## 2015-09-26 DIAGNOSIS — N854 Malposition of uterus: Secondary | ICD-10-CM

## 2015-09-26 NOTE — Progress Notes (Signed)
PELVIC US TA/TV: homogenous anteverted uterus, thickened EEC 17.8 mm (pt menstrual cycle started today),normal ov's bilat (mobile),no free fluid or pain during ultrasound.

## 2015-09-27 ENCOUNTER — Encounter: Payer: Self-pay | Admitting: Adult Health

## 2015-09-27 ENCOUNTER — Ambulatory Visit (INDEPENDENT_AMBULATORY_CARE_PROVIDER_SITE_OTHER): Payer: BLUE CROSS/BLUE SHIELD | Admitting: Adult Health

## 2015-09-27 VITALS — BP 100/70 | HR 70 | Ht 67.0 in | Wt 281.0 lb

## 2015-09-27 DIAGNOSIS — R102 Pelvic and perineal pain: Secondary | ICD-10-CM

## 2015-09-27 DIAGNOSIS — N941 Unspecified dyspareunia: Secondary | ICD-10-CM | POA: Diagnosis not present

## 2015-09-27 DIAGNOSIS — N946 Dysmenorrhea, unspecified: Secondary | ICD-10-CM

## 2015-09-27 DIAGNOSIS — N920 Excessive and frequent menstruation with regular cycle: Secondary | ICD-10-CM | POA: Diagnosis not present

## 2015-09-27 MED ORDER — HYDROCODONE-ACETAMINOPHEN 5-325 MG PO TABS: 1.0000 | ORAL_TABLET | Freq: Four times a day (QID) | ORAL | Status: DC | PRN

## 2015-09-27 NOTE — Progress Notes (Signed)
Subjective:     Patient ID: Julie [name], female   DOB: December 16, 1975, 40 y.o.   MRN: [id number]  HPI [name] is a 40 year old white female, married, back in follow up of heavy 7 day periods, clots, and pain and pain with sex and pelvic pain, seems to be at C section scar.She had [place] 4/19 and period started that day too.She is teary today, her grandmother has cancer, getting PET scan.  Review of Systems Patient denies any headaches, hearing loss, fatigue, blurred vision, shortness of breath, chest pain, problems with bowel movements, urination, or intercourse. No joint pain or mood swings.See HPI for positives.   Reviewed past medical,surgical, social and family history. Reviewed medications and allergies.  Objective:   Physical Exam BP 100/70 mmHg  Pulse 70  Ht 5\' 7"  (1.702 m)  Wt 281 lb (127.461 kg)  BMI 44.00 kg/m2  LMP 09/26/2015 (Exact Date) Reviewed [place]:   Uterus 8.8 x 5.5 x 4.2 cm, homogenous anteverted uterus  Endometrium 17.8 mm, symmetrical, thickened endometrium (secretory phase)  Right ovary 2.4 x 2.5 x 1.6 cm, wnl  Left ovary 2.8 x 2.6 x 1.8 cm, wnl  No free fluid,no pain during ultrasound   Technician Comments:  PELVIC US TA/TV: homogenous anteverted uterus, thickened EEC 17.8 mm (pt menstrual cycle started today),normal ov's bilat (mobile),no free fluid or pain during ultrasound. Pap was normal with negative HPV. Discussed options of watching if wants another child and she says that is out, husband does not want another one,so discussed OCs, IUD and ablation with tubal as option and she wants tubal and ablation, will give handouts to review and appt to see Dr Elonda Husky next week to discuss.She asks for something for pain, will give 30 norco, til appt with Dr Elonda Husky.  Face time 10 minutes counseling.  Assessment:     Menorrhagia Dysmenorrhea Pelvic pain  Dyspareunia     Plan:     Rx norco 5-325 mg #30 take 1 every  6 hours prn pain no refills Return in 1 week to discuss options with Dr Elonda Husky Review handouts on ablation and tubal

## 2015-09-27 NOTE — Patient Instructions (Signed)
Review handout on tubal and ablation  Return in 1 week to discuss options with Dr Elonda Husky

## 2015-10-04 ENCOUNTER — Ambulatory Visit (INDEPENDENT_AMBULATORY_CARE_PROVIDER_SITE_OTHER): Payer: BLUE CROSS/BLUE SHIELD | Admitting: Obstetrics & Gynecology

## 2015-10-04 ENCOUNTER — Encounter: Payer: Self-pay | Admitting: Obstetrics & Gynecology

## 2015-10-04 VITALS — BP 120/80 | Ht 67.0 in | Wt 279.0 lb

## 2015-10-04 DIAGNOSIS — N920 Excessive and frequent menstruation with regular cycle: Secondary | ICD-10-CM

## 2015-10-04 DIAGNOSIS — N941 Unspecified dyspareunia: Secondary | ICD-10-CM

## 2015-10-04 DIAGNOSIS — N946 Dysmenorrhea, unspecified: Secondary | ICD-10-CM

## 2015-10-04 NOTE — Progress Notes (Signed)
Patient ID: Julie [name], female   DOB: [birth year], 40 y.o.   MRN: [id number] Preoperative History and Physical  Julie [name] is a 40 y.o. [phone] with Patient's last menstrual period was 09/26/2015 (exact date). admitted for a abdominal hysterectomy including removal of the cervix and removal of both Fallopian tubes prophylactically.  Pt has heavy 7 days of bleeding with her menses with severe cramping.  Sonogram does not reveal any anatomical abnormalities. Additionally she has midline bump dyspareunia with intercourse 100% of encounters, has to stop every time and severely limits her sexual life.  The pain is recreated with the least movement of the cervix as with the vaginal probe sonogram.  She also has midline pelvic pain which she states feels like tearing or ripping in the area of her previous Caesarean section scar.  Due to her significant sexual impairment due to cervical bumping, an ablation and tubal which would solve her dysmenorrhea and sterilization needs, will not address the dysparunia  Discussed options at length and pt agrees with plan of abdominal hysterectomy including cervix with removal o f the Fallopian tubes  PMH:    Past Medical History  Diagnosis Date  . Ectopic pregnancy   . Urinary tract infection   . Placenta previa 2012    Current pregnancy   . Depression   . Anxiety   . GERD (gastroesophageal reflux disease)     no meds  . Eczema   . Anemia   . Breast mass, right 10/20/2012  . Hernia of abdominal wall 01/04/2013  . Breast discharge 07/01/2013    Pt noticed discharge, none on exam today will check TSH and Prolactin  . Vaginal discharge 07/01/2013  . Headache   . Left breast mass 04/12/2014    Has pea sized nodule at 6 o'clock tender and mobile, will get mammogram and [place] if needed  . Pelvic pain in female 09/19/2015  . Menorrhagia with regular cycle 09/19/2015  . Dyspareunia in female 09/19/2015  . Dysmenorrhea 09/19/2015  . Anemia     PSH:     Past  Surgical History  Procedure Laterality Date  . Tonsillectomy    . Appendectomy    . Cholecystectomy    . Appendectomy    . Cesarean section  04/24/2011    Procedure: CESAREAN SECTION;  Surgeon: Florian Buff, MD;  Location: [facility];  Service: Gynecology;  Laterality: N/A;  Primary/Previa  . Breast biopsy Right   . Cystoscopy w/ ureteral stent placement Left 08/04/2014    Procedure: CYSTOSCOPY WITH LEFT  RETROGRADE PYELOGRAM/ LEFT URETERAL STENT PLACEMENT;  Surgeon: Festus Aloe, MD;  Location: [facility];  Service: Urology;  Laterality: Left;    POb/GynH:      OB History    Gravida Para Term Preterm AB TAB SAB Ectopic Multiple Living   2 1 0 1 1 0 0 1 0 1       SH:   Social History  Substance Use Topics  . Smoking status: Former Smoker -- 14 years    Types: Cigarettes    Quit date: 09/08/2010  . Smokeless tobacco: Never Used  . Alcohol Use: Yes     Comment: occ mixed drink    FH:    Family History  Problem Relation Age of Onset  . Depression Mother   . Cancer Mother   . Seizures Mother   . Bipolar disorder Mother   . Hypertension Father   . Heart disease Maternal Grandmother   . Cancer Maternal Grandmother  oral  . Hypertension Maternal Grandmother   . Other Maternal Grandmother     memory problems  . Stroke Paternal Grandfather   . Hypertension Paternal Grandfather   . Other Paternal Grandfather     brain tumors  . Heart disease Maternal Grandfather   . Pancreatitis Maternal Grandfather   . Cancer Paternal Grandmother     breast  . Heart disease Paternal Grandmother   . Kidney disease Paternal Grandmother     kidney failure  . Diabetes Paternal Grandmother      Allergies:  Allergies  Allergen Reactions  . Clarithromycin Other (See Comments)    Makes throat raw    Medications:       Current outpatient prescriptions:  .  ALPRAZolam (XANAX) 1 MG tablet, Take 0.5-1 mg by mouth 3 (three) times daily as needed for anxiety., Disp: , Rfl:  .  eletriptan  (RELPAX) 40 MG tablet, Take 40 mg by mouth as needed for migraine or headache. May repeat in 2 hours if headache persists or recurs., Disp: , Rfl:  .  ferrous sulfate 325 (65 FE) MG tablet, Take 325 mg by mouth 2 (two) times daily with a meal., Disp: , Rfl:  .  omeprazole (PRILOSEC) 20 MG capsule, Take 20 mg by mouth daily. , Disp: , Rfl: 4 .  propranolol (INDERAL) 80 MG tablet, Take 80 mg by mouth 2 (two) times daily., Disp: , Rfl:  .  sertraline (ZOLOFT) 100 MG tablet, Take 100 mg by mouth every morning., Disp: , Rfl:  .  topiramate (TOPAMAX) 25 MG capsule, Take 25 mg by mouth at bedtime., Disp: , Rfl:  .  HYDROcodone-acetaminophen (NORCO/VICODIN) 5-325 MG tablet, Take 1 tablet by mouth every 6 (six) hours as needed. (Patient not taking: Reported on 10/04/2015), Disp: 30 tablet, Rfl: 0  Review of Systems:   Review of Systems  Constitutional: Negative for fever, chills, weight loss, malaise/fatigue and diaphoresis.  HENT: Negative for hearing loss, ear pain, nosebleeds, congestion, sore throat, neck pain, tinnitus and ear discharge.   Eyes: Negative for blurred vision, double vision, photophobia, pain, discharge and redness.  Respiratory: Negative for cough, hemoptysis, sputum production, shortness of breath, wheezing and stridor.   Cardiovascular: Negative for chest pain, palpitations, orthopnea, claudication, leg swelling and PND.  Gastrointestinal: Positive for abdominal pain. Negative for heartburn, nausea, vomiting, diarrhea, constipation, blood in stool and melena.  Genitourinary: Negative for dysuria, urgency, frequency, hematuria and flank pain.  Musculoskeletal: Negative for myalgias, back pain, joint pain and falls.  Skin: Negative for itching and rash.  Neurological: Negative for dizziness, tingling, tremors, sensory change, speech change, focal weakness, seizures, loss of consciousness, weakness and headaches.  Endo/Heme/Allergies: Negative for environmental allergies and polydipsia.  Does not bruise/bleed easily.  Psychiatric/Behavioral: Negative for depression, suicidal ideas, hallucinations, memory loss and substance abuse. The patient is not nervous/anxious and does not have insomnia.      PHYSICAL EXAM:  Blood pressure 120/80, height 5\' 7"  (1.702 m), weight 279 lb (126.554 kg), last menstrual period 09/26/2015.    Vitals reviewed. Constitutional: She is oriented to person, place, and time. She appears well-developed and well-nourished.  HENT:  Head: Normocephalic and atraumatic.  Right Ear: External ear normal.  Left Ear: External ear normal.  Nose: Nose normal.  Mouth/Throat: Oropharynx is clear and moist.  Eyes: Conjunctivae and EOM are normal. Pupils are equal, round, and reactive to light. Right eye exhibits no discharge. Left eye exhibits no discharge. No scleral icterus.  Neck: Normal range of  motion. Neck supple. No tracheal deviation present. No thyromegaly present.  Cardiovascular: Normal rate, regular rhythm, normal heart sounds and intact distal pulses.  Exam reveals no gallop and no friction rub.   No murmur heard. Respiratory: Effort normal and breath sounds normal. No respiratory distress. She has no wheezes. She has no rales. She exhibits no tenderness.  GI: Soft. Bowel sounds are normal. She exhibits no distension and no mass. There is tenderness. There is no rebound and no guarding.  Genitourinary:       Vulva is normal without lesions Vagina is pink moist without discharge Cervix normal in appearance and pap is normal, + pain with bumping Uterus is normal size shape tender to palpation and movement in midline, could be adherent to the anterior abdominal wall Adnexa is negative with normal sized ovaries by sonogram  Musculoskeletal: Normal range of motion. She exhibits no edema and no tenderness.  Neurological: She is alert and oriented to person, place, and time. She has normal reflexes. She displays normal reflexes. No cranial nerve deficit.  She exhibits normal muscle tone. Coordination normal.  Skin: Skin is warm and dry. No rash noted. No erythema. No pallor.  Psychiatric: She has a normal mood and affect. Her behavior is normal. Judgment and thought content normal.    Labs: No results found for this or any previous visit (from the past 336 hour(s)).  EKG: Orders placed or performed during the hospital encounter of 11/15/13  . ED EKG  . ED EKG  . ED EKG  . ED EKG  . EKG 12-Lead  . EKG 12-Lead  . EKG    Imaging Studies: US Transvaginal Non-ob  Oct 25, 2015  GYNECOLOGIC SONOGRAM Ira Dicenzo is a 40 y.o. [id number] LMP Oct 25, 2015 for a pelvic sonogram for pelvic pain and menorrhagia. Uterus                      8.8 x 5.5 x 4.2 cm, homogenous anteverted uterus Endometrium          17.8 mm, symmetrical, thickened endometrium (secretory phase) Right ovary             2.4 x 2.5 x 1.6 cm, wnl Left ovary                2.8 x 2.6 x 1.8 cm, wnl No free fluid,no pain during ultrasound Technician Comments: PELVIC US TA/TV: homogenous anteverted uterus, thickened EEC 17.8 mm (pt menstrual cycle started today),normal ov's bilat (mobile),no free fluid or pain during ultrasound. Silver Huguenin 2015/10/25 3:07 PM   US Pelvis Complete  10-25-2015  GYNECOLOGIC SONOGRAM Tyanna Weeda is a 40 y.o. [id number] LMP Oct 25, 2015 for a pelvic sonogram for pelvic pain and menorrhagia. Uterus                      8.8 x 5.5 x 4.2 cm, homogenous anteverted uterus Endometrium          17.8 mm, symmetrical, thickened endometrium (secretory phase) Right ovary             2.4 x 2.5 x 1.6 cm, wnl Left ovary                2.8 x 2.6 x 1.8 cm, wnl No free fluid,no pain during ultrasound Technician Comments: PELVIC US TA/TV: homogenous anteverted uterus, thickened EEC 17.8 mm (pt menstrual cycle started today),normal ov's bilat (mobile),no free fluid or pain during ultrasound. Amber Heide Guile 2015-10-25 3:07 PM  Assessment: Menorrhagia with regular  cycle  Dysmenorrhea  Dyspareunia in female    Patient Active Problem List   Diagnosis Date Noted  . Pelvic pain in female 09/19/2015  . Menorrhagia with regular cycle 09/19/2015  . Dyspareunia in female 09/19/2015  . Dysmenorrhea 09/19/2015  . Left breast mass 04/12/2014  . Breast discharge 07/01/2013  . Vaginal discharge 07/01/2013  . Hernia of abdominal wall 01/04/2013  . Breast mass, right 10/20/2012  . Ectopic pregnancy     Plan: Abdominal hysterectomy with removal of tubes for indications listed above  Preservation of her ovaries  Jasman Pfeifle H 10/04/2015 2:50 PM

## 2015-10-05 ENCOUNTER — Other Ambulatory Visit: Payer: Self-pay | Admitting: Obstetrics & Gynecology

## 2015-10-05 ENCOUNTER — Encounter (HOSPITAL_COMMUNITY)
Admission: RE | Admit: 2015-10-05 | Discharge: 2015-10-05 | Disposition: A | Discharge status: Home / Self Care | Payer: BLUE CROSS/BLUE SHIELD | Source: Ambulatory Visit | Attending: Obstetrics & Gynecology | Admitting: Obstetrics & Gynecology

## 2015-10-05 ENCOUNTER — Encounter (HOSPITAL_COMMUNITY): Payer: Self-pay

## 2015-10-05 DIAGNOSIS — M9901 Segmental and somatic dysfunction of cervical region: Secondary | ICD-10-CM | POA: Diagnosis not present

## 2015-10-05 DIAGNOSIS — Z01812 Encounter for preprocedural laboratory examination: Secondary | ICD-10-CM | POA: Insufficient documentation

## 2015-10-05 DIAGNOSIS — Z809 Family history of malignant neoplasm, unspecified: Secondary | ICD-10-CM | POA: Diagnosis not present

## 2015-10-05 DIAGNOSIS — N941 Unspecified dyspareunia: Secondary | ICD-10-CM | POA: Diagnosis not present

## 2015-10-05 DIAGNOSIS — M546 Pain in thoracic spine: Secondary | ICD-10-CM | POA: Diagnosis not present

## 2015-10-05 DIAGNOSIS — N809 Endometriosis, unspecified: Secondary | ICD-10-CM | POA: Diagnosis not present

## 2015-10-05 DIAGNOSIS — N920 Excessive and frequent menstruation with regular cycle: Secondary | ICD-10-CM | POA: Diagnosis not present

## 2015-10-05 DIAGNOSIS — K219 Gastro-esophageal reflux disease without esophagitis: Secondary | ICD-10-CM | POA: Diagnosis not present

## 2015-10-05 DIAGNOSIS — N946 Dysmenorrhea, unspecified: Secondary | ICD-10-CM | POA: Diagnosis not present

## 2015-10-05 DIAGNOSIS — Z823 Family history of stroke: Secondary | ICD-10-CM | POA: Diagnosis not present

## 2015-10-05 DIAGNOSIS — M9902 Segmental and somatic dysfunction of thoracic region: Secondary | ICD-10-CM | POA: Diagnosis not present

## 2015-10-05 DIAGNOSIS — F419 Anxiety disorder, unspecified: Secondary | ICD-10-CM | POA: Diagnosis not present

## 2015-10-05 DIAGNOSIS — Z818 Family history of other mental and behavioral disorders: Secondary | ICD-10-CM | POA: Diagnosis not present

## 2015-10-05 DIAGNOSIS — Z833 Family history of diabetes mellitus: Secondary | ICD-10-CM | POA: Diagnosis not present

## 2015-10-05 DIAGNOSIS — F329 Major depressive disorder, single episode, unspecified: Secondary | ICD-10-CM | POA: Diagnosis not present

## 2015-10-05 DIAGNOSIS — Z87891 Personal history of nicotine dependence: Secondary | ICD-10-CM | POA: Diagnosis not present

## 2015-10-05 DIAGNOSIS — Z8249 Family history of ischemic heart disease and other diseases of the circulatory system: Secondary | ICD-10-CM | POA: Diagnosis not present

## 2015-10-05 DIAGNOSIS — G44219 Episodic tension-type headache, not intractable: Secondary | ICD-10-CM | POA: Diagnosis not present

## 2015-10-05 LAB — COMPREHENSIVE METABOLIC PANEL
ALT: 15 U/L (ref 14–54)
AST: 15 U/L (ref 15–41)
Albumin: 4.1 g/dL (ref 3.5–5.0)
Alkaline Phosphatase: 41 U/L (ref 38–126)
Anion gap: 8 (ref 5–15)
BUN: 11 mg/dL (ref 6–20)
CO2: 22 mmol/L (ref 22–32)
Calcium: 8.9 mg/dL (ref 8.9–10.3)
Chloride: 110 mmol/L (ref 101–111)
Creatinine, Ser: 0.83 mg/dL (ref 0.44–1.00)
GFR calc Af Amer: >60 mL/min (ref >60)
GFR calc non Af Amer: >60 mL/min (ref >60)
Glucose, Bld: 104 mg/dL — ABNORMAL HIGH (ref 65–99)
Potassium: 3.7 mmol/L (ref 3.5–5.1)
Sodium: 140 mmol/L (ref 135–145)
Total Bilirubin: 0.5 mg/dL (ref 0.3–1.2)
Total Protein: 6.7 g/dL (ref 6.5–8.1)

## 2015-10-05 LAB — URINE MICROSCOPIC-ADD ON

## 2015-10-05 LAB — TYPE AND SCREEN
ABO/RH(D): A POS
Antibody Screen: NEGATIVE

## 2015-10-05 LAB — URINALYSIS, ROUTINE W REFLEX MICROSCOPIC
Bilirubin Urine: NEGATIVE
Glucose, UA: NEGATIVE mg/dL
Ketones, ur: NEGATIVE mg/dL
Leukocytes, UA: NEGATIVE
Nitrite: NEGATIVE
Protein, ur: NEGATIVE mg/dL
Specific Gravity, Urine: >1.03 — ABNORMAL HIGH (ref 1.005–1.030)
pH: 5.5 (ref 5.0–8.0)

## 2015-10-05 LAB — CBC
HCT: 39 % (ref 36.0–46.0)
Hemoglobin: 13.5 g/dL (ref 12.0–15.0)
MCH: 29 pg (ref 26.0–34.0)
MCHC: 34.6 g/dL (ref 30.0–36.0)
MCV: 83.7 fL (ref 78.0–100.0)
Platelets: 213 10*3/uL (ref 150–400)
RBC: 4.66 MIL/uL (ref 3.87–5.11)
RDW: 12.9 % (ref 11.5–15.5)
WBC: 5.5 10*3/uL (ref 4.0–10.5)

## 2015-10-05 LAB — HCG, QUANTITATIVE, PREGNANCY: hCG, Beta Chain, Quant, S: <1 m[IU]/mL (ref <5)

## 2015-10-05 NOTE — Pre-Procedure Instructions (Signed)
Patient given information to sign up for my chart at home. 

## 2015-10-05 NOTE — Patient Instructions (Signed)
Julie [name]  10/05/2015     @PREFPERIOPPHARMACY @   Your procedure is scheduled on  10/10/2015  Report to [facility] at  5  A.M.  Call this number if you have problems the morning of surgery:  [phone]   Remember:  Do not eat food or drink liquids after midnight.  Take these medicines the morning of surgery with A SIP OF WATER  Xanax, prilosec, propranolol, zoloft, relpax (if needed).   Do not wear jewelry, make-up or nail polish.  Do not wear lotions, powders, or perfumes.  You may wear deodorant.  Do not shave 48 hours prior to surgery.  Men may shave face and neck.  Do not bring valuables to the hospital.  [facility] is not responsible for any belongings or valuables.  Contacts, dentures or bridgework may not be worn into surgery.  Leave your suitcase in the car.  After surgery it may be brought to your room.  For patients admitted to the hospital, discharge time will be determined by your treatment team.  Patients discharged the day of surgery will not be allowed to drive home.   Name and phone number of your driver:   family Special instructions:  none  Please read over the following fact sheets that you were given. Pain Booklet, Surgical Site Infection Prevention, Anesthesia Post-op Instructions and Care and Recovery After Surgery      Abdominal Hysterectomy Abdominal hysterectomy is a surgical procedure to remove your womb (uterus). Your uterus is the muscular organ that contains a developing baby. This surgery is done for many reasons. You may need an abdominal hysterectomy if you have cancer, growths (tumors), long-term pain, or bleeding. You may also have this procedure if your uterus has slipped down into your vagina (uterine prolapse). Depending on why you need an abdominal hysterectomy, you may also have other reproductive organs removed. These could include the part of your vagina that connects with your uterus (cervix), the organs that  make eggs (ovaries), and the tubes that connect the ovaries to the uterus (fallopian tubes). LET [facility] CARE PROVIDER KNOW ABOUT:   Any allergies you have.  All medicines you are taking, including vitamins, herbs, eye drops, creams, and over-the-counter medicines.  Previous problems you or members of your family have had with the use of anesthetics.  Any blood disorders you have.  Previous surgeries you have had.  Medical conditions you have. RISKS AND COMPLICATIONS Generally, this is a safe procedure. However, as with any procedure, problems can occur. Infection is the most common problem after an abdominal hysterectomy. Other possible problems include:  Bleeding.  Formation of blood clots that may break free and travel to your lungs.  Injury to other organs near your uterus.  Nerve injury causing nerve pain.  Decreased interest in sex or pain during sexual intercourse. BEFORE THE PROCEDURE  Abdominal hysterectomy is a major surgical procedure. It can affect the way you feel about yourself. Talk to your health care provider about the physical and emotional changes hysterectomy may cause.  You may need to have blood work and X-rays done before surgery.  Quit smoking if you smoke. Ask your health care provider for help if you are struggling to quit.  Stop taking medicines that thin your blood as directed by your health care provider.  You may be instructed to take antibiotic medicines or laxatives before surgery.  Do not eat or drink anything for  6-8 hours before surgery.  Take your regular medicines with a small sip of water.  Bathe or shower the night or morning before surgery. PROCEDURE  Abdominal hysterectomy is done in the operating room at the hospital.  In most cases, you will be given a medicine that makes you go to sleep (general anesthetic).  The surgeon will make a cut (incision) through the skin in your lower belly.  The incision may be about 5-7  inches long. It may go side-to-side or up-and-down.  The surgeon will move aside the body tissue that covers your uterus. The surgeon will then carefully take out your uterus along with any of your other reproductive organs that need to be removed.  Bleeding will be controlled with clamps or sutures.  The surgeon will close your incision with sutures or metal clips. AFTER THE PROCEDURE  You will have some pain immediately after the procedure.  You will be given pain medicine in the recovery room.  You will be taken to your hospital room when you have recovered from the anesthesia.  You may need to stay in the hospital for 2-5 days.  You will be given instructions for recovery at home.   This information is not intended to replace advice given to you by your health care provider. Make sure you discuss any questions you have with your health care provider.   Document Released: 05/31/2013 Document Reviewed: 05/31/2013 Elsevier Interactive Patient Education 2016 Elsevier Inc. Bilateral Salpingo-Oophorectomy Bilateral salpingo-oophorectomy is the surgical removal of both fallopian tubes and both ovaries. The ovaries are small organs that produce eggs in women. The fallopian tubes transport the egg from the ovary to the womb (uterus). Usually, when this surgery is done, the uterus was previously removed. A bilateral salpingo-oophorectomy may be done to treat cancer or to reduce the risk of cancer in women who are at high risk. Removing both fallopian tubes and both ovaries will make you unable to become pregnant (sterile). It will also put you into menopause so that you will no longer have menstrual periods and may have menopausal symptoms such as hot flashes, night sweats, and mood changes. It will not affect your sex drive. LET [facility] CARE PROVIDER KNOW ABOUT:  Any allergies you have.  All medicines you are taking, including vitamins, herbs, eye drops, creams, and over-the-counter  medicines.  Previous problems you or members of your family have had with the use of anesthetics.  Any blood disorders you have.  Previous surgeries you have had.  Medical conditions you have. RISKS AND COMPLICATIONS Generally, this is a safe procedure. However, as with any procedure, complications can occur. Possible complications include:  Injury to surrounding organs.  Bleeding.  Infection.  Blood clots in the legs or lungs.  Problems related to anesthesia. BEFORE THE PROCEDURE  Ask your health care provider about changing or stopping your regular medicines. You may need to stop taking certain medicines, such as aspirin or blood thinners, at least 1 week before the surgery.  Do not eat or drink anything for at least 8 hours before the surgery.  If you smoke, do not smoke for at least 2 weeks before the surgery.  Make plans to have someone drive you home after the procedure or after your hospital stay. Also arrange for someone to help you with activities during recovery. PROCEDURE   You will be given medicine to help you relax before the procedure (sedative). You will then be given medicine to make you sleep through  the procedure (general anesthetic). These medicines will be given through an IV access tube that is put into one of your veins.  Once you are asleep, your lower abdomen will be shaved and cleaned. A thin, flexible tube (catheter) will be placed in your bladder.  The surgeon may use a laparoscopic, robotic, or open technique for this surgery:  In the laparoscopic technique, the surgery is done through two small cuts (incisions) in the abdomen. A thin, lighted tube with a tiny camera on the end (laparoscope) is inserted into one of the incisions. The tools needed for the procedure are put through the other incision.  A robotic technique may be chosen to perform complex surgery in a small space. In the robotic technique, small incisions will be made. A camera and  surgical instruments are passed through the incisions. Surgical instruments will be controlled with the help of a robotic arm.  In the open technique, the surgery is done through one large incision in the abdomen.  Using any of these techniques, the surgeon removes the fallopian tubes and ovaries. The blood vessels will be clamped and tied.  The surgeon then uses staples or stitches to close the incision or incisions. AFTER THE PROCEDURE  You will be taken to a recovery area where you will be monitored for 1 to 3 hours. Your blood pressure, pulse, and temperature will be checked often. You will remain in the recovery area until you are stable and waking up.  If the laparoscopic technique was used, you may be allowed to go home after several hours. You may have some shoulder pain after the laparoscopic procedure. This is normal and usually goes away in a day or two.  If the open technique was used, you will be admitted to the hospital for a couple of days.  You will be given pain medicine as needed.  The IV access tube and catheter will be removed before you are discharged.   This information is not intended to replace advice given to you by your health care provider. Make sure you discuss any questions you have with your health care provider.   Document Released: 05/26/2005 Document Revised: 05/31/2013 Document Reviewed: 11/17/2012 Elsevier Interactive Patient Education 2016 [place]. Abdominal Hysterectomy, Care After These instructions give you information on caring for yourself after your procedure. Your doctor may also give you more specific instructions. Call your doctor if you have any problems or questions after your procedure.  HOME CARE It takes 4-6 weeks to recover from this surgery. Follow all of your doctor's instructions.   Only take medicines as told by your doctor.  Change your bandage as told by your doctor.  Return to your doctor to have your stitches taken  out.  Take showers for 2-3 weeks. Ask your doctor when it is okay to shower.  Do not douche, use tampons, or have sex (intercourse) for at least 6 weeks or as told.  Follow your doctor's advice about exercise, lifting objects, driving, and general activities.  Get plenty of rest and sleep.  Do not lift anything heavier than a gallon of milk (about 10 pounds [4.5 kilograms]) for the first month after surgery.  Get back to your normal diet as told by your doctor.  Do not drink alcohol until your doctor says it is okay.  Take a medicine to help you poop (laxative) as told by your doctor.  Eating foods high in fiber may help you poop. Eat a lot of raw fruits and  vegetables, whole grains, and beans.  Drink enough fluids to keep your pee (urine) clear or pale yellow.  Have someone help you at home for 1-2 weeks after your surgery.  Keep follow-up doctor visits as told. GET HELP IF:  You have chills or fever.  You have puffiness, redness, or pain in area of the cut (incision).  You have yellowish-white fluid (pus) coming from the cut.  You have a bad smell coming from the cut or bandage.  Your cut pulls apart.  You feel dizzy or light-headed.  You have pain or bleeding when you pee.  You keep having watery poop (diarrhea).  You keep feeling sick to your stomach (nauseous) or keep throwing up (vomiting).  You have fluid (discharge) coming from your vagina.  You have a rash.  You have a reaction to your medicine.  You need stronger pain medicine. GET HELP RIGHT AWAY IF:   You have a fever and your symptoms suddenly get worse.  You have bad belly (abdominal) pain.  You have chest pain.  You are short of breath.  You pass out (faint).  You have pain, puffiness, or redness of your leg.  You bleed a lot from your vagina and notice clumps of tissue (clots). MAKE SURE YOU:   Understand these instructions.  Will watch your condition.  Will get help right away  if you are not doing well or get worse.   This information is not intended to replace advice given to you by your health care provider. Make sure you discuss any questions you have with your health care provider.   Document Released: 03/04/2008 Document Revised: 05/31/2013 Document Reviewed: 03/18/2013 Elsevier Interactive Patient Education 2016 [place] bilateral - Cuidados posteriores (Bilateral Salpingo-Oophorectomy, Care After) Siga estas instrucciones durante las prximas semanas. Estas indicaciones le proporcionan informacin general acerca de cmo deber cuidarse despus del procedimiento. El mdico tambin podr darle instrucciones ms especficas. El tratamiento se ha planificado de acuerdo a las prcticas mdicas actuales, pero a veces se producen problemas. Comunquese con el mdico si tiene algn problema o tiene dudas despus del procedimiento. QU ESPERAR DESPUS DEL PROCEDIMIENTO Despus del procedimiento, es tpico tener las siguientes sensaciones:   Dolor abdominal que puede controlarse con medicamentos.  Prdida o hemorragia vaginal.  Estreimiento.  Sntomas menopusicos como calores, sequedad vaginal y cambios de humor. INSTRUCCIONES PARA EL CUIDADO EN EL HOGAR   Descanse y duerma lo suficiente.  Tome slo medicamentos de venta libre o recetados, segn las indicaciones del mdico. No tome aspirina. Puede ocasionar hemorragias.  Leslie y secas. Retire o [employer] apsitos (vendajes) slo como le indic su mdico.  Tome slo duchas y no baos, durante algunas semanas, segn las indicaciones de su mdico.  Limite las actividades segn las indicaciones del mdico. No levante ningn objeto que sea ms pesado que 5 libras (2.3 kg) hasta que el mdico la autorice.  No conduzca vehculos hasta que el mdico lo autorice.  Siga las indicaciones de su mdico con respecto al tratamiento para el asma antes de hacer  actividad fsica. Puede retomar su dieta habitual inmediatamente.  Beba suficiente lquido para Consulting civil engineer orina clara o de color amarillo plido.  No se haga duchas vaginales ni tenga relaciones sexuales durante las 6 semanas siguientes a la [place].  No beba alcohol hasta que el mdico la autorice.  Tmese la [employer] veces por da y Chartered certified accountant.  Si est constipada podr:   [employer]  a su mdico si puede tomar un laxante suave.  Agregar frutas y salvado a su dieta.  Beber ms lquidos.  Concurra a las consultas de control con su mdico segn las indicaciones. SOLICITE ATENCIN MDICA SI:   La zona de la incisin est roja, se hincha o Engineer, water.  Tiene pus en el sitio de la incisin.  Advierte un olor ftido que proviene de la herida o del vendaje.  Tiene dolor, enrojecimiento o hinchazn en la zona en la que fue colocada la va intravenosa.  La incisin se ha abierto (los bordes no se mantienen juntos).  Se siente mareada o sufre un desmayo.  Siente dolor o tiene una hemorragia al [employer].  Tiene diarrea.  Presenta nuseas o vmitos.  Margette Fast hemorragia vaginal anormal.  Le aparece una erupcin cutnea.  Aumenta el dolor y no puede controlarlo con Conservation officer, nature. SOLICITE ATENCIN MDICA DE INMEDIATO SI:   Tiene fiebre.  Siente dolor abdominal.  Siente dolor en el pecho.  Le falta el aire.  Se desmaya.  Siente dolor, u observa hinchazn o enrojecimiento en la pierna.  Tiene una hemorragia vaginal abundante, con o sin cogulos.   Esta informacin no tiene Marine scientist el consejo del mdico. Asegrese de hacerle al mdico cualquier pregunta que tenga.   Document Released: 08/20/2009 Document Revised: 01/26/2013 Elsevier Interactive Patient Education 2016 [place] POST-ANESTHESIA  IMMEDIATELY FOLLOWING SURGERY:  Do not drive or operate machinery for the first twenty four hours after surgery.  Do not  make any important decisions for twenty four hours after surgery or while taking narcotic pain medications or sedatives.  If you develop intractable nausea and vomiting or a severe headache please notify your doctor immediately.  FOLLOW-UP:  Please make an appointment with your surgeon as instructed. You do not need to follow up with anesthesia unless specifically instructed to do so.  WOUND CARE INSTRUCTIONS (if applicable):  Keep a dry clean dressing on the anesthesia/puncture wound site if there is drainage.  Once the wound has quit draining you may leave it open to air.  Generally you should leave the bandage intact for twenty four hours unless there is drainage.  If the epidural site drains for more than 36-48 hours please call the anesthesia department.  QUESTIONS?:  Please feel free to call your physician or the hospital operator if you have any questions, and they will be happy to assist you.

## 2015-10-08 ENCOUNTER — Telehealth: Payer: Self-pay | Admitting: *Deleted

## 2015-10-08 NOTE — Telephone Encounter (Signed)
Yes should not be a problem

## 2015-10-08 NOTE — Telephone Encounter (Signed)
Pt states she fell this weekend and hit her head on a brick foundation, did not go to ER but has a knot (size of a quarter) on the back of her head and is still sore. Pt states can she still have the hysterectomy procedure this Wednesday, Oct 10, 2015 with Dr.Eure?

## 2015-10-08 NOTE — Telephone Encounter (Signed)
Pt informed per Dr. Elonda Husky is able to proceed with procedure for Oct 10, 2015. Pt verbalized understanding.

## 2015-10-09 DIAGNOSIS — M9902 Segmental and somatic dysfunction of thoracic region: Secondary | ICD-10-CM | POA: Diagnosis not present

## 2015-10-09 DIAGNOSIS — G44219 Episodic tension-type headache, not intractable: Secondary | ICD-10-CM | POA: Diagnosis not present

## 2015-10-09 DIAGNOSIS — M546 Pain in thoracic spine: Secondary | ICD-10-CM | POA: Diagnosis not present

## 2015-10-09 DIAGNOSIS — M9901 Segmental and somatic dysfunction of cervical region: Secondary | ICD-10-CM | POA: Diagnosis not present

## 2015-10-10 ENCOUNTER — Encounter (HOSPITAL_COMMUNITY)
Admission: RE | Disposition: A | Discharge status: Home / Self Care | Payer: Self-pay | Source: Ambulatory Visit | Attending: Obstetrics & Gynecology

## 2015-10-10 ENCOUNTER — Observation Stay (HOSPITAL_COMMUNITY)
Admission: RE | Admit: 2015-10-10 | Discharge: 2015-10-11 | DRG: 743 | Disposition: A | Discharge status: Home / Self Care | Payer: BLUE CROSS/BLUE SHIELD | Source: Ambulatory Visit | Attending: Obstetrics & Gynecology | Admitting: Obstetrics & Gynecology

## 2015-10-10 ENCOUNTER — Inpatient Hospital Stay (HOSPITAL_COMMUNITY): Payer: BLUE CROSS/BLUE SHIELD | Admitting: Anesthesiology

## 2015-10-10 ENCOUNTER — Encounter (HOSPITAL_COMMUNITY): Payer: Self-pay

## 2015-10-10 DIAGNOSIS — N920 Excessive and frequent menstruation with regular cycle: Principal | ICD-10-CM | POA: Insufficient documentation

## 2015-10-10 DIAGNOSIS — N946 Dysmenorrhea, unspecified: Secondary | ICD-10-CM | POA: Insufficient documentation

## 2015-10-10 DIAGNOSIS — Z809 Family history of malignant neoplasm, unspecified: Secondary | ICD-10-CM | POA: Diagnosis not present

## 2015-10-10 DIAGNOSIS — F329 Major depressive disorder, single episode, unspecified: Secondary | ICD-10-CM | POA: Diagnosis not present

## 2015-10-10 DIAGNOSIS — N941 Unspecified dyspareunia: Secondary | ICD-10-CM | POA: Diagnosis not present

## 2015-10-10 DIAGNOSIS — Z8249 Family history of ischemic heart disease and other diseases of the circulatory system: Secondary | ICD-10-CM | POA: Insufficient documentation

## 2015-10-10 DIAGNOSIS — N838 Other noninflammatory disorders of ovary, fallopian tube and broad ligament: Secondary | ICD-10-CM | POA: Diagnosis not present

## 2015-10-10 DIAGNOSIS — K219 Gastro-esophageal reflux disease without esophagitis: Secondary | ICD-10-CM | POA: Insufficient documentation

## 2015-10-10 DIAGNOSIS — Z90710 Acquired absence of both cervix and uterus: Secondary | ICD-10-CM | POA: Diagnosis present

## 2015-10-10 DIAGNOSIS — N809 Endometriosis, unspecified: Secondary | ICD-10-CM | POA: Diagnosis not present

## 2015-10-10 DIAGNOSIS — Z823 Family history of stroke: Secondary | ICD-10-CM | POA: Insufficient documentation

## 2015-10-10 DIAGNOSIS — Z833 Family history of diabetes mellitus: Secondary | ICD-10-CM | POA: Diagnosis not present

## 2015-10-10 DIAGNOSIS — Z818 Family history of other mental and behavioral disorders: Secondary | ICD-10-CM | POA: Diagnosis not present

## 2015-10-10 DIAGNOSIS — Z87891 Personal history of nicotine dependence: Secondary | ICD-10-CM | POA: Insufficient documentation

## 2015-10-10 DIAGNOSIS — F419 Anxiety disorder, unspecified: Secondary | ICD-10-CM | POA: Diagnosis not present

## 2015-10-10 HISTORY — PX: ABDOMINAL HYSTERECTOMY: SHX81

## 2015-10-10 HISTORY — PX: BILATERAL SALPINGECTOMY: SHX5743

## 2015-10-10 SURGERY — HYSTERECTOMY, ABDOMINAL
Anesthesia: General

## 2015-10-10 MED ORDER — SODIUM CHLORIDE 0.9 % IR SOLN
Status: DC | PRN
  Administered 2015-10-10: 1000 mL
  Administered 2015-10-10: 2000 mL

## 2015-10-10 MED ORDER — ALUM & MAG HYDROXIDE-SIMETH 200-200-20 MG/5ML PO SUSP: 30.0000 mL | ORAL | Status: DC | PRN

## 2015-10-10 MED ORDER — EPHEDRINE SULFATE 50 MG/ML IJ SOLN
INTRAMUSCULAR | Status: AC
  Filled 2015-10-10: qty 1

## 2015-10-10 MED ORDER — CEFAZOLIN SODIUM-DEXTROSE 2-4 GM/100ML-% IV SOLN
INTRAVENOUS | Status: AC
  Filled 2015-10-10: qty 100

## 2015-10-10 MED ORDER — KCL IN DEXTROSE-NACL 20-5-0.45 MEQ/L-%-% IV SOLN
INTRAVENOUS | Status: DC
  Administered 2015-10-10 – 2015-10-11 (×3): via INTRAVENOUS
  Filled 2015-10-10 (×4): qty 1000

## 2015-10-10 MED ORDER — EPHEDRINE SULFATE 50 MG/ML IJ SOLN
INTRAMUSCULAR | Status: DC | PRN
  Administered 2015-10-10: 5 mg via INTRAVENOUS

## 2015-10-10 MED ORDER — ONDANSETRON HCL 4 MG PO TABS
8.0000 mg | ORAL_TABLET | Freq: Four times a day (QID) | ORAL | Status: DC | PRN
  Administered 2015-10-10: 8 mg via ORAL
  Filled 2015-10-10 (×2): qty 2

## 2015-10-10 MED ORDER — SENNOSIDES-DOCUSATE SODIUM 8.6-50 MG PO TABS: 1.0000 | ORAL_TABLET | Freq: Every evening | ORAL | Status: DC | PRN

## 2015-10-10 MED ORDER — BISACODYL 10 MG RE SUPP: 10.0000 mg | Freq: Every day | RECTAL | Status: DC | PRN

## 2015-10-10 MED ORDER — GLYCOPYRROLATE 0.2 MG/ML IJ SOLN
INTRAMUSCULAR | Status: AC
  Filled 2015-10-10: qty 3

## 2015-10-10 MED ORDER — ZOLPIDEM TARTRATE 5 MG PO TABS: 10.0000 mg | ORAL_TABLET | Freq: Every evening | ORAL | Status: DC | PRN

## 2015-10-10 MED ORDER — FENTANYL CITRATE (PF) 100 MCG/2ML IJ SOLN
INTRAMUSCULAR | Status: DC | PRN
  Administered 2015-10-10 (×2): 50 ug via INTRAVENOUS
  Administered 2015-10-10: 100 ug via INTRAVENOUS
  Administered 2015-10-10 (×3): 50 ug via INTRAVENOUS

## 2015-10-10 MED ORDER — MIDAZOLAM HCL 5 MG/5ML IJ SOLN
INTRAMUSCULAR | Status: DC | PRN
  Administered 2015-10-10: 2 mg via INTRAVENOUS

## 2015-10-10 MED ORDER — CEFAZOLIN SODIUM-DEXTROSE 2-4 GM/100ML-% IV SOLN
2.0000 g | INTRAVENOUS | Status: AC
  Administered 2015-10-10: 2 g via INTRAVENOUS

## 2015-10-10 MED ORDER — PROPRANOLOL HCL 20 MG PO TABS
80.0000 mg | ORAL_TABLET | Freq: Two times a day (BID) | ORAL | Status: DC
  Administered 2015-10-10 – 2015-10-11 (×3): 80 mg via ORAL
  Filled 2015-10-10: qty 2
  Filled 2015-10-10: qty 4
  Filled 2015-10-10: qty 2
  Filled 2015-10-10: qty 4
  Filled 2015-10-10 (×2): qty 2
  Filled 2015-10-10: qty 4
  Filled 2015-10-10: qty 2

## 2015-10-10 MED ORDER — ROCURONIUM BROMIDE 50 MG/5ML IV SOLN
INTRAVENOUS | Status: AC
  Filled 2015-10-10: qty 2

## 2015-10-10 MED ORDER — ROCURONIUM BROMIDE 100 MG/10ML IV SOLN
INTRAVENOUS | Status: DC | PRN
  Administered 2015-10-10: 45 mg via INTRAVENOUS
  Administered 2015-10-10: 5 mg via INTRAVENOUS
  Administered 2015-10-10 (×2): 10 mg via INTRAVENOUS

## 2015-10-10 MED ORDER — PANTOPRAZOLE SODIUM 40 MG PO TBEC
40.0000 mg | DELAYED_RELEASE_TABLET | Freq: Every day | ORAL | Status: DC
  Administered 2015-10-10 – 2015-10-11 (×2): 40 mg via ORAL
  Filled 2015-10-10 (×2): qty 1

## 2015-10-10 MED ORDER — FENTANYL CITRATE (PF) 100 MCG/2ML IJ SOLN
25.0000 ug | INTRAMUSCULAR | Status: DC | PRN
  Administered 2015-10-10: 50 ug via INTRAVENOUS
  Filled 2015-10-10: qty 2

## 2015-10-10 MED ORDER — HYDROMORPHONE HCL 1 MG/ML IJ SOLN
1.0000 mg | INTRAMUSCULAR | Status: DC | PRN
  Administered 2015-10-10 (×3): 2 mg via INTRAVENOUS
  Filled 2015-10-10 (×3): qty 2

## 2015-10-10 MED ORDER — SERTRALINE HCL 50 MG PO TABS
100.0000 mg | ORAL_TABLET | Freq: Every morning | ORAL | Status: DC
  Administered 2015-10-11: 100 mg via ORAL
  Filled 2015-10-10: qty 1
  Filled 2015-10-10: qty 2
  Filled 2015-10-10: qty 1

## 2015-10-10 MED ORDER — SIMETHICONE 80 MG PO CHEW
80.0000 mg | CHEWABLE_TABLET | Freq: Four times a day (QID) | ORAL | Status: DC | PRN
  Administered 2015-10-11: 80 mg via ORAL
  Filled 2015-10-10: qty 1

## 2015-10-10 MED ORDER — KETOROLAC TROMETHAMINE 30 MG/ML IJ SOLN
INTRAMUSCULAR | Status: AC
  Filled 2015-10-10: qty 1

## 2015-10-10 MED ORDER — DOCUSATE SODIUM 100 MG PO CAPS
100.0000 mg | ORAL_CAPSULE | Freq: Two times a day (BID) | ORAL | Status: DC
  Administered 2015-10-10 – 2015-10-11 (×3): 100 mg via ORAL
  Filled 2015-10-10 (×3): qty 1

## 2015-10-10 MED ORDER — ALPRAZOLAM 0.5 MG PO TABS
0.5000 mg | ORAL_TABLET | Freq: Three times a day (TID) | ORAL | Status: DC | PRN
  Administered 2015-10-11: 1 mg via ORAL
  Filled 2015-10-10: qty 2

## 2015-10-10 MED ORDER — DIPHENHYDRAMINE HCL 25 MG PO CAPS
25.0000 mg | ORAL_CAPSULE | ORAL | Status: DC | PRN
  Administered 2015-10-10: 50 mg via ORAL
  Filled 2015-10-10: qty 2

## 2015-10-10 MED ORDER — ELETRIPTAN HYDROBROMIDE 20 MG PO TABS
40.0000 mg | ORAL_TABLET | ORAL | Status: DC | PRN
  Filled 2015-10-10: qty 1

## 2015-10-10 MED ORDER — LIDOCAINE HCL 1 % IJ SOLN
INTRAMUSCULAR | Status: DC | PRN
  Administered 2015-10-10: 30 mg via INTRADERMAL

## 2015-10-10 MED ORDER — LACTATED RINGERS IV SOLN
INTRAVENOUS | Status: DC
  Administered 2015-10-10 (×4): via INTRAVENOUS

## 2015-10-10 MED ORDER — ATROPINE SULFATE 0.4 MG/ML IJ SOLN
INTRAMUSCULAR | Status: AC
  Filled 2015-10-10: qty 1

## 2015-10-10 MED ORDER — BUPIVACAINE LIPOSOME 1.3 % IJ SUSP: 20.0000 mL | Freq: Once | INTRAMUSCULAR | Status: DC

## 2015-10-10 MED ORDER — LIDOCAINE HCL (PF) 1 % IJ SOLN
INTRAMUSCULAR | Status: AC
  Filled 2015-10-10: qty 5

## 2015-10-10 MED ORDER — BUPIVACAINE LIPOSOME 1.3 % IJ SUSP
INTRAMUSCULAR | Status: AC
  Filled 2015-10-10: qty 20

## 2015-10-10 MED ORDER — PROPOFOL 10 MG/ML IV BOLUS
INTRAVENOUS | Status: AC
  Filled 2015-10-10: qty 20

## 2015-10-10 MED ORDER — NEOSTIGMINE METHYLSULFATE 10 MG/10ML IV SOLN
INTRAVENOUS | Status: DC | PRN
  Administered 2015-10-10: 3 mg via INTRAVENOUS

## 2015-10-10 MED ORDER — GLYCOPYRROLATE 0.2 MG/ML IJ SOLN
INTRAMUSCULAR | Status: DC | PRN
  Administered 2015-10-10: 0.6 mg via INTRAVENOUS

## 2015-10-10 MED ORDER — BUPIVACAINE LIPOSOME 1.3 % IJ SUSP
INTRAMUSCULAR | Status: DC | PRN
  Administered 2015-10-10: 20 mL

## 2015-10-10 MED ORDER — PROPOFOL 10 MG/ML IV BOLUS
INTRAVENOUS | Status: DC | PRN
  Administered 2015-10-10: 150 mg via INTRAVENOUS

## 2015-10-10 MED ORDER — MIDAZOLAM HCL 2 MG/2ML IJ SOLN
1.0000 mg | INTRAMUSCULAR | Status: DC | PRN
  Administered 2015-10-10: 2 mg via INTRAVENOUS
  Filled 2015-10-10: qty 2

## 2015-10-10 MED ORDER — MIDAZOLAM HCL 2 MG/2ML IJ SOLN
INTRAMUSCULAR | Status: AC
  Filled 2015-10-10: qty 2

## 2015-10-10 MED ORDER — TOPIRAMATE 25 MG PO TABS
25.0000 mg | ORAL_TABLET | Freq: Every day | ORAL | Status: DC
  Administered 2015-10-10: 25 mg via ORAL
  Filled 2015-10-10 (×2): qty 1

## 2015-10-10 MED ORDER — SODIUM CHLORIDE 0.9 % IJ SOLN
INTRAMUSCULAR | Status: AC
  Filled 2015-10-10: qty 10

## 2015-10-10 MED ORDER — ONDANSETRON HCL 4 MG/2ML IJ SOLN: 4.0000 mg | Freq: Once | INTRAMUSCULAR | Status: DC | PRN

## 2015-10-10 MED ORDER — OXYCODONE-ACETAMINOPHEN 5-325 MG PO TABS
1.0000 | ORAL_TABLET | ORAL | Status: DC | PRN
  Administered 2015-10-10: 2 via ORAL
  Administered 2015-10-11 (×3): 1 via ORAL
  Filled 2015-10-10 (×3): qty 1
  Filled 2015-10-10: qty 2

## 2015-10-10 MED ORDER — ONDANSETRON HCL 4 MG/2ML IJ SOLN
4.0000 mg | Freq: Once | INTRAMUSCULAR | Status: AC
  Administered 2015-10-10: 4 mg via INTRAVENOUS
  Filled 2015-10-10: qty 2

## 2015-10-10 MED ORDER — KETOROLAC TROMETHAMINE 30 MG/ML IJ SOLN
30.0000 mg | Freq: Once | INTRAMUSCULAR | Status: AC
  Administered 2015-10-10: 30 mg via INTRAVENOUS
  Filled 2015-10-10: qty 1

## 2015-10-10 MED ORDER — SODIUM CHLORIDE 0.9 % IV SOLN
8.0000 mg | Freq: Four times a day (QID) | INTRAVENOUS | Status: DC | PRN
  Filled 2015-10-10: qty 4

## 2015-10-10 MED ORDER — FENTANYL CITRATE (PF) 250 MCG/5ML IJ SOLN
INTRAMUSCULAR | Status: AC
  Filled 2015-10-10: qty 10

## 2015-10-10 SURGICAL SUPPLY — 47 items
APPLIER CLIP 13 LRG OPEN (CLIP)
APR CLP LRG 13 20 CLIP (CLIP)
BAG HAMPER (MISCELLANEOUS) ×3 IMPLANT
CELLS DAT CNTRL 66122 CELL SVR (MISCELLANEOUS) IMPLANT
CLIP APPLIE 13 LRG OPEN (CLIP) IMPLANT
CLOTH BEACON ORANGE TIMEOUT ST (SAFETY) ×3 IMPLANT
COVER LIGHT HANDLE STERIS (MISCELLANEOUS) ×6 IMPLANT
DRAPE WARM FLUID 44X44 (DRAPE) ×3 IMPLANT
DRSG OPSITE POSTOP 4X8 (GAUZE/BANDAGES/DRESSINGS) ×3 IMPLANT
DRSG TELFA 3X8 NADH (GAUZE/BANDAGES/DRESSINGS) IMPLANT
ELECT REM PT RETURN 9FT ADLT (ELECTROSURGICAL) ×3
ELECTRODE REM PT RTRN 9FT ADLT (ELECTROSURGICAL) ×2 IMPLANT
FORMALIN 10 PREFIL 480ML (MISCELLANEOUS) ×3 IMPLANT
GLOVE BIOGEL PI IND STRL 7.0 (GLOVE) ×2 IMPLANT
GLOVE BIOGEL PI IND STRL 8 (GLOVE) ×2 IMPLANT
GLOVE BIOGEL PI INDICATOR 7.0 (GLOVE) ×6
GLOVE BIOGEL PI INDICATOR 8 (GLOVE) ×1
GLOVE ECLIPSE 8.0 STRL XLNG CF (GLOVE) ×3 IMPLANT
GOWN STRL REUS W/TWL LRG LVL3 (GOWN DISPOSABLE) ×6 IMPLANT
GOWN STRL REUS W/TWL XL LVL3 (GOWN DISPOSABLE) ×3 IMPLANT
INST SET MAJOR GENERAL (KITS) ×3 IMPLANT
KIT ROOM TURNOVER APOR (KITS) ×3 IMPLANT
LIQUID BAND (GAUZE/BANDAGES/DRESSINGS) ×1 IMPLANT
MANIFOLD NEPTUNE II (INSTRUMENTS) ×3 IMPLANT
NDL HYPO 21X1.5 SAFETY (NEEDLE) ×2 IMPLANT
NEEDLE HYPO 21X1.5 SAFETY (NEEDLE) ×3 IMPLANT
NS IRRIG 1000ML POUR BTL (IV SOLUTION) ×7 IMPLANT
PACK ABDOMINAL MAJOR (CUSTOM PROCEDURE TRAY) ×3 IMPLANT
PAD ARMBOARD 7.5X6 YLW CONV (MISCELLANEOUS) ×3 IMPLANT
PAD DRESSING TELFA 3X8 NADH (GAUZE/BANDAGES/DRESSINGS) ×2 IMPLANT
RETRACTOR WND ALEXIS 18 MED (MISCELLANEOUS) IMPLANT
RETRACTOR WND ALEXIS 25 LRG (MISCELLANEOUS) IMPLANT
RTRCTR WOUND ALEXIS 18CM MED (MISCELLANEOUS)
RTRCTR WOUND ALEXIS 25CM LRG (MISCELLANEOUS) ×3
SET BASIN LINEN APH (SET/KITS/TRAYS/PACK) ×3 IMPLANT
SPONGE LAP 18X18 X RAY DECT (DISPOSABLE) ×1 IMPLANT
SUT CHROMIC 0 CT 1 (SUTURE) ×4 IMPLANT
SUT MON AB 3-0 SH 27 (SUTURE) ×1 IMPLANT
SUT PLAIN 2 0 XLH (SUTURE) IMPLANT
SUT VIC AB 0 CT1 27 (SUTURE) ×12
SUT VIC AB 0 CT1 27XCR 8 STRN (SUTURE) ×4 IMPLANT
SUT VIC AB 0 CTX 36 (SUTURE) ×3
SUT VIC AB 0 CTX36XBRD ANTBCTR (SUTURE) ×2 IMPLANT
SUT VICRYL 3 0 (SUTURE) ×3 IMPLANT
SYR 20CC LL (SYRINGE) ×3 IMPLANT
TOWEL BLUE STERILE X RAY DET (MISCELLANEOUS) ×2 IMPLANT
TRAY FOLEY CATH SILVER 16FR (SET/KITS/TRAYS/PACK) ×3 IMPLANT

## 2015-10-10 NOTE — Anesthesia Postprocedure Evaluation (Signed)
Anesthesia Post Note  Patient: Julie [name]  Procedure(s) Performed: Procedure(s) (LRB): HYSTERECTOMY ABDOMINAL (N/A) BILATERAL SALPINGECTOMY (Bilateral)  Patient location during evaluation: PACU Anesthesia Type: General Level of consciousness: awake, oriented and patient cooperative Pain management: pain level controlled Vital Signs Assessment: post-procedure vital signs reviewed and stable Respiratory status: spontaneous breathing, nonlabored ventilation and respiratory function stable Cardiovascular status: blood pressure returned to baseline Postop Assessment: no signs of nausea or vomiting Anesthetic complications: no    Last Vitals:  Filed Vitals:   10/10/15 0805 10/10/15 0810  BP: 100/65 99/54  Pulse:    Temp:    Resp: 11 16    Last Pain: There were no vitals filed for this visit.               Madsen Riddle J

## 2015-10-10 NOTE — Anesthesia Procedure Notes (Signed)
Procedure Name: Intubation Date/Time: 10/10/2015 8:30 AM Performed by: Charmaine Downs Pre-anesthesia Checklist: Emergency Drugs available, Patient identified, Suction available and Patient being monitored Patient Re-evaluated:Patient Re-evaluated prior to inductionOxygen Delivery Method: Circle system utilized Preoxygenation: Pre-oxygenation with 100% oxygen Intubation Type: IV induction and Cricoid Pressure applied Ventilation: Mask ventilation without difficulty and Oral airway inserted - appropriate to patient size Laryngoscope Size: Mac and 3 Grade View: Grade II Tube type: Oral Tube size: 7.0 mm Number of attempts: 1 Airway Equipment and Method: Stylet and Oral airway Placement Confirmation: ETT inserted through vocal cords under direct vision,  positive ETCO2 and breath sounds checked- equal and bilateral Secured at: 22 cm Tube secured with: Tape Dental Injury: Teeth and Oropharynx as per pre-operative assessment

## 2015-10-10 NOTE — Anesthesia Preprocedure Evaluation (Signed)
Anesthesia Evaluation  Patient identified by MRN, date of birth, ID band Patient awake    Reviewed: Allergy & Precautions, NPO status   History of Anesthesia Complications Negative for: history of anesthetic complications  Airway Mallampati: II  TM Distance: >3 FB Neck ROM: Full    Dental  (+) Teeth Intact, Dental Advisory Given   Pulmonary former smoker,    Pulmonary exam normal        Cardiovascular negative cardio ROS Normal cardiovascular exam     Neuro/Psych  Headaches, PSYCHIATRIC DISORDERS Anxiety Depression    GI/Hepatic Neg liver ROS, GERD  Controlled,  Endo/Other  negative endocrine ROS  Renal/GU      Musculoskeletal   Abdominal   Peds  Hematology   Anesthesia Other Findings   Reproductive/Obstetrics                             Anesthesia Physical Anesthesia Plan  ASA: II  Anesthesia Plan: General   Post-op Pain Management:    Induction: Intravenous, Rapid sequence and Cricoid pressure planned  Airway Management Planned: Oral ETT  Additional Equipment:   Intra-op Plan:   Post-operative Plan: Extubation in OR  Informed Consent: I have reviewed the patients History and Physical, chart, labs and discussed the procedure including the risks, benefits and alternatives for the proposed anesthesia with the patient or authorized representative who has indicated his/her understanding and acceptance.     Plan Discussed with:   Anesthesia Plan Comments:         Anesthesia Quick Evaluation

## 2015-10-10 NOTE — Interval H&P Note (Signed)
History and Physical Interval Note:  10/10/2015 7:56 AM  [name]  has presented today for surgery, with the diagnosis of menorrhagia dysmenorrhea dyspareunia thickend endometrium  The various methods of treatment have been discussed with the patient and family. After consideration of risks, benefits and other options for treatment, the patient has consented to  Procedure(s): HYSTERECTOMY ABDOMINAL (N/A) BILATERAL SALPINGECTOMY (Bilateral) as a surgical intervention .  The patient's history has been reviewed, patient examined, no change in status, stable for surgery.  I have reviewed the patient's chart and labs.  Questions were answered to the patient's satisfaction.     Julie [name]

## 2015-10-10 NOTE — H&P (Signed)
Preoperative History and Physical  Julie [name] is a 40 y.o. [phone] with Patient's last menstrual period was 09/26/2015 (exact date). admitted for a abdominal hysterectomy including removal of the cervix and removal of both Fallopian tubes prophylactically.  Pt has heavy 7 days of bleeding with her menses with severe cramping. Sonogram does not reveal any anatomical abnormalities. Additionally she has midline bump dyspareunia with intercourse 100% of encounters, has to stop every time and severely limits her sexual life. The pain is recreated with the least movement of the cervix as with the vaginal probe sonogram. She also has midline pelvic pain which she states feels like tearing or ripping in the area of her previous Caesarean section scar.  Due to her significant sexual impairment due to cervical bumping, an ablation and tubal which would solve her dysmenorrhea and sterilization needs, will not address the dysparunia  Discussed options at length and pt agrees with plan of abdominal hysterectomy including cervix with removal o f the Fallopian tubes  PMH:  Past Medical History  Diagnosis Date  . Ectopic pregnancy   . Urinary tract infection   . Placenta previa 2012    Current pregnancy   . Depression   . Anxiety   . GERD (gastroesophageal reflux disease)     no meds  . Eczema   . Anemia   . Breast mass, right 10/20/2012  . Hernia of abdominal wall 01/04/2013  . Breast discharge 07/01/2013    Pt noticed discharge, none on exam today will check TSH and Prolactin  . Vaginal discharge 07/01/2013  . Headache   . Left breast mass 04/12/2014    Has pea sized nodule at 6 o'clock tender and mobile, will get mammogram and [place] if needed  . Pelvic pain in female 09/19/2015  . Menorrhagia with regular cycle 09/19/2015  . Dyspareunia in female 09/19/2015  . Dysmenorrhea 09/19/2015  . Anemia     PSH:  Past Surgical  History  Procedure Laterality Date  . Tonsillectomy    . Appendectomy    . Cholecystectomy    . Appendectomy    . Cesarean section  04/24/2011    Procedure: CESAREAN SECTION; Surgeon: Florian Buff, MD; Location: [facility]; Service: Gynecology; Laterality: N/A; Primary/Previa  . Breast biopsy Right   . Cystoscopy w/ ureteral stent placement Left 08/04/2014    Procedure: CYSTOSCOPY WITH LEFT RETROGRADE PYELOGRAM/ LEFT URETERAL STENT PLACEMENT; Surgeon: Festus Aloe, MD; Location: [facility]; Service: Urology; Laterality: Left;    POb/GynH:  OB History    Gravida Para Term Preterm AB TAB SAB Ectopic Multiple Living   2 1 0 1 1 0 0 1 0 1       SH:  Social History  Substance Use Topics  . Smoking status: Former Smoker -- 14 years    Types: Cigarettes    Quit date: 09/08/2010  . Smokeless tobacco: Never Used  . Alcohol Use: Yes     Comment: occ mixed drink    FH:  Family History  Problem Relation Age of Onset  . Depression Mother   . Cancer Mother   . Seizures Mother   . Bipolar disorder Mother   . Hypertension Father   . Heart disease Maternal Grandmother   . Cancer Maternal Grandmother     oral  . Hypertension Maternal Grandmother   . Other Maternal Grandmother     memory problems  . Stroke Paternal Grandfather   . Hypertension Paternal Grandfather   . Other Paternal Grandfather  brain tumors  . Heart disease Maternal Grandfather   . Pancreatitis Maternal Grandfather   . Cancer Paternal Grandmother     breast  . Heart disease Paternal Grandmother   . Kidney disease Paternal Grandmother     kidney failure  . Diabetes Paternal Grandmother      Allergies:  Allergies  Allergen Reactions  . Clarithromycin Other (See Comments)    Makes throat raw     Medications:  Current outpatient prescriptions:  . ALPRAZolam (XANAX) 1 MG tablet, Take 0.5-1 mg by mouth 3 (three) times daily as needed for anxiety., Disp: , Rfl:  . eletriptan (RELPAX) 40 MG tablet, Take 40 mg by mouth as needed for migraine or headache. May repeat in 2 hours if headache persists or recurs., Disp: , Rfl:  . ferrous sulfate 325 (65 FE) MG tablet, Take 325 mg by mouth 2 (two) times daily with a meal., Disp: , Rfl:  . omeprazole (PRILOSEC) 20 MG capsule, Take 20 mg by mouth daily. , Disp: , Rfl: 4 . propranolol (INDERAL) 80 MG tablet, Take 80 mg by mouth 2 (two) times daily., Disp: , Rfl:  . sertraline (ZOLOFT) 100 MG tablet, Take 100 mg by mouth every morning., Disp: , Rfl:  . topiramate (TOPAMAX) 25 MG capsule, Take 25 mg by mouth at bedtime., Disp: , Rfl:  . HYDROcodone-acetaminophen (NORCO/VICODIN) 5-325 MG tablet, Take 1 tablet by mouth every 6 (six) hours as needed. (Patient not taking: Reported on 10/04/2015), Disp: 30 tablet, Rfl: 0  Review of Systems:   Review of Systems  Constitutional: Negative for fever, chills, weight loss, malaise/fatigue and diaphoresis.  HENT: Negative for hearing loss, ear pain, nosebleeds, congestion, sore throat, neck pain, tinnitus and ear discharge.  Eyes: Negative for blurred vision, double vision, photophobia, pain, discharge and redness.  Respiratory: Negative for cough, hemoptysis, sputum production, shortness of breath, wheezing and stridor.  Cardiovascular: Negative for chest pain, palpitations, orthopnea, claudication, leg swelling and PND.  Gastrointestinal: Positive for abdominal pain. Negative for heartburn, nausea, vomiting, diarrhea, constipation, blood in stool and melena.  Genitourinary: Negative for dysuria, urgency, frequency, hematuria and flank pain.  Musculoskeletal: Negative for myalgias, back pain, joint pain and falls.  Skin: Negative for itching and rash.  Neurological: Negative for  dizziness, tingling, tremors, sensory change, speech change, focal weakness, seizures, loss of consciousness, weakness and headaches.  Endo/Heme/Allergies: Negative for environmental allergies and polydipsia. Does not bruise/bleed easily.  Psychiatric/Behavioral: Negative for depression, suicidal ideas, hallucinations, memory loss and substance abuse. The patient is not nervous/anxious and does not have insomnia.     PHYSICAL EXAM:  Blood pressure 120/80, height 5\' 7"  (1.702 m), weight 279 lb (126.554 kg), last menstrual period 09/26/2015.   Vitals reviewed. Constitutional: She is oriented to person, place, and time. She appears well-developed and well-nourished.  HENT:  Head: Normocephalic and atraumatic.  Right Ear: External ear normal.  Left Ear: External ear normal.  Nose: Nose normal.  Mouth/Throat: Oropharynx is clear and moist.  Eyes: Conjunctivae and EOM are normal. Pupils are equal, round, and reactive to light. Right eye exhibits no discharge. Left eye exhibits no discharge. No scleral icterus.  Neck: Normal range of motion. Neck supple. No tracheal deviation present. No thyromegaly present.  Cardiovascular: Normal rate, regular rhythm, normal heart sounds and intact distal pulses. Exam reveals no gallop and no friction rub.  No murmur heard. Respiratory: Effort normal and breath sounds normal. No respiratory distress. She has no wheezes. She has no rales. She exhibits no  tenderness.  GI: Soft. Bowel sounds are normal. She exhibits no distension and no mass. There is tenderness. There is no rebound and no guarding.  Genitourinary:   Vulva is normal without lesions Vagina is pink moist without discharge Cervix normal in appearance and pap is normal, + pain with bumping Uterus is normal size shape tender to palpation and movement in midline, could be adherent to the anterior abdominal wall Adnexa is negative with normal sized ovaries by sonogram  Musculoskeletal:  Normal range of motion. She exhibits no edema and no tenderness.  Neurological: She is alert and oriented to person, place, and time. She has normal reflexes. She displays normal reflexes. No cranial nerve deficit. She exhibits normal muscle tone. Coordination normal.  Skin: Skin is warm and dry. No rash noted. No erythema. No pallor.  Psychiatric: She has a normal mood and affect. Her behavior is normal. Judgment and thought content normal.    Labs: No results found for this or any previous visit (from the past 336 hour(s)).  EKG: Orders placed or performed during the hospital encounter of 11/15/13  . ED EKG  . ED EKG  . ED EKG  . ED EKG  . EKG 12-Lead  . EKG 12-Lead  . EKG    Imaging Studies:  Imaging Results    US Transvaginal Non-ob  09/26/2015 GYNECOLOGIC SONOGRAM Saliah Palombi is a 40 y.o. [id number] LMP 09/26/2015 for a pelvic sonogram for pelvic pain and menorrhagia. Uterus 8.8 x 5.5 x 4.2 cm, homogenous anteverted uterus Endometrium 17.8 mm, symmetrical, thickened endometrium (secretory phase) Right ovary 2.4 x 2.5 x 1.6 cm, wnl Left ovary 2.8 x 2.6 x 1.8 cm, wnl No free fluid,no pain during ultrasound Technician Comments: PELVIC US TA/TV: homogenous anteverted uterus, thickened EEC 17.8 mm (pt menstrual cycle started today),normal ov's bilat (mobile),no free fluid or pain during ultrasound. Silver Huguenin 09/26/2015 3:07 PM   US Pelvis Complete  09/26/2015 GYNECOLOGIC SONOGRAM Locklyn Lizotte is a 40 y.o. [id number] LMP 09/26/2015 for a pelvic sonogram for pelvic pain and menorrhagia. Uterus 8.8 x 5.5 x 4.2 cm, homogenous anteverted uterus Endometrium 17.8 mm, symmetrical, thickened endometrium (secretory phase) Right ovary 2.4 x 2.5 x 1.6 cm, wnl Left ovary 2.8 x 2.6 x 1.8 cm, wnl No free fluid,no pain during ultrasound Technician Comments: PELVIC US TA/TV:  homogenous anteverted uterus, thickened EEC 17.8 mm (pt menstrual cycle started today),normal ov's bilat (mobile),no free fluid or pain during ultrasound. Amber Heide Guile 09/26/2015 3:07 PM       Assessment: Menorrhagia with regular cycle  Dysmenorrhea  Dyspareunia in female    Patient Active Problem List   Diagnosis Date Noted  . Pelvic pain in female 09/19/2015  . Menorrhagia with regular cycle 09/19/2015  . Dyspareunia in female 09/19/2015  . Dysmenorrhea 09/19/2015  . Left breast mass 04/12/2014  . Breast discharge 07/01/2013  . Vaginal discharge 07/01/2013  . Hernia of abdominal wall 01/04/2013  . Breast mass, right 10/20/2012  . Ectopic pregnancy     Plan: Abdominal hysterectomy with removal of tubes for indications listed above  Preservation of her ovaries  Danyle Boening H 10/04/2015 2:50 PM

## 2015-10-10 NOTE — Op Note (Signed)
Preoperative diagnosis:  1.  menorrhagia                                          2.  dysmenorrhea                                         3.  dyspareunia                                         4.    Postoperative diagnosis:  Same as above + 4 spots of endometriosis  Procedure:  Abdominal hysterectomy, with removal of the cervix and removal of both tubes  Surgeon:  Florian Buff  Assistant:    Anesthesia:  General endotracheal  Preoperative clinical summary: Julie [name] is a 40 y.o. G2P0111 with Patient's last menstrual period was 09/26/2015 (exact date). admitted for a abdominal hysterectomy including removal of the cervix and removal of both Fallopian tubes prophylactically.  Pt has heavy 7 days of bleeding with her menses with severe cramping. Sonogram does not reveal any anatomical abnormalities. Additionally she has midline bump dyspareunia with intercourse 100% of encounters, has to stop every time and severely limits her sexual life. The pain is recreated with the least movement of the cervix as with the vaginal probe sonogram. She also has midline pelvic pain which she states feels like tearing or ripping in the area of her previous Caesarean section scar.  Due to her significant sexual impairment due to cervical bumping, an ablation and tubal which would solve her dysmenorrhea and sterilization needs, will not address the dysparunia   Intraoperative findings: 4 spots of endometriosis, no adhesions ovaries normal  Description of operation:  Patient was taken to the operating room and placed in the supine position where she underwent general endotracheal anesthesia.  She was then prepped and draped in the usual sterile fashion and a Foley catheter was placed for continuous bladder drainage.  A Pfannenstiel skin incision was made and carried down sharply to the rectus fascia which was scored in the midline and extended laterally.  The fascia was taken off the muscles superiorly  and inferiorly without difficulty.  The muscles were divided.  The peritoneal cavity was entered.  An medium Alexis self-retaining retractor was placed.  The upper abdomen was packed away. Both uterine cornu were grasped with Coker clamps.  The left round ligament was suture ligated and coagulated with the electrocautery unit.  The left vesicouterine serosal flap was created.  An avascular window in in the peritoneum was created and the utero-ovarian ligament was cross clamped, cut and suture ligated.  The right round ligament was suture ligated and cut with the electrocautery unit.  The vesicouterine serosal flap on the right was created.  An avascular window in the peritoneum was created and the right utero-ovarian ligament was cross clamped, cut and double suture ligated.  Thus both ovaries were preserved.  The uterine vessels were skeletonized bilaterally.  The uterine vessels were clamped bilaterally,  then cut and suture ligated.  Two more pedicles were taken down the cervix medial to the uterine vessels.  Each pedicle was clamped cut and suture ligated with good resulting hemostasis.  The vagina  was cross clamped and the uterus and cervix was removed.  The vagina was closed with interrupted figure of 8 sutures with good hemostasis, The GFallopian tubes were removed in total and suture ligated.  The pelvis was irrigated vigorously and all pedicles were examined and found to be hemostatic.   All specimens were sent to pathology for routine evaluation.  The Alexis self-retaining retractor was removed and the pelvis was irrigated vigorously.  All packs were removed and all counts were correct at this point x 3.  The muscles and peritoneum were reapproximated loosely.  The fascia was closed with 0 Vicryl running.    The skin was closed using 3-0 Vicryl on a Keith needle in a subcuticular fashion.  Dermabond was then applied for additional wound integrity and to serve as a postoperative bacterial barrier.  The  patient was awakened from anesthesia taken to the recovery room in good stable condition. All sponge instrument and needle counts were correct x 3.  The patient received Ancef and Toradol prophylactically preoperatively.  Estimated blood loss for the procedure was 150  cc.  Julie [name] 10/10/2015 10:54 AM  10:04 AM

## 2015-10-10 NOTE — Transfer of Care (Signed)
Immediate Anesthesia Transfer of Care Note  Patient: Julie [name]  Procedure(s) Performed: Procedure(s): HYSTERECTOMY ABDOMINAL (N/A) BILATERAL SALPINGECTOMY (Bilateral)  Patient Location: PACU  Anesthesia Type:General  Level of Consciousness: awake and patient cooperative  Airway & Oxygen Therapy: Patient Spontanous Breathing and Patient connected to face mask oxygen  Post-op Assessment: Report given to RN, Post -op Vital signs reviewed and stable and Patient moving all extremities  Post vital signs: Reviewed and stable  Last Vitals:  Filed Vitals:   10/10/15 0805 10/10/15 0810  BP: 100/65 99/54  Pulse:    Temp:    Resp: 11 16    Last Pain: There were no vitals filed for this visit.    Patients Stated Pain Goal: 8 ([id number])  Complications: No apparent anesthesia complications

## 2015-10-11 ENCOUNTER — Encounter (HOSPITAL_COMMUNITY): Payer: Self-pay | Admitting: Obstetrics & Gynecology

## 2015-10-11 DIAGNOSIS — N941 Unspecified dyspareunia: Secondary | ICD-10-CM | POA: Diagnosis not present

## 2015-10-11 DIAGNOSIS — Z818 Family history of other mental and behavioral disorders: Secondary | ICD-10-CM | POA: Diagnosis not present

## 2015-10-11 DIAGNOSIS — F329 Major depressive disorder, single episode, unspecified: Secondary | ICD-10-CM | POA: Diagnosis not present

## 2015-10-11 DIAGNOSIS — Z833 Family history of diabetes mellitus: Secondary | ICD-10-CM | POA: Diagnosis not present

## 2015-10-11 DIAGNOSIS — N920 Excessive and frequent menstruation with regular cycle: Secondary | ICD-10-CM | POA: Diagnosis not present

## 2015-10-11 DIAGNOSIS — N946 Dysmenorrhea, unspecified: Secondary | ICD-10-CM | POA: Diagnosis not present

## 2015-10-11 DIAGNOSIS — Z87891 Personal history of nicotine dependence: Secondary | ICD-10-CM | POA: Diagnosis not present

## 2015-10-11 DIAGNOSIS — Z823 Family history of stroke: Secondary | ICD-10-CM | POA: Diagnosis not present

## 2015-10-11 DIAGNOSIS — F419 Anxiety disorder, unspecified: Secondary | ICD-10-CM | POA: Diagnosis not present

## 2015-10-11 DIAGNOSIS — K219 Gastro-esophageal reflux disease without esophagitis: Secondary | ICD-10-CM | POA: Diagnosis not present

## 2015-10-11 DIAGNOSIS — Z809 Family history of malignant neoplasm, unspecified: Secondary | ICD-10-CM | POA: Diagnosis not present

## 2015-10-11 DIAGNOSIS — Z8249 Family history of ischemic heart disease and other diseases of the circulatory system: Secondary | ICD-10-CM | POA: Diagnosis not present

## 2015-10-11 DIAGNOSIS — N809 Endometriosis, unspecified: Secondary | ICD-10-CM | POA: Diagnosis not present

## 2015-10-11 LAB — BASIC METABOLIC PANEL
Anion gap: 6 (ref 5–15)
BUN: 7 mg/dL (ref 6–20)
CO2: 26 mmol/L (ref 22–32)
Calcium: 8.3 mg/dL — ABNORMAL LOW (ref 8.9–10.3)
Chloride: 109 mmol/L (ref 101–111)
Creatinine, Ser: 0.71 mg/dL (ref 0.44–1.00)
GFR calc Af Amer: >60 mL/min (ref >60)
GFR calc non Af Amer: >60 mL/min (ref >60)
Glucose, Bld: 115 mg/dL — ABNORMAL HIGH (ref 65–99)
Potassium: 4.5 mmol/L (ref 3.5–5.1)
Sodium: 141 mmol/L (ref 135–145)

## 2015-10-11 LAB — CBC
HCT: 37.2 % (ref 36.0–46.0)
Hemoglobin: 12.3 g/dL (ref 12.0–15.0)
MCH: 28.7 pg (ref 26.0–34.0)
MCHC: 33.1 g/dL (ref 30.0–36.0)
MCV: 86.7 fL (ref 78.0–100.0)
Platelets: 208 10*3/uL (ref 150–400)
RBC: 4.29 MIL/uL (ref 3.87–5.11)
RDW: 13.2 % (ref 11.5–15.5)
WBC: 9.2 10*3/uL (ref 4.0–10.5)

## 2015-10-11 MED ORDER — KETOROLAC TROMETHAMINE 10 MG PO TABS: 10.0000 mg | ORAL_TABLET | Freq: Three times a day (TID) | ORAL | Status: DC | PRN

## 2015-10-11 MED ORDER — ONDANSETRON HCL 8 MG PO TABS: 8.0000 mg | ORAL_TABLET | Freq: Four times a day (QID) | ORAL | Status: DC | PRN

## 2015-10-11 MED ORDER — OXYCODONE-ACETAMINOPHEN 5-325 MG PO TABS: 1.0000 | ORAL_TABLET | ORAL | Status: DC | PRN

## 2015-10-11 MED ORDER — BISACODYL 10 MG RE SUPP: 10.0000 mg | Freq: Every day | RECTAL | Status: DC | PRN

## 2015-10-11 NOTE — Progress Notes (Signed)
Patient with orders to be discharged home. Discharge instructions given, verbalized understanding. Prescriptions given. Patient stable. Patient left with husband in private vehicle

## 2015-10-11 NOTE — Discharge Instructions (Signed)
Abdominal Hysterectomy, Care After °Refer to this sheet in the next few weeks. These instructions provide you with information on caring for yourself after your procedure. Your health care provider may also give you more specific instructions. Your treatment has been planned according to current medical practices, but problems sometimes occur. Call your health care provider if you have any problems or questions after your procedure.  °WHAT TO EXPECT AFTER THE PROCEDURE °After your procedure, it is typical to have the following: °· Pain. °· Feeling tired. °· Poor appetite. °· Less interest in sex. °It takes 4-6 weeks to recover from this surgery.  °HOME CARE INSTRUCTIONS  °· Take pain medicines only as directed by your health care provider. Do not take over-the-counter pain medicines without checking with your health care provider first.  °· Change your bandage as directed by your health care provider. °· Return to your health care provider to have your sutures taken out. °· Take showers instead of baths for 2-3 weeks. Ask your health care provider when it is safe to start showering.  °· Do not douche, use tampons, or have sexual intercourse for at least 6 weeks or until your health care provider says you can.   °· Follow your health care provider's advice about exercise, lifting, driving, and general activities. °· Get plenty of rest and sleep.   °· Do not lift anything heavier than a gallon of milk (about 10 lb [4.5 kg]) for the first month after surgery. °· You can resume your normal diet if your health care provider says it is okay.   °· Do not drink alcohol until your health care provider says you can.   °· If you are constipated, ask your health care provider if you can take a mild laxative. °· Eating foods high in fiber may also help with constipation. Eat plenty of raw fruits and vegetables, whole grains, and beans. °· Drink enough fluids to keep your urine clear or pale yellow.   °· Try to have someone at  home with you for the first 1-2 weeks to help around the house. °· Keep all follow-up appointments. °SEEK MEDICAL CARE IF:  °· You have chills or fever. °· You have swelling, redness, or pain in the area of your incision that is getting worse.   °· You have pus coming from the incision.   °· You notice a bad smell coming from the incision or bandage.   °· Your incision breaks open.   °· You feel dizzy or light-headed.   °· You have pain or bleeding when you urinate.   °· You have persistent diarrhea.   °· You have persistent nausea and vomiting.   °· You have abnormal vaginal discharge.   °· You have a rash.   °· You have any type of abnormal reaction or develop an allergy to your medicine.   °· Your pain medicine is not helping.   °SEEK IMMEDIATE MEDICAL CARE IF:  °· You have a fever and your symptoms suddenly get worse. °· You have severe abdominal pain. °· You have chest pain. °· You have shortness of breath. °· You faint. °· You have pain, swelling, or redness of your leg. °· You have heavy vaginal bleeding with blood clots. °MAKE SURE YOU: °· Understand these instructions. °· Will watch your condition. °· Will get help right away if you are not doing well or get worse. °  °This information is not intended to replace advice given to you by your health care provider. Make sure you discuss any questions you have with your health care provider. °  °Document   Released: 12/13/2004 Document Revised: 06/16/2014 Document Reviewed: 03/18/2013 °Elsevier Interactive Patient Education ©2016 [employer] ° °

## 2015-10-11 NOTE — Addendum Note (Signed)
Addendum  created 10/11/15 0931 by Charmaine Downs, CRNA   Modules edited: Clinical Notes   Clinical Notes:  File: [id number]

## 2015-10-11 NOTE — Anesthesia Postprocedure Evaluation (Signed)
Anesthesia Post Note  Patient: Julie [name]  Procedure(s) Performed: Procedure(s) (LRB): HYSTERECTOMY ABDOMINAL (N/A) BILATERAL SALPINGECTOMY (Bilateral)  Patient location during evaluation: Nursing Unit Anesthesia Type: General Level of consciousness: awake and alert, oriented and patient cooperative Pain management: pain level controlled Vital Signs Assessment: post-procedure vital signs reviewed and stable Respiratory status: spontaneous breathing, nonlabored ventilation and respiratory function stable Cardiovascular status: blood pressure returned to baseline Postop Assessment: no signs of nausea or vomiting and adequate PO intake Anesthetic complications: no    Last Vitals:  Filed Vitals:   10/11/15 0001 10/11/15 0400  BP: 90/55 96/47  Pulse: 71 70  Temp: 36.7 C 36.4 C  Resp: 15 14    Last Pain:  Filed Vitals:   10/11/15 0809  PainSc: 8                  Barrie Wale J

## 2015-10-11 NOTE — Discharge Summary (Signed)
Physician Discharge Summary  Patient ID: Julie [name] MRN: [id number] DOB/AGE: 1976/01/04 40 y.o.  Admit date: 10/10/2015 Discharge date: 10/11/2015  Admission Diagnoses: S/p TAHBSO  Discharge Diagnoses:  Active Problems:   S/P hysterectomy   Discharged Condition: good  Hospital Course: unremarkable post operative course  Consults: None  Significant Diagnostic Studies:   Treatments: surgery: TAHBSO  Discharge Exam: Blood pressure 96/47, pulse 70, temperature 97.6 F (36.4 C), temperature source Oral, resp. rate 14, height 5\' 7"  (1.702 m), weight 275 lb 9.2 oz (125 kg), last menstrual period 09/26/2015, SpO2 98 %. General appearance: alert, cooperative and no distress GI: soft, non-tender; bowel sounds normal; no masses,  no organomegaly Incision/Wound:clean dry intact  Disposition: 01-Home or Self Care  Discharge Instructions    Call MD for:  persistant nausea and vomiting    Complete by:  As directed      Call MD for:  severe uncontrolled pain    Complete by:  As directed      Call MD for:  temperature >100.4    Complete by:  As directed      Diet - low sodium heart healthy    Complete by:  As directed      Driving Restrictions    Complete by:  As directed   No driving for 1-2 weeks     Increase activity slowly    Complete by:  As directed      Leave dressing on - Keep it clean, dry, and intact until clinic visit    Complete by:  As directed      Lifting restrictions    Complete by:  As directed   Do not lift more than 10 pounds     Sexual Activity Restrictions    Complete by:  As directed   I know you're kidding right?            Medication List    STOP taking these medications        HYDROcodone-acetaminophen 5-325 MG tablet  Commonly known as:  NORCO/VICODIN      TAKE these medications        ALPRAZolam 1 MG tablet  Commonly known as:  XANAX  Take 0.5-1 mg by mouth 3 (three) times daily as needed for anxiety.     bisacodyl 10 MG suppository   Commonly known as:  DULCOLAX  Place 1 suppository (10 mg total) rectally daily as needed for moderate constipation.     eletriptan 40 MG tablet  Commonly known as:  RELPAX  Take 40 mg by mouth as needed for migraine or headache. May repeat in 2 hours if headache persists or recurs.     ferrous sulfate 325 (65 FE) MG tablet  Take 325 mg by mouth 2 (two) times daily with a meal.     ketorolac 10 MG tablet  Commonly known as:  TORADOL  Take 1 tablet (10 mg total) by mouth every 8 (eight) hours as needed.     omeprazole 20 MG capsule  Commonly known as:  PRILOSEC  Take 20 mg by mouth daily.     ondansetron 8 MG tablet  Commonly known as:  ZOFRAN  Take 1 tablet (8 mg total) by mouth every 6 (six) hours as needed for nausea.     oxyCODONE-acetaminophen 5-325 MG tablet  Commonly known as:  PERCOCET/ROXICET  Take 1-2 tablets by mouth every 4 (four) hours as needed (moderate to severe pain (when tolerating fluids)).     propranolol 80 MG  tablet  Commonly known as:  INDERAL  Take 80 mg by mouth 2 (two) times daily.     sertraline 100 MG tablet  Commonly known as:  ZOLOFT  Take 100 mg by mouth every morning.     topiramate 25 MG capsule  Commonly known as:  TOPAMAX  Take 25 mg by mouth at bedtime.           Follow-up Information    Follow up with Florian Buff, MD On 10/22/2015.   Specialties:  Obstetrics and Gynecology, Radiology   Why:  post op visit, already scheduled   Contact information:   [place] [id number] [phone]       Signed: Florian Buff 10/11/2015, 1:07 PM

## 2015-10-15 ENCOUNTER — Other Ambulatory Visit: Payer: Self-pay | Admitting: Obstetrics & Gynecology

## 2015-10-15 ENCOUNTER — Telehealth: Payer: Self-pay | Admitting: *Deleted

## 2015-10-15 MED ORDER — FLUCONAZOLE 150 MG PO TABS: 150.0000 mg | ORAL_TABLET | Freq: Once | ORAL | Status: DC

## 2015-10-15 NOTE — Telephone Encounter (Signed)
Pt c/o white "cottage cheese" vaginal discharge. Pt states her incision site from Hysterectomy Abdominal on  10/10/2015 "looks good" no drainage, heat to touch, no swelling or inflammation.   Pt requesting a prescription for yeast infection.

## 2015-10-15 NOTE — Telephone Encounter (Signed)
Diflucan was e prescribed for pt 

## 2015-10-17 ENCOUNTER — Encounter: Payer: Self-pay | Admitting: Obstetrics and Gynecology

## 2015-10-17 ENCOUNTER — Ambulatory Visit (INDEPENDENT_AMBULATORY_CARE_PROVIDER_SITE_OTHER): Payer: BLUE CROSS/BLUE SHIELD | Admitting: Obstetrics and Gynecology

## 2015-10-17 ENCOUNTER — Telehealth: Payer: Self-pay | Admitting: Obstetrics and Gynecology

## 2015-10-17 VITALS — BP 100/70 | Ht 67.0 in | Wt 273.0 lb

## 2015-10-17 DIAGNOSIS — R208 Other disturbances of skin sensation: Secondary | ICD-10-CM | POA: Diagnosis not present

## 2015-10-17 DIAGNOSIS — R350 Frequency of micturition: Secondary | ICD-10-CM | POA: Diagnosis not present

## 2015-10-17 DIAGNOSIS — L7682 Other postprocedural complications of skin and subcutaneous tissue: Secondary | ICD-10-CM

## 2015-10-17 DIAGNOSIS — R300 Dysuria: Secondary | ICD-10-CM | POA: Diagnosis not present

## 2015-10-17 NOTE — Progress Notes (Addendum)
Patient ID: Julie [name], female   DOB: 08-01-1975, 40 y.o.   MRN: [id number]  Subjective:  Julie [name] is a 40 y.o. female now 1 weeks status post abdominal hysterectomy.  Patient WORKED [place] today for pain at the incision site, right greater than left, and nausea. She notes associated bruising at the area.  Review of Systems Negative except as noted above   Diet:   normal   Bowel movements : normal  Pain is not well controlled.  Medications being used: none   Objective:  BP 100/70 mmHg  Ht 5\' 7"  (1.702 m)  Wt 273 lb (123.832 kg)  BMI 42.75 kg/m2  LMP 09/26/2015 (Exact Date) General:Well developed, well nourished.  No acute distress. Abdomen: Bowel sounds normal, soft, non-tender.  Incision(s):   Intact, minor bruising considered wnl for post-op state, no drainage, no erythema, no hernia, no swelling, no dehiscence,     Assessment:  Post-Op 1 weeks s/p abdominal hysterectomy  working for incisional pain, considered normal  Doing well postoperatively. Normal postop recovery, minimal bruising   Plan:  1.Wound care discussed.   2. . current medications. 3. Activity restrictions: none 4. return to work: not applicable. 5. Follow up as scheduled.    By signing my name below, I, Stephania Fragmin, attest that this documentation has been prepared under the direction and in the presence of Jonnie Kind, MD. Electronically Signed: Stephania Fragmin, ED Scribe. 10/17/2015. 10:11 AM.  I personally performed the services described in this documentation, which was SCRIBED in my presence. The recorded information has been reviewed and considered accurate. It has been edited as necessary during review. Jonnie Kind, MD

## 2015-10-17 NOTE — Progress Notes (Signed)
Patient ID: Julie [name], female   DOB: [birth year], 40 y.o.   MRN: [id number] Pt worked in today for drainage and pain at the incision site, and very nauseated.

## 2015-10-17 NOTE — Telephone Encounter (Signed)
Pt called stating that she was returning Ashley's phone call. Please contact pt

## 2015-10-17 NOTE — Addendum Note (Signed)
Addended by: Farley Ly on: 10/17/2015 04:23 PM   Modules accepted: Orders

## 2015-10-17 NOTE — Telephone Encounter (Signed)
Pt states that she is having some burning with urination and she is having frequent urination. Pt states that she could get someone to drop off a urine at the office to be dipped and sent off for culture if needed. I advised the pt to have the person dropping off the urine give it to [place] due to me being out of the office until next Tuesday.

## 2015-10-18 ENCOUNTER — Telehealth: Payer: Self-pay | Admitting: *Deleted

## 2015-10-18 NOTE — Telephone Encounter (Signed)
Pt informed urine culture from 10/17/2015 still pending, can take up to 5-10 days. Pt verbalized understanding.

## 2015-10-19 LAB — URINE CULTURE

## 2015-10-22 ENCOUNTER — Ambulatory Visit (INDEPENDENT_AMBULATORY_CARE_PROVIDER_SITE_OTHER): Payer: BLUE CROSS/BLUE SHIELD | Admitting: Obstetrics & Gynecology

## 2015-10-22 ENCOUNTER — Telehealth: Payer: Self-pay | Admitting: Obstetrics and Gynecology

## 2015-10-22 ENCOUNTER — Encounter: Payer: Self-pay | Admitting: Obstetrics & Gynecology

## 2015-10-22 ENCOUNTER — Other Ambulatory Visit: Payer: Self-pay | Admitting: Obstetrics and Gynecology

## 2015-10-22 VITALS — BP 100/60 | HR 76 | Ht 67.0 in | Wt 271.0 lb

## 2015-10-22 DIAGNOSIS — Z9889 Other specified postprocedural states: Secondary | ICD-10-CM

## 2015-10-22 DIAGNOSIS — N390 Urinary tract infection, site not specified: Secondary | ICD-10-CM

## 2015-10-22 DIAGNOSIS — Z90710 Acquired absence of both cervix and uterus: Secondary | ICD-10-CM

## 2015-10-22 MED ORDER — AMPICILLIN 500 MG PO CAPS: 250.0000 mg | ORAL_CAPSULE | Freq: Four times a day (QID) | ORAL | Status: DC

## 2015-10-22 NOTE — Progress Notes (Signed)
Patient ID: Julie [name], female   DOB: August 29, 1975, 40 y.o.   MRN: [id number]  HPI: Patient returns for routine postoperative follow-up having undergone abdominal hysterectomy and removal of Fallopian tubes on 10/10/2015.  The patient's immediate postoperative recovery has been unremarkable. Since hospital discharge the patient reports had some oozing from incision which resolved Also some stinging when urinates, urine culture 10-25K GBS which is a colonized skin contaminant, would treat in pregnancy but otherwise not a uropathogen so no therapy needed. Stinging im[roving, most likely due to indwelling catheter   Current Outpatient Prescriptions: ALPRAZolam (XANAX) 1 MG tablet, Take 0.5-1 mg by mouth 3 (three) times daily as needed for anxiety., Disp: , Rfl:  eletriptan (RELPAX) 40 MG tablet, Take 40 mg by mouth as needed for migraine or headache. May repeat in 2 hours if headache persists or recurs., Disp: , Rfl:  omeprazole (PRILOSEC) 20 MG capsule, Take 20 mg by mouth daily. , Disp: , Rfl: 4 oxyCODONE-acetaminophen (PERCOCET/ROXICET) 5-325 MG tablet, Take 1-2 tablets by mouth every 4 (four) hours as needed (moderate to severe pain (when tolerating fluids))., Disp: 30 tablet, Rfl: 0 propranolol (INDERAL) 80 MG tablet, Take 80 mg by mouth 2 (two) times daily., Disp: , Rfl:  sertraline (ZOLOFT) 100 MG tablet, Take 100 mg by mouth every morning., Disp: , Rfl:  topiramate (TOPAMAX) 25 MG capsule, Take 25 mg by mouth at bedtime., Disp: , Rfl:  ampicillin (PRINCIPEN) 500 MG capsule, Take 1 capsule (500 mg total) by mouth 4 (four) times daily. (Patient not taking: Reported on 10/22/2015), Disp: 28 capsule, Rfl: 0 ferrous sulfate 325 (65 FE) MG tablet, Take 325 mg by mouth 2 (two) times daily with a meal. Reported on 10/22/2015, Disp: , Rfl:   No current facility-administered medications for this visit.    Blood pressure 100/60, pulse 76, height 5\' 7"  (1.702 m), weight 271 lb (122.925 kg), last  menstrual period 09/26/2015.  Physical Exam: Incision clean dry intact, healing well  Diagnostic Tests: none  Pathology: benign  Impression: S/p supracervical hysterectomy with removal of Fallopian tubes  Plan: Routine post op course, can drive, no sex Declines any more pain meds  Follow up: 5  weeks  Florian Buff, MD

## 2015-11-01 NOTE — Telephone Encounter (Signed)
Phone call canceled.

## 2015-11-06 DIAGNOSIS — Z111 Encounter for screening for respiratory tuberculosis: Secondary | ICD-10-CM | POA: Diagnosis not present

## 2015-11-06 DIAGNOSIS — G44219 Episodic tension-type headache, not intractable: Secondary | ICD-10-CM | POA: Diagnosis not present

## 2015-11-06 DIAGNOSIS — M546 Pain in thoracic spine: Secondary | ICD-10-CM | POA: Diagnosis not present

## 2015-11-06 DIAGNOSIS — M9901 Segmental and somatic dysfunction of cervical region: Secondary | ICD-10-CM | POA: Diagnosis not present

## 2015-11-06 DIAGNOSIS — M9902 Segmental and somatic dysfunction of thoracic region: Secondary | ICD-10-CM | POA: Diagnosis not present

## 2015-11-08 ENCOUNTER — Telehealth: Payer: Self-pay | Admitting: Obstetrics & Gynecology

## 2015-11-08 NOTE — Telephone Encounter (Signed)
Pt states had abdominal hysterectomy on 10/10/2015, c/o last 3 days feels like when she sits down there is something there, "burning inside her stomach." Pt given an appt for tomorrow with Dr.Eure for evaluation.

## 2015-11-09 ENCOUNTER — Encounter: Payer: Self-pay | Admitting: Obstetrics & Gynecology

## 2015-11-09 ENCOUNTER — Ambulatory Visit (INDEPENDENT_AMBULATORY_CARE_PROVIDER_SITE_OTHER): Payer: BLUE CROSS/BLUE SHIELD | Admitting: Obstetrics & Gynecology

## 2015-11-09 VITALS — BP 100/60 | HR 76 | Wt 274.0 lb

## 2015-11-09 DIAGNOSIS — N390 Urinary tract infection, site not specified: Secondary | ICD-10-CM

## 2015-11-09 LAB — POCT URINALYSIS DIPSTICK
Blood, UA: NEGATIVE
Glucose, UA: NEGATIVE
Ketones, UA: NEGATIVE
Leukocytes, UA: NEGATIVE
Nitrite, UA: NEGATIVE

## 2015-11-09 MED ORDER — CIPROFLOXACIN HCL 500 MG PO TABS: 500.0000 mg | ORAL_TABLET | Freq: Two times a day (BID) | ORAL | Status: DC

## 2015-11-09 NOTE — Progress Notes (Signed)
Patient ID: Julie [name], female   DOB: 1975/08/02, 40 y.o.   MRN: [id number]      Chief Complaint  Patient presents with  . work-in-post-op    when sit down feel like something there,pushing up/ feeling pressure and burning lower part of stomach.    Blood pressure 100/60, pulse 76, weight 274 lb (124.286 kg), last menstrual period 09/26/2015.  40 y.o. G2P0111 Patient's last menstrual period was 09/26/2015 (exact date). The current method of family planning is status post hysterectomy.  Subjective Pt is 1 month post op hysterectomy No significant post op problems 4 days ago began having frequency and urgency and an associated sensation of feeling like "something was being pushed up when I sit down" BM are normal for her no constipation No bleeding Denies sexual relations  Objective Incision clean dry intact Abdomen soft normal non tender NEFG Vagina without blood or discharge Cuff intact no masses non tender normal post op Bimanual no midline or adnexal masses, some tenderness on exam but normal post op  Pertinent ROS Per HPI  Labs or studies Urine culture    Impression Diagnoses this Encounter::   ICD-9-CM ICD-10-CM   1. UTI (lower urinary tract infection) 599.0 N39.0     Established relevant diagnosis(es):   Plan/Recommendations: Meds ordered this encounter  Medications  . ciprofloxacin (CIPRO) 500 MG tablet    Sig: Take 1 tablet (500 mg total) by mouth 2 (two) times daily.    Dispense:  14 tablet    Refill:  0    Labs or Scans Ordered: No orders of the defined types were placed in this encounter.    Management:: Treat as if UTI symptoms are related  Follow up Return in about 1 week (around 11/16/2015) for Follow up, with Dr Elonda Husky.         All questions were answered.

## 2015-11-09 NOTE — Addendum Note (Signed)
Addended by: Diona Fanti A on: 11/09/2015 12:27 PM   Modules accepted: Orders

## 2015-11-11 LAB — PLEASE NOTE

## 2015-11-11 LAB — URINE CULTURE: Organism ID, Bacteria: NO GROWTH

## 2015-11-13 ENCOUNTER — Telehealth: Payer: Self-pay | Admitting: Obstetrics & Gynecology

## 2015-11-13 MED ORDER — FLUCONAZOLE 150 MG PO TABS: 150.0000 mg | ORAL_TABLET | Freq: Once | ORAL | Status: DC

## 2015-11-14 DIAGNOSIS — M546 Pain in thoracic spine: Secondary | ICD-10-CM | POA: Diagnosis not present

## 2015-11-14 DIAGNOSIS — M9901 Segmental and somatic dysfunction of cervical region: Secondary | ICD-10-CM | POA: Diagnosis not present

## 2015-11-14 DIAGNOSIS — G44219 Episodic tension-type headache, not intractable: Secondary | ICD-10-CM | POA: Diagnosis not present

## 2015-11-14 DIAGNOSIS — M9902 Segmental and somatic dysfunction of thoracic region: Secondary | ICD-10-CM | POA: Diagnosis not present

## 2015-11-16 ENCOUNTER — Encounter: Payer: Self-pay | Admitting: Obstetrics & Gynecology

## 2015-11-16 ENCOUNTER — Ambulatory Visit (INDEPENDENT_AMBULATORY_CARE_PROVIDER_SITE_OTHER): Payer: BLUE CROSS/BLUE SHIELD | Admitting: Obstetrics & Gynecology

## 2015-11-16 VITALS — BP 100/70 | HR 72 | Wt 276.0 lb

## 2015-11-16 DIAGNOSIS — Z9889 Other specified postprocedural states: Secondary | ICD-10-CM

## 2015-11-16 DIAGNOSIS — Z90710 Acquired absence of both cervix and uterus: Secondary | ICD-10-CM

## 2015-11-16 NOTE — Progress Notes (Signed)
Patient ID: Julie [name], female   DOB: Feb 17, 1976, 40 y.o.   MRN: [id number] Patient ID: Julie [name], female   DOB: 1975/12/14, 40 y.o.   MRN: [id number]    See last note:  All symptoms completely resolved Did have symptoms of yeast and prescribed diflucan which has also resolved  Final post op scheduled for 11/26/2015  Incision clean dry intact        Chief Complaint  Patient presents with  . Routine Post Op    Blood pressure 100/70, pulse 72, weight 276 lb (125.2 kg), last menstrual period 09/26/2015.  40 y.o. G2P0111 Patient's last menstrual period was 09/26/2015 (exact date). The current method of family planning is status post hysterectomy.  Subjective Pt is 1 month post op hysterectomy No significant post op problems 4 days ago began having frequency and urgency and an associated sensation of feeling like "something was being pushed up when I sit down" BM are normal for her no constipation No bleeding Denies sexual relations  Objective Incision clean dry intact Abdomen soft normal non tender NEFG Vagina without blood or discharge Cuff intact no masses non tender normal post op Bimanual no midline or adnexal masses, some tenderness on exam but normal post op  Pertinent ROS Per HPI  Labs or studies Urine culture    Impression Diagnoses this Encounter::   ICD-9-CM ICD-10-CM   1. Postoperative state V45.89 Z98.890   2. S/P hysterectomy V88.01 Z90.710     Established relevant diagnosis(es):   Plan/Recommendations: No orders of the defined types were placed in this encounter.   Labs or Scans Ordered: No orders of the defined types were placed in this encounter.   Management:: Treat as if UTI symptoms are related  Follow up Return if symptoms worsen or fail to improve, for keep scheduled.         All questions were answered.

## 2015-11-19 ENCOUNTER — Telehealth: Payer: Self-pay | Admitting: *Deleted

## 2015-11-19 NOTE — Telephone Encounter (Signed)
No real need to see urologist at this point  Keep scheduled follow up  Drink fluids and take pain meds if needed

## 2015-11-19 NOTE — Telephone Encounter (Signed)
Pt c/o back pain, blood with urination. Pt states she has had a hx of kidney stones and that is what the pain feels like, urine culture neg from 11/09/2015.   Pt states does she need to see her Urologist or does she need to follow up with Dr.Eure since she is still in her post op stage?

## 2015-11-19 NOTE — Telephone Encounter (Signed)
Pt informed of Dr. Elonda Husky recommendations and verbalized understanding.

## 2015-11-26 ENCOUNTER — Encounter: Payer: Self-pay | Admitting: Obstetrics & Gynecology

## 2015-11-26 ENCOUNTER — Ambulatory Visit (INDEPENDENT_AMBULATORY_CARE_PROVIDER_SITE_OTHER): Payer: BLUE CROSS/BLUE SHIELD | Admitting: Obstetrics & Gynecology

## 2015-11-26 VITALS — BP 100/60 | HR 72 | Wt 275.0 lb

## 2015-11-26 DIAGNOSIS — Z90710 Acquired absence of both cervix and uterus: Secondary | ICD-10-CM

## 2015-11-26 DIAGNOSIS — Z09 Encounter for follow-up examination after completed treatment for conditions other than malignant neoplasm: Secondary | ICD-10-CM

## 2015-11-26 NOTE — Progress Notes (Signed)
Patient ID: Julie [name], female   DOB: 05/16/1976, 40 y.o.   MRN: [id number]  HPI: Patient returns for routine postoperative follow-up having undergone 10/10/2015 on TAH and removal of tubes.  The patient's immediate postoperative recovery has been unremarkable. Since hospital discharge the patient reports normal post op course.   Current Outpatient Prescriptions: ALPRAZolam (XANAX) 1 MG tablet, Take 0.5-1 mg by mouth 3 (three) times daily as needed for anxiety., Disp: , Rfl:  eletriptan (RELPAX) 40 MG tablet, Take 40 mg by mouth as needed for migraine or headache. May repeat in 2 hours if headache persists or recurs., Disp: , Rfl:  omeprazole (PRILOSEC) 20 MG capsule, Take 20 mg by mouth daily. , Disp: , Rfl: 4 sertraline (ZOLOFT) 100 MG tablet, Take 100 mg by mouth every morning., Disp: , Rfl:  topiramate (TOPAMAX) 25 MG capsule, Take 25 mg by mouth at bedtime., Disp: , Rfl:   No current facility-administered medications for this visit.    Blood pressure 100/60, pulse 72, weight 275 lb (124.739 kg), last menstrual period 09/26/2015.  Physical Exam: Incision clean dry intact Vagina still suture in place needs about 2 weeks more before intercourse  Diagnostic Tests:   Pathology: benign  Impression: S/P abdominal hysterectomy with removal of cervix and tubes, ovaries left in place  Plan: No sex for 2 weeks Follow up 1 year  Follow up: 1  years  Florian Buff, MD

## 2015-11-30 DIAGNOSIS — M546 Pain in thoracic spine: Secondary | ICD-10-CM | POA: Diagnosis not present

## 2015-11-30 DIAGNOSIS — G44219 Episodic tension-type headache, not intractable: Secondary | ICD-10-CM | POA: Diagnosis not present

## 2015-11-30 DIAGNOSIS — M9901 Segmental and somatic dysfunction of cervical region: Secondary | ICD-10-CM | POA: Diagnosis not present

## 2015-11-30 DIAGNOSIS — M9902 Segmental and somatic dysfunction of thoracic region: Secondary | ICD-10-CM | POA: Diagnosis not present

## 2015-12-12 DIAGNOSIS — M9902 Segmental and somatic dysfunction of thoracic region: Secondary | ICD-10-CM | POA: Diagnosis not present

## 2015-12-12 DIAGNOSIS — M546 Pain in thoracic spine: Secondary | ICD-10-CM | POA: Diagnosis not present

## 2015-12-12 DIAGNOSIS — M9901 Segmental and somatic dysfunction of cervical region: Secondary | ICD-10-CM | POA: Diagnosis not present

## 2015-12-12 DIAGNOSIS — G44219 Episodic tension-type headache, not intractable: Secondary | ICD-10-CM | POA: Diagnosis not present

## 2015-12-14 DIAGNOSIS — M9902 Segmental and somatic dysfunction of thoracic region: Secondary | ICD-10-CM | POA: Diagnosis not present

## 2015-12-14 DIAGNOSIS — M546 Pain in thoracic spine: Secondary | ICD-10-CM | POA: Diagnosis not present

## 2015-12-14 DIAGNOSIS — M9901 Segmental and somatic dysfunction of cervical region: Secondary | ICD-10-CM | POA: Diagnosis not present

## 2015-12-14 DIAGNOSIS — G44219 Episodic tension-type headache, not intractable: Secondary | ICD-10-CM | POA: Diagnosis not present

## 2015-12-18 DIAGNOSIS — M9902 Segmental and somatic dysfunction of thoracic region: Secondary | ICD-10-CM | POA: Diagnosis not present

## 2015-12-18 DIAGNOSIS — M546 Pain in thoracic spine: Secondary | ICD-10-CM | POA: Diagnosis not present

## 2015-12-18 DIAGNOSIS — G44219 Episodic tension-type headache, not intractable: Secondary | ICD-10-CM | POA: Diagnosis not present

## 2015-12-18 DIAGNOSIS — M9901 Segmental and somatic dysfunction of cervical region: Secondary | ICD-10-CM | POA: Diagnosis not present

## 2016-01-02 DIAGNOSIS — G44219 Episodic tension-type headache, not intractable: Secondary | ICD-10-CM | POA: Diagnosis not present

## 2016-01-02 DIAGNOSIS — M546 Pain in thoracic spine: Secondary | ICD-10-CM | POA: Diagnosis not present

## 2016-01-02 DIAGNOSIS — M9902 Segmental and somatic dysfunction of thoracic region: Secondary | ICD-10-CM | POA: Diagnosis not present

## 2016-01-02 DIAGNOSIS — M9901 Segmental and somatic dysfunction of cervical region: Secondary | ICD-10-CM | POA: Diagnosis not present

## 2016-01-07 DIAGNOSIS — Z6841 Body Mass Index (BMI) 40.0 and over, adult: Secondary | ICD-10-CM | POA: Diagnosis not present

## 2016-01-07 DIAGNOSIS — G44219 Episodic tension-type headache, not intractable: Secondary | ICD-10-CM | POA: Diagnosis not present

## 2016-01-07 DIAGNOSIS — Z1389 Encounter for screening for other disorder: Secondary | ICD-10-CM | POA: Diagnosis not present

## 2016-01-07 DIAGNOSIS — M546 Pain in thoracic spine: Secondary | ICD-10-CM | POA: Diagnosis not present

## 2016-01-07 DIAGNOSIS — M9901 Segmental and somatic dysfunction of cervical region: Secondary | ICD-10-CM | POA: Diagnosis not present

## 2016-01-07 DIAGNOSIS — G43109 Migraine with aura, not intractable, without status migrainosus: Secondary | ICD-10-CM | POA: Diagnosis not present

## 2016-01-07 DIAGNOSIS — M9902 Segmental and somatic dysfunction of thoracic region: Secondary | ICD-10-CM | POA: Diagnosis not present

## 2016-01-08 DIAGNOSIS — M9902 Segmental and somatic dysfunction of thoracic region: Secondary | ICD-10-CM | POA: Diagnosis not present

## 2016-01-08 DIAGNOSIS — M546 Pain in thoracic spine: Secondary | ICD-10-CM | POA: Diagnosis not present

## 2016-01-08 DIAGNOSIS — G44219 Episodic tension-type headache, not intractable: Secondary | ICD-10-CM | POA: Diagnosis not present

## 2016-01-08 DIAGNOSIS — M9901 Segmental and somatic dysfunction of cervical region: Secondary | ICD-10-CM | POA: Diagnosis not present

## 2016-01-14 DIAGNOSIS — M9901 Segmental and somatic dysfunction of cervical region: Secondary | ICD-10-CM | POA: Diagnosis not present

## 2016-01-14 DIAGNOSIS — M546 Pain in thoracic spine: Secondary | ICD-10-CM | POA: Diagnosis not present

## 2016-01-14 DIAGNOSIS — M9902 Segmental and somatic dysfunction of thoracic region: Secondary | ICD-10-CM | POA: Diagnosis not present

## 2016-01-14 DIAGNOSIS — G44219 Episodic tension-type headache, not intractable: Secondary | ICD-10-CM | POA: Diagnosis not present

## 2016-01-23 DIAGNOSIS — G44219 Episodic tension-type headache, not intractable: Secondary | ICD-10-CM | POA: Diagnosis not present

## 2016-01-23 DIAGNOSIS — M546 Pain in thoracic spine: Secondary | ICD-10-CM | POA: Diagnosis not present

## 2016-01-23 DIAGNOSIS — M9901 Segmental and somatic dysfunction of cervical region: Secondary | ICD-10-CM | POA: Diagnosis not present

## 2016-01-23 DIAGNOSIS — M9902 Segmental and somatic dysfunction of thoracic region: Secondary | ICD-10-CM | POA: Diagnosis not present

## 2016-01-28 DIAGNOSIS — Z6841 Body Mass Index (BMI) 40.0 and over, adult: Secondary | ICD-10-CM | POA: Diagnosis not present

## 2016-01-28 DIAGNOSIS — G43109 Migraine with aura, not intractable, without status migrainosus: Secondary | ICD-10-CM | POA: Diagnosis not present

## 2016-01-28 DIAGNOSIS — L659 Nonscarring hair loss, unspecified: Secondary | ICD-10-CM | POA: Diagnosis not present

## 2016-01-28 DIAGNOSIS — M9901 Segmental and somatic dysfunction of cervical region: Secondary | ICD-10-CM | POA: Diagnosis not present

## 2016-01-28 DIAGNOSIS — G44219 Episodic tension-type headache, not intractable: Secondary | ICD-10-CM | POA: Diagnosis not present

## 2016-01-28 DIAGNOSIS — Z1389 Encounter for screening for other disorder: Secondary | ICD-10-CM | POA: Diagnosis not present

## 2016-01-28 DIAGNOSIS — R7309 Other abnormal glucose: Secondary | ICD-10-CM | POA: Diagnosis not present

## 2016-01-28 DIAGNOSIS — M9902 Segmental and somatic dysfunction of thoracic region: Secondary | ICD-10-CM | POA: Diagnosis not present

## 2016-01-28 DIAGNOSIS — M546 Pain in thoracic spine: Secondary | ICD-10-CM | POA: Diagnosis not present

## 2016-02-04 DIAGNOSIS — M9902 Segmental and somatic dysfunction of thoracic region: Secondary | ICD-10-CM | POA: Diagnosis not present

## 2016-02-04 DIAGNOSIS — M9901 Segmental and somatic dysfunction of cervical region: Secondary | ICD-10-CM | POA: Diagnosis not present

## 2016-02-04 DIAGNOSIS — G44219 Episodic tension-type headache, not intractable: Secondary | ICD-10-CM | POA: Diagnosis not present

## 2016-02-28 DIAGNOSIS — M9901 Segmental and somatic dysfunction of cervical region: Secondary | ICD-10-CM | POA: Diagnosis not present

## 2016-02-28 DIAGNOSIS — G44219 Episodic tension-type headache, not intractable: Secondary | ICD-10-CM | POA: Diagnosis not present

## 2016-02-28 DIAGNOSIS — M9902 Segmental and somatic dysfunction of thoracic region: Secondary | ICD-10-CM | POA: Diagnosis not present

## 2016-02-28 DIAGNOSIS — M546 Pain in thoracic spine: Secondary | ICD-10-CM | POA: Diagnosis not present

## 2016-03-10 DIAGNOSIS — M9901 Segmental and somatic dysfunction of cervical region: Secondary | ICD-10-CM | POA: Diagnosis not present

## 2016-03-10 DIAGNOSIS — G44219 Episodic tension-type headache, not intractable: Secondary | ICD-10-CM | POA: Diagnosis not present

## 2016-03-10 DIAGNOSIS — M546 Pain in thoracic spine: Secondary | ICD-10-CM | POA: Diagnosis not present

## 2016-03-10 DIAGNOSIS — M9902 Segmental and somatic dysfunction of thoracic region: Secondary | ICD-10-CM | POA: Diagnosis not present

## 2016-03-12 DIAGNOSIS — M9901 Segmental and somatic dysfunction of cervical region: Secondary | ICD-10-CM | POA: Diagnosis not present

## 2016-03-12 DIAGNOSIS — G44219 Episodic tension-type headache, not intractable: Secondary | ICD-10-CM | POA: Diagnosis not present

## 2016-03-12 DIAGNOSIS — M9902 Segmental and somatic dysfunction of thoracic region: Secondary | ICD-10-CM | POA: Diagnosis not present

## 2016-03-12 DIAGNOSIS — M546 Pain in thoracic spine: Secondary | ICD-10-CM | POA: Diagnosis not present

## 2016-03-19 DIAGNOSIS — Z23 Encounter for immunization: Secondary | ICD-10-CM | POA: Diagnosis not present

## 2016-03-24 DIAGNOSIS — M9901 Segmental and somatic dysfunction of cervical region: Secondary | ICD-10-CM | POA: Diagnosis not present

## 2016-03-24 DIAGNOSIS — G44219 Episodic tension-type headache, not intractable: Secondary | ICD-10-CM | POA: Diagnosis not present

## 2016-03-24 DIAGNOSIS — M9902 Segmental and somatic dysfunction of thoracic region: Secondary | ICD-10-CM | POA: Diagnosis not present

## 2016-03-24 DIAGNOSIS — M546 Pain in thoracic spine: Secondary | ICD-10-CM | POA: Diagnosis not present

## 2016-04-08 DIAGNOSIS — L301 Dyshidrosis [pompholyx]: Secondary | ICD-10-CM | POA: Diagnosis not present

## 2016-04-08 DIAGNOSIS — L918 Other hypertrophic disorders of the skin: Secondary | ICD-10-CM | POA: Diagnosis not present

## 2016-04-08 DIAGNOSIS — D225 Melanocytic nevi of trunk: Secondary | ICD-10-CM | POA: Diagnosis not present

## 2016-04-11 DIAGNOSIS — M9901 Segmental and somatic dysfunction of cervical region: Secondary | ICD-10-CM | POA: Diagnosis not present

## 2016-04-11 DIAGNOSIS — M9902 Segmental and somatic dysfunction of thoracic region: Secondary | ICD-10-CM | POA: Diagnosis not present

## 2016-04-11 DIAGNOSIS — G44219 Episodic tension-type headache, not intractable: Secondary | ICD-10-CM | POA: Diagnosis not present

## 2016-04-11 DIAGNOSIS — M9903 Segmental and somatic dysfunction of lumbar region: Secondary | ICD-10-CM | POA: Diagnosis not present

## 2016-04-21 DIAGNOSIS — M9903 Segmental and somatic dysfunction of lumbar region: Secondary | ICD-10-CM | POA: Diagnosis not present

## 2016-04-21 DIAGNOSIS — M9901 Segmental and somatic dysfunction of cervical region: Secondary | ICD-10-CM | POA: Diagnosis not present

## 2016-04-21 DIAGNOSIS — M9902 Segmental and somatic dysfunction of thoracic region: Secondary | ICD-10-CM | POA: Diagnosis not present

## 2016-04-21 DIAGNOSIS — G44219 Episodic tension-type headache, not intractable: Secondary | ICD-10-CM | POA: Diagnosis not present

## 2016-04-25 DIAGNOSIS — M9901 Segmental and somatic dysfunction of cervical region: Secondary | ICD-10-CM | POA: Diagnosis not present

## 2016-04-25 DIAGNOSIS — G44219 Episodic tension-type headache, not intractable: Secondary | ICD-10-CM | POA: Diagnosis not present

## 2016-04-25 DIAGNOSIS — M9902 Segmental and somatic dysfunction of thoracic region: Secondary | ICD-10-CM | POA: Diagnosis not present

## 2016-04-25 DIAGNOSIS — M9903 Segmental and somatic dysfunction of lumbar region: Secondary | ICD-10-CM | POA: Diagnosis not present

## 2016-05-05 DIAGNOSIS — J329 Chronic sinusitis, unspecified: Secondary | ICD-10-CM | POA: Diagnosis not present

## 2016-05-05 DIAGNOSIS — J111 Influenza due to unidentified influenza virus with other respiratory manifestations: Secondary | ICD-10-CM | POA: Diagnosis not present

## 2016-05-05 DIAGNOSIS — J040 Acute laryngitis: Secondary | ICD-10-CM | POA: Diagnosis not present

## 2016-05-07 DIAGNOSIS — M9903 Segmental and somatic dysfunction of lumbar region: Secondary | ICD-10-CM | POA: Diagnosis not present

## 2016-05-07 DIAGNOSIS — G44219 Episodic tension-type headache, not intractable: Secondary | ICD-10-CM | POA: Diagnosis not present

## 2016-05-07 DIAGNOSIS — M9902 Segmental and somatic dysfunction of thoracic region: Secondary | ICD-10-CM | POA: Diagnosis not present

## 2016-05-07 DIAGNOSIS — M9901 Segmental and somatic dysfunction of cervical region: Secondary | ICD-10-CM | POA: Diagnosis not present

## 2016-05-13 DIAGNOSIS — J209 Acute bronchitis, unspecified: Secondary | ICD-10-CM | POA: Diagnosis not present

## 2016-05-13 DIAGNOSIS — J069 Acute upper respiratory infection, unspecified: Secondary | ICD-10-CM | POA: Diagnosis not present

## 2016-05-13 DIAGNOSIS — Z6841 Body Mass Index (BMI) 40.0 and over, adult: Secondary | ICD-10-CM | POA: Diagnosis not present

## 2016-05-16 DIAGNOSIS — M9903 Segmental and somatic dysfunction of lumbar region: Secondary | ICD-10-CM | POA: Diagnosis not present

## 2016-05-16 DIAGNOSIS — M9902 Segmental and somatic dysfunction of thoracic region: Secondary | ICD-10-CM | POA: Diagnosis not present

## 2016-05-16 DIAGNOSIS — M9901 Segmental and somatic dysfunction of cervical region: Secondary | ICD-10-CM | POA: Diagnosis not present

## 2016-05-16 DIAGNOSIS — G44219 Episodic tension-type headache, not intractable: Secondary | ICD-10-CM | POA: Diagnosis not present

## 2016-05-20 DIAGNOSIS — M9903 Segmental and somatic dysfunction of lumbar region: Secondary | ICD-10-CM | POA: Diagnosis not present

## 2016-05-20 DIAGNOSIS — G44219 Episodic tension-type headache, not intractable: Secondary | ICD-10-CM | POA: Diagnosis not present

## 2016-05-20 DIAGNOSIS — M9901 Segmental and somatic dysfunction of cervical region: Secondary | ICD-10-CM | POA: Diagnosis not present

## 2016-05-20 DIAGNOSIS — M9902 Segmental and somatic dysfunction of thoracic region: Secondary | ICD-10-CM | POA: Diagnosis not present

## 2016-05-30 DIAGNOSIS — G44219 Episodic tension-type headache, not intractable: Secondary | ICD-10-CM | POA: Diagnosis not present

## 2016-05-30 DIAGNOSIS — M9903 Segmental and somatic dysfunction of lumbar region: Secondary | ICD-10-CM | POA: Diagnosis not present

## 2016-05-30 DIAGNOSIS — M9901 Segmental and somatic dysfunction of cervical region: Secondary | ICD-10-CM | POA: Diagnosis not present

## 2016-05-30 DIAGNOSIS — M9902 Segmental and somatic dysfunction of thoracic region: Secondary | ICD-10-CM | POA: Diagnosis not present

## 2016-06-05 DIAGNOSIS — M9902 Segmental and somatic dysfunction of thoracic region: Secondary | ICD-10-CM | POA: Diagnosis not present

## 2016-06-05 DIAGNOSIS — M9901 Segmental and somatic dysfunction of cervical region: Secondary | ICD-10-CM | POA: Diagnosis not present

## 2016-06-05 DIAGNOSIS — G44219 Episodic tension-type headache, not intractable: Secondary | ICD-10-CM | POA: Diagnosis not present

## 2016-06-05 DIAGNOSIS — M9903 Segmental and somatic dysfunction of lumbar region: Secondary | ICD-10-CM | POA: Diagnosis not present

## 2016-06-10 DIAGNOSIS — J029 Acute pharyngitis, unspecified: Secondary | ICD-10-CM | POA: Diagnosis not present

## 2016-06-10 DIAGNOSIS — Z6841 Body Mass Index (BMI) 40.0 and over, adult: Secondary | ICD-10-CM | POA: Diagnosis not present

## 2016-06-10 DIAGNOSIS — Z1389 Encounter for screening for other disorder: Secondary | ICD-10-CM | POA: Diagnosis not present

## 2016-06-16 DIAGNOSIS — G44219 Episodic tension-type headache, not intractable: Secondary | ICD-10-CM | POA: Diagnosis not present

## 2016-06-16 DIAGNOSIS — M9902 Segmental and somatic dysfunction of thoracic region: Secondary | ICD-10-CM | POA: Diagnosis not present

## 2016-06-16 DIAGNOSIS — M9901 Segmental and somatic dysfunction of cervical region: Secondary | ICD-10-CM | POA: Diagnosis not present

## 2016-06-16 DIAGNOSIS — M9903 Segmental and somatic dysfunction of lumbar region: Secondary | ICD-10-CM | POA: Diagnosis not present

## 2016-07-01 DIAGNOSIS — M9902 Segmental and somatic dysfunction of thoracic region: Secondary | ICD-10-CM | POA: Diagnosis not present

## 2016-07-01 DIAGNOSIS — M9903 Segmental and somatic dysfunction of lumbar region: Secondary | ICD-10-CM | POA: Diagnosis not present

## 2016-07-01 DIAGNOSIS — M9901 Segmental and somatic dysfunction of cervical region: Secondary | ICD-10-CM | POA: Diagnosis not present

## 2016-07-01 DIAGNOSIS — G44219 Episodic tension-type headache, not intractable: Secondary | ICD-10-CM | POA: Diagnosis not present

## 2016-07-21 DIAGNOSIS — Z6841 Body Mass Index (BMI) 40.0 and over, adult: Secondary | ICD-10-CM | POA: Diagnosis not present

## 2016-07-21 DIAGNOSIS — G44219 Episodic tension-type headache, not intractable: Secondary | ICD-10-CM | POA: Diagnosis not present

## 2016-07-21 DIAGNOSIS — M9901 Segmental and somatic dysfunction of cervical region: Secondary | ICD-10-CM | POA: Diagnosis not present

## 2016-07-21 DIAGNOSIS — J0100 Acute maxillary sinusitis, unspecified: Secondary | ICD-10-CM | POA: Diagnosis not present

## 2016-07-21 DIAGNOSIS — M9902 Segmental and somatic dysfunction of thoracic region: Secondary | ICD-10-CM | POA: Diagnosis not present

## 2016-07-21 DIAGNOSIS — Z1389 Encounter for screening for other disorder: Secondary | ICD-10-CM | POA: Diagnosis not present

## 2016-07-21 DIAGNOSIS — M9903 Segmental and somatic dysfunction of lumbar region: Secondary | ICD-10-CM | POA: Diagnosis not present

## 2016-08-06 DIAGNOSIS — M9901 Segmental and somatic dysfunction of cervical region: Secondary | ICD-10-CM | POA: Diagnosis not present

## 2016-08-06 DIAGNOSIS — M9902 Segmental and somatic dysfunction of thoracic region: Secondary | ICD-10-CM | POA: Diagnosis not present

## 2016-08-06 DIAGNOSIS — G44219 Episodic tension-type headache, not intractable: Secondary | ICD-10-CM | POA: Diagnosis not present

## 2016-08-06 DIAGNOSIS — M9903 Segmental and somatic dysfunction of lumbar region: Secondary | ICD-10-CM | POA: Diagnosis not present

## 2016-08-12 DIAGNOSIS — G44219 Episodic tension-type headache, not intractable: Secondary | ICD-10-CM | POA: Diagnosis not present

## 2016-08-12 DIAGNOSIS — M9902 Segmental and somatic dysfunction of thoracic region: Secondary | ICD-10-CM | POA: Diagnosis not present

## 2016-08-12 DIAGNOSIS — M9903 Segmental and somatic dysfunction of lumbar region: Secondary | ICD-10-CM | POA: Diagnosis not present

## 2016-08-12 DIAGNOSIS — M9901 Segmental and somatic dysfunction of cervical region: Secondary | ICD-10-CM | POA: Diagnosis not present

## 2016-08-18 ENCOUNTER — Ambulatory Visit (INDEPENDENT_AMBULATORY_CARE_PROVIDER_SITE_OTHER): Payer: BLUE CROSS/BLUE SHIELD | Admitting: Otolaryngology

## 2016-08-21 DIAGNOSIS — R5383 Other fatigue: Secondary | ICD-10-CM | POA: Diagnosis not present

## 2016-08-21 DIAGNOSIS — Z1389 Encounter for screening for other disorder: Secondary | ICD-10-CM | POA: Diagnosis not present

## 2016-08-21 DIAGNOSIS — K219 Gastro-esophageal reflux disease without esophagitis: Secondary | ICD-10-CM | POA: Diagnosis not present

## 2016-08-21 DIAGNOSIS — R079 Chest pain, unspecified: Secondary | ICD-10-CM | POA: Diagnosis not present

## 2016-08-21 DIAGNOSIS — Z6841 Body Mass Index (BMI) 40.0 and over, adult: Secondary | ICD-10-CM | POA: Diagnosis not present

## 2016-08-27 ENCOUNTER — Ambulatory Visit (INDEPENDENT_AMBULATORY_CARE_PROVIDER_SITE_OTHER): Payer: BLUE CROSS/BLUE SHIELD | Admitting: Allergy & Immunology

## 2016-08-27 ENCOUNTER — Encounter: Payer: Self-pay | Admitting: Allergy & Immunology

## 2016-08-27 VITALS — BP 108/68 | HR 103 | Temp 97.1°F | Resp 18 | Ht 66.5 in | Wt 298.4 lb

## 2016-08-27 DIAGNOSIS — R51 Headache: Secondary | ICD-10-CM

## 2016-08-27 DIAGNOSIS — R519 Headache, unspecified: Secondary | ICD-10-CM

## 2016-08-27 DIAGNOSIS — J3089 Other allergic rhinitis: Secondary | ICD-10-CM

## 2016-08-27 DIAGNOSIS — G8929 Other chronic pain: Secondary | ICD-10-CM

## 2016-08-27 MED ORDER — LEVOCETIRIZINE DIHYDROCHLORIDE 5 MG PO TABS: 5.0000 mg | ORAL_TABLET | Freq: Every evening | ORAL | 5 refills | Status: DC

## 2016-08-27 MED ORDER — FLUTICASONE PROPIONATE 50 MCG/ACT NA SUSP: 1.0000 | Freq: Two times a day (BID) | NASAL | 5 refills | Status: DC

## 2016-08-27 NOTE — Progress Notes (Signed)
NEW PATIENT  Date of Service/Encounter:  08/27/16  Referring provider: Purvis Kilts, MD   Assessment:    Chronic nonseasonal allergic rhinitis (grass, ragweed, trees, molds, cat, cockroach, and a mild reaction to dust mite)   Chronic intractable headache    Plan/Recommendations:   1. Chronic rhinitis - Testing today showed: allergies to grass, ragweed, trees, molds, cat, cockroach, and a mild reaction to dust mite - Avoidance measures provided. - Start nasal saline rinses with NeilMed bottles twice daily. - Start fluticasone one spray per nostril twice daily. - Start Xyzal 5mg  daily. - If the above is not working, I would recommend starting allergy shots. - Information on allergy shots provided.   2. Chronic intractable headache - If the allergy medications do not improve your symptoms, we will try to get approval for a sinus CT. - Call us in two weeks with an update. - Of note, Ms. Celia is seeing Dr. Benjamine Mola next week. - Therefore she might get a sinus CT anyway.  - I will send a copy of this note to Dr. Benjamine Mola to keep him in the loop.  - The history of recent dysphagia makes reflux a consideration, although I am unsure how to connect this to headaches. - Hopefully Dr. Benjamine Mola will do a nasal endoscopy to see if there is any evidence of reflux.   3. Return in about 3 months (around 11/27/2016).   Subjective:   Julie [name] is a 41 y.o. female presenting today for evaluation of  Chief Complaint  Patient presents with  . Allergies  . Nasal Congestion    Stays dry but runny- post nasal drip- off and on 3 month  . Cough    off and on for 3 months    Julie [name] has a history of the following: Patient Active Problem List   Diagnosis Date Noted  . S/P hysterectomy 10/10/2015  . Pelvic pain in female 09/19/2015  . Menorrhagia with regular cycle 09/19/2015  . Dyspareunia in female 09/19/2015  . Dysmenorrhea 09/19/2015  . Left breast mass 04/12/2014  .  Breast discharge 07/01/2013  . Vaginal discharge 07/01/2013  . Hernia of abdominal wall 01/04/2013  . Breast mass, right 10/20/2012  . Ectopic pregnancy     History obtained from: chart review and patient.  [name] was referred by Purvis Kilts, MD.     Sherryll is a 41 y.o. female with a history of chronic headaches presenting for evaluation of allergies. She has a history of three months of chronic sinusitis and she is concerned that allergies might be contributing to her symptoms. She was first diagnosed in December and has been diagnosed with sinusitis on four occasions. She was first treated with ciprofloxacin, moxifloxacin, and Augmentin. She did not get a sinus CT. Prior to this, she had a long history of headaches. She was treated with four different medications without improvement. She then had a certain point of her ears pierced which did help with the headaches but did not relieve them. She continues to have frontal headaches. She had tried acupuncture as well as injections in her head without improvement in her symptoms. She has been followed at the [facility] here in [place].   She has not tried any allergy medications. She has used an enema in the past and has a sensitive gag reflex and vomits. She has tried no antihistamines or montelukast. She may have tried a nasal steroid in the past, but again she tends to vomit when  the liquid hits the back of her throat. She does not notice any worsening of her symptoms during a particular time of the year. She does have quite a few animals at home. She has never seen ENT and has never had a sinus CT. She is scheduled to see Dr. Benjamine Mola next Monday however.   She does not have a history of asthma. She does endorse some difficulty breathing for the past 3 months, but this seems to be more of an upper airway issue rather than a lower airway issue. She does have a 6 month history of dysphagia, although she describes it more as a glob  of mucus in her throat rather than actual difficulty swallowing. She is able to swallow pills and eat food without using large amounts of water afterwards. She denies a history of food allergies and is able to tolerate all of the major 8 food allergens.  Otherwise, there is no history of other atopic diseases, including asthma, drug allergies, food allergies, environmental allergies, stinging insect allergies, or urticaria. There is no significant infectious history. Vaccinations are up to date.    Past Medical History: Patient Active Problem List   Diagnosis Date Noted  . S/P hysterectomy 10/10/2015  . Pelvic pain in female 09/19/2015  . Menorrhagia with regular cycle 09/19/2015  . Dyspareunia in female 09/19/2015  . Dysmenorrhea 09/19/2015  . Left breast mass 04/12/2014  . Breast discharge 07/01/2013  . Vaginal discharge 07/01/2013  . Hernia of abdominal wall 01/04/2013  . Breast mass, right 10/20/2012  . Ectopic pregnancy     Medication List:  Allergies as of 08/27/2016      Reactions   Clarithromycin Other (See Comments)   Makes throat raw   Topamax [topiramate] Itching      Medication List       Accurate as of 08/27/16 11:41 AM. Always use your most recent med list.          ALPRAZolam 1 MG tablet Commonly known as:  XANAX Take 0.5-1 mg by mouth 3 (three) times daily as needed for anxiety.   omeprazole 20 MG capsule Commonly known as:  PRILOSEC Take 20 mg by mouth daily.   sertraline 100 MG tablet Commonly known as:  ZOLOFT Take 100 mg by mouth every morning.   Vitamin D (Ergocalciferol) 50000 units Caps capsule Commonly known as:  DRISDOL       Birth History: non-contributory.   Developmental History: non-contributory.   Past Surgical History: Past Surgical History:  Procedure Laterality Date  . ABDOMINAL HYSTERECTOMY N/A 10/10/2015   Procedure: HYSTERECTOMY ABDOMINAL;  Surgeon: Florian Buff, MD;  Location: [facility];  Service: Gynecology;  Laterality:  N/A;  . APPENDECTOMY    . APPENDECTOMY    . BILATERAL SALPINGECTOMY Bilateral 10/10/2015   Procedure: BILATERAL SALPINGECTOMY;  Surgeon: Florian Buff, MD;  Location: [facility];  Service: Gynecology;  Laterality: Bilateral;  . BREAST BIOPSY Right   . CESAREAN SECTION  04/24/2011   Procedure: CESAREAN SECTION;  Surgeon: Florian Buff, MD;  Location: [facility];  Service: Gynecology;  Laterality: N/A;  Primary/Previa  . CHOLECYSTECTOMY    . CYSTOSCOPY W/ URETERAL STENT PLACEMENT Left 08/04/2014   Procedure: CYSTOSCOPY WITH LEFT  RETROGRADE PYELOGRAM/ LEFT URETERAL STENT PLACEMENT;  Surgeon: Festus Aloe, MD;  Location: [facility];  Service: Urology;  Laterality: Left;  . TONSILLECTOMY       Family History: Family History  Problem Relation Age of Onset  . Depression Mother   .  Cancer Mother   . Seizures Mother   . Bipolar disorder Mother   . Asthma Mother   . Hypertension Father   . Asthma Father   . Heart disease Maternal Grandmother   . Cancer Maternal Grandmother     oral  . Hypertension Maternal Grandmother   . Other Maternal Grandmother     memory problems  . Stroke Paternal Grandfather   . Hypertension Paternal Grandfather   . Other Paternal Grandfather     brain tumors  . Heart disease Maternal Grandfather   . Pancreatitis Maternal Grandfather   . Cancer Paternal Grandmother     breast  . Heart disease Paternal Grandmother   . Kidney disease Paternal Grandmother     kidney failure  . Diabetes Paternal Grandmother   . Urticaria Son   . Allergic rhinitis Neg Hx   . Angioedema Neg Hx   . Eczema Neg Hx   . Immunodeficiency Neg Hx      Social History: Renai lives at home with her husband and three children (ages 66 through 66). 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 They live in a 39yo home. There is hardwood in the main living areas and carpeting in the bedrooms. She has a zoo of animals at her home, including dogs, cats, rabbit, and a bearded  dragon. She does not have dust mite covers on the bedding. There is no smoking exposure.     Review of Systems: a 14-point review of systems is pertinent for what is mentioned in HPI.  Otherwise, all other systems were negative. Constitutional: negative other than that listed in the HPI Eyes: negative other than that listed in the HPI Ears, nose, mouth, throat, and face: negative other than that listed in the HPI Respiratory: negative other than that listed in the HPI Cardiovascular: negative other than that listed in the HPI Gastrointestinal: positive for 6 months of dysphagia, otherwise negative other than that listed in the HPI Genitourinary: negative other than that listed in the HPI Integument: negative other than that listed in the HPI Hematologic: negative other than that listed in the HPI Musculoskeletal: negative other than that listed in the HPI Neurological: negative other than that listed in the HPI Allergy/Immunologic: negative other than that listed in the HPI    Objective:   Blood pressure 108/68, pulse (!) 103, temperature 97.1 F (36.2 C), temperature source Oral, resp. rate 18, height 5' 6.5" (1.689 m), weight 298 lb 6.4 oz (135.4 kg), last menstrual period 09/26/2015, SpO2 97 %. Body mass index is 47.44 kg/m.   Physical Exam:  General: Alert, interactive, in no acute distress. Obese female. Cooperative with the exam. Talkative. Eyes: No conjunctival injection present on the right, No conjunctival injection present on the left, PERRL bilaterally, No discharge on the right, No discharge on the left and No Horner-Trantas dots present Ears: Right TM pearly gray with normal light reflex, Left TM pearly gray with normal light reflex, Right TM intact without perforation and Left TM intact without perforation.  Nose/Throat: External nose within normal limits and septum midline, turbinates edematous and pale with clear discharge, post-pharynx erythematous with cobblestoning  in the posterior oropharynx. Tonsils 2+ without exudates Neck: Supple without thyromegaly.  Adenopathy: no enlarged lymph nodes appreciated in the anterior cervical, occipital, axillary, epitrochlear, inguinal, or popliteal regions Lungs: Clear to auscultation without wheezing, rhonchi or rales. No increased work of breathing. CV: Normal S1/S2, no murmurs. Capillary refill <2  seconds.  Abdomen: Nondistended, nontender. No guarding or rebound tenderness. Bowel sounds faint and present in all fields  Skin: Warm and dry, without lesions or rashes. Extremities:  No clubbing, cyanosis or edema. Neuro:   Grossly intact. No focal deficits appreciated. Responsive to questions.  Diagnostic studies:   Allergy Studies:   Indoor/Outdoor Percutaneous Adult Environmental Panel: negative to the entire panel with adequate controls.  Indoor/Outdoor Selected Intradermal Environmental Panel: positive to Grass mix, ragweed mix, tree mix, mold mix #2, mold mix #4, cat, cockroach and mite mix. Otherwise negative with adequate controls.      Salvatore Marvel, MD [facility]

## 2016-08-27 NOTE — Patient Instructions (Addendum)
1. Chronic rhinitis - Testing today showed: allergies to grass, ragweed, trees, molds, cat, cockroach, and a mild reaction to dust mite - Avoidance measures provided. - Start nasal saline rinses with NeilMed bottles twice daily. - Start fluticasone one spray per nostril twice daily. - Start Xyzal 5mg  daily. - If the above is not working, I would recommend starting allergy shots. - Information on allergy shots provided.   2. Chronic intractable headache - If the allergy medications do not improve your symptoms, we will try to get approval for a sinus CT. - Call us in two weeks with an update.  3. Return in about 3 months (around 11/27/2016).  Please inform us of any Emergency Department visits, hospitalizations, or changes in symptoms. Call us before going to the ED for breathing or allergy symptoms since we might be able to fit you in for a sick visit. Feel free to contact us anytime with any questions, problems, or concerns.  It was a pleasure to meet you and your family today! Happy spring!   Websites that have reliable patient information: 1. American Academy of Asthma, Allergy, and Immunology: www.aaaai.org 2. Food Allergy Research and Education (FARE): foodallergy.org 3. Mothers of Asthmatics: http://www.asthmacommunitynetwork.org 4. American College of Allergy, Asthma, and Immunology: www.acaai.org   Reducing Pollen Exposure  The American Academy of Allergy, Asthma and Immunology suggests the following steps to reduce your exposure to pollen during allergy seasons.    1. Do not hang sheets or clothing out to dry; pollen may collect on these items. 2. Do not mow lawns or spend time around freshly cut grass; mowing stirs up pollen. 3. Keep windows closed at night.  Keep car windows closed while driving. 4. Minimize morning activities outdoors, a time when pollen counts are usually at their highest. 5. Stay indoors as much as possible when pollen counts or humidity is high and on  windy days when pollen tends to remain in the air longer. 6. Use air conditioning when possible.  Many air conditioners have filters that trap the pollen spores. 7. Use a HEPA room air filter to remove pollen form the indoor air you breathe.  Control of Mold Allergen  Mold and fungi can grow on a variety of surfaces provided certain temperature and moisture conditions exist.  Outdoor molds grow on plants, decaying vegetation and soil.  The major outdoor mold, Alternaria and Cladosporium, are found in very high numbers during hot and dry conditions.  Generally, a late Summer - Fall peak is seen for common outdoor fungal spores.  Rain will temporarily lower outdoor mold spore count, but counts rise rapidly when the rainy period ends.  The most important indoor molds are Aspergillus and Penicillium.  Dark, humid and poorly ventilated basements are ideal sites for mold growth.  The next most common sites of mold growth are the bathroom and the kitchen.  Outdoor [employer] 1. Use air conditioning and keep windows closed 2. Avoid exposure to decaying vegetation. 3. Avoid leaf raking. 4. Avoid grain handling. 5. Consider wearing a face mask if working in moldy areas.  Indoor Mold Control 1. Maintain humidity below 50%. 2. Clean washable surfaces with 5% bleach solution. 3. Remove sources e.g. contaminated carpets.  Control of Dog or Cat Allergen  Avoidance is the best way to manage a dog or cat allergy. If you have a dog or cat and are allergic to dog or cats, consider removing the dog or cat from the home. If you have a dog or  cat but don't want to find it a new home, or if your family wants a pet even though someone in the household is allergic, here are some strategies that may help keep symptoms at bay:  1. Keep the pet out of your bedroom and restrict it to only a few rooms. Be advised that keeping the dog or cat in only one room will not limit the allergens to that room. 2. Don't pet, hug  or kiss the dog or cat; if you do, wash your hands with soap and water. 3. High-efficiency particulate air (HEPA) cleaners run continuously in a bedroom or living room can reduce allergen levels over time. 4. Regular use of a high-efficiency vacuum cleaner or a central vacuum can reduce allergen levels. 5. Giving your dog or cat a bath at least once a week can reduce airborne allergen.  Control of House Dust Mite Allergen    House dust mites play a major role in allergic asthma and rhinitis.  They occur in environments with high humidity wherever human skin, the food for dust mites is found. High levels have been detected in dust obtained from mattresses, pillows, carpets, upholstered furniture, bed covers, clothes and soft toys.  The principal allergen of the house dust mite is found in its feces.  A gram of dust may contain 1,000 mites and 250,000 fecal particles.  Mite antigen is easily measured in the air during house cleaning activities.    1. Encase mattresses, including the box spring, and pillow, in an air tight cover.  Seal the zipper end of the encased mattresses with wide adhesive tape. 2. Wash the bedding in water of 130 degrees Farenheit weekly.  Avoid cotton comforters/quilts and flannel bedding: the most ideal bed covering is the dacron comforter. 3. Remove all upholstered furniture from the bedroom. 4. Remove carpets, carpet padding, rugs, and non-washable window drapes from the bedroom.  Wash drapes weekly or use plastic window coverings. 5. Remove all non-washable stuffed toys from the bedroom.  Wash stuffed toys weekly. 6. Have the room cleaned frequently with a vacuum cleaner and a damp dust-mop.  The patient should not be in a room which is being cleaned and should wait 1 hour after cleaning before going into the room. 7. Close and seal all heating outlets in the bedroom.  Otherwise, the room will become filled with dust-laden air.  An electric heater can be used to heat the  room. 8. Reduce indoor humidity to less than 50%.  Do not use a humidifier.  Control of Cockroach Allergen  Cockroach allergen has been identified as an important cause of acute attacks of asthma, especially in urban settings.  There are fifty-five species of cockroach that exist in the [place], however only three, the [place], [place] species produce allergen that can affect patients with Asthma.  Allergens can be obtained from fecal particles, egg casings and secretions from cockroaches.    1. Remove food sources. 2. Reduce access to water. 3. Seal access and entry points. 4. Spray runways with 0.5-1% Diazinon or Chlorpyrifos 5. Blow boric acid power under stoves and refrigerator. 6. Place bait stations (hydramethylnon) at feeding sites.

## 2016-09-01 ENCOUNTER — Ambulatory Visit (INDEPENDENT_AMBULATORY_CARE_PROVIDER_SITE_OTHER): Payer: BLUE CROSS/BLUE SHIELD | Admitting: Otolaryngology

## 2016-09-01 DIAGNOSIS — G44219 Episodic tension-type headache, not intractable: Secondary | ICD-10-CM | POA: Diagnosis not present

## 2016-09-01 DIAGNOSIS — M9901 Segmental and somatic dysfunction of cervical region: Secondary | ICD-10-CM | POA: Diagnosis not present

## 2016-09-01 DIAGNOSIS — R1312 Dysphagia, oropharyngeal phase: Secondary | ICD-10-CM

## 2016-09-01 DIAGNOSIS — M546 Pain in thoracic spine: Secondary | ICD-10-CM | POA: Diagnosis not present

## 2016-09-01 DIAGNOSIS — K219 Gastro-esophageal reflux disease without esophagitis: Secondary | ICD-10-CM | POA: Diagnosis not present

## 2016-09-01 DIAGNOSIS — M9902 Segmental and somatic dysfunction of thoracic region: Secondary | ICD-10-CM | POA: Diagnosis not present

## 2016-09-12 ENCOUNTER — Emergency Department (HOSPITAL_COMMUNITY)
Admission: EM | Admit: 2016-09-12 | Discharge: 2016-09-12 | Disposition: A | Discharge status: Home / Self Care | Payer: BLUE CROSS/BLUE SHIELD | Attending: Emergency Medicine | Admitting: Emergency Medicine

## 2016-09-12 ENCOUNTER — Encounter (HOSPITAL_COMMUNITY): Payer: Self-pay

## 2016-09-12 ENCOUNTER — Emergency Department (HOSPITAL_COMMUNITY): Payer: BLUE CROSS/BLUE SHIELD

## 2016-09-12 DIAGNOSIS — Y9289 Other specified places as the place of occurrence of the external cause: Secondary | ICD-10-CM | POA: Insufficient documentation

## 2016-09-12 DIAGNOSIS — G43709 Chronic migraine without aura, not intractable, without status migrainosus: Secondary | ICD-10-CM

## 2016-09-12 DIAGNOSIS — Y999 Unspecified external cause status: Secondary | ICD-10-CM | POA: Diagnosis not present

## 2016-09-12 DIAGNOSIS — S8265XA Nondisplaced fracture of lateral malleolus of left fibula, initial encounter for closed fracture: Secondary | ICD-10-CM

## 2016-09-12 DIAGNOSIS — Z87891 Personal history of nicotine dependence: Secondary | ICD-10-CM | POA: Diagnosis not present

## 2016-09-12 DIAGNOSIS — Y9301 Activity, walking, marching and hiking: Secondary | ICD-10-CM | POA: Insufficient documentation

## 2016-09-12 DIAGNOSIS — S99912A Unspecified injury of left ankle, initial encounter: Secondary | ICD-10-CM | POA: Diagnosis not present

## 2016-09-12 DIAGNOSIS — X501XXA Overexertion from prolonged static or awkward postures, initial encounter: Secondary | ICD-10-CM | POA: Diagnosis not present

## 2016-09-12 DIAGNOSIS — S8262XA Displaced fracture of lateral malleolus of left fibula, initial encounter for closed fracture: Secondary | ICD-10-CM | POA: Diagnosis not present

## 2016-09-12 DIAGNOSIS — G43009 Migraine without aura, not intractable, without status migrainosus: Secondary | ICD-10-CM | POA: Diagnosis not present

## 2016-09-12 MED ORDER — NAPROXEN 500 MG PO TABS: 500.0000 mg | ORAL_TABLET | Freq: Two times a day (BID) | ORAL | 0 refills | Status: DC

## 2016-09-12 MED ORDER — KETOROLAC TROMETHAMINE 60 MG/2ML IM SOLN
60.0000 mg | Freq: Once | INTRAMUSCULAR | Status: AC
  Administered 2016-09-12: 60 mg via INTRAMUSCULAR
  Filled 2016-09-12: qty 2

## 2016-09-12 MED ORDER — METOCLOPRAMIDE HCL 5 MG/ML IJ SOLN
10.0000 mg | Freq: Once | INTRAMUSCULAR | Status: AC
  Administered 2016-09-12: 10 mg via INTRAMUSCULAR
  Filled 2016-09-12: qty 2

## 2016-09-12 MED ORDER — DIPHENHYDRAMINE HCL 25 MG PO CAPS
50.0000 mg | ORAL_CAPSULE | Freq: Once | ORAL | Status: AC
  Administered 2016-09-12: 50 mg via ORAL
  Filled 2016-09-12: qty 2

## 2016-09-12 NOTE — ED Notes (Signed)
TT in to assess 

## 2016-09-12 NOTE — Discharge Instructions (Signed)
Elevate and apply ice packs on and off to your ankle.  Call one of the orthopedic providers listed to arrange a follow-up appointment

## 2016-09-12 NOTE — ED Triage Notes (Signed)
Pt reports that she was walking into building to get massage and ankle twisted . Pt states she felt pop in left ankle. Lateral ankle swollen with abraison to right knee

## 2016-09-12 NOTE — ED Notes (Signed)
Migraine for 3 days went to get a massage to keep from coming to ED [facility] coming out of massage and now with L ankle pain

## 2016-09-13 NOTE — ED Provider Notes (Signed)
[facility] DEPT Provider Note   CSN: [id number] Arrival date & time: 09/12/16  1223     History   Chief Complaint Chief Complaint  Patient presents with  . Ankle Pain    HPI Julie [name] is a 41 y.o. female.  HPI  Julie [name] is a 41 y.o. female who presents to the Emergency Department complaining of left ankle pain and swelling after a mechanical fall that occurred shortly before ER arrival.  She states she tripped and twisted her ankle and felt a "pop" she has pain with weight bearing.  Denies numbness, pain proximal to the ankle.  She also reports having a headache for 2 days which she states is similar to previous migraine headaches.  She denies vomiting, dizziness, weakness, neck pain or stiffness.   Past Medical History:  Diagnosis Date  . Anemia   . Anemia   . Anxiety   . Breast discharge 07/01/2013   Pt noticed discharge, none on exam today will check TSH and Prolactin  . Breast mass, right 10/20/2012  . Depression   . Dysmenorrhea 09/19/2015  . Dyspareunia in female 09/19/2015  . Ectopic pregnancy   . Eczema   . GERD (gastroesophageal reflux disease)    no meds  . Headache   . Hernia of abdominal wall 01/04/2013  . Left breast mass 04/12/2014   Has pea sized nodule at 6 o'clock tender and mobile, will get mammogram and [place] if needed  . Menorrhagia with regular cycle 09/19/2015  . Pelvic pain in female 09/19/2015  . Placenta previa 2012   Current pregnancy   . Urinary tract infection   . Vaginal discharge 07/01/2013    Patient Active Problem List   Diagnosis Date Noted  . S/P hysterectomy 10/10/2015  . Pelvic pain in female 09/19/2015  . Menorrhagia with regular cycle 09/19/2015  . Dyspareunia in female 09/19/2015  . Dysmenorrhea 09/19/2015  . Left breast mass 04/12/2014  . Breast discharge 07/01/2013  . Vaginal discharge 07/01/2013  . Hernia of abdominal wall 01/04/2013  . Breast mass, right 10/20/2012  . Ectopic pregnancy     Past Surgical  History:  Procedure Laterality Date  . ABDOMINAL HYSTERECTOMY N/A 10/10/2015   Procedure: HYSTERECTOMY ABDOMINAL;  Surgeon: Florian Buff, MD;  Location: [facility];  Service: Gynecology;  Laterality: N/A;  . APPENDECTOMY    . APPENDECTOMY    . BILATERAL SALPINGECTOMY Bilateral 10/10/2015   Procedure: BILATERAL SALPINGECTOMY;  Surgeon: Florian Buff, MD;  Location: [facility];  Service: Gynecology;  Laterality: Bilateral;  . BREAST BIOPSY Right   . CESAREAN SECTION  04/24/2011   Procedure: CESAREAN SECTION;  Surgeon: Florian Buff, MD;  Location: [facility];  Service: Gynecology;  Laterality: N/A;  Primary/Previa  . CHOLECYSTECTOMY    . CYSTOSCOPY W/ URETERAL STENT PLACEMENT Left 08/04/2014   Procedure: CYSTOSCOPY WITH LEFT  RETROGRADE PYELOGRAM/ LEFT URETERAL STENT PLACEMENT;  Surgeon: Festus Aloe, MD;  Location: [facility];  Service: Urology;  Laterality: Left;  . TONSILLECTOMY      OB History    Gravida Para Term Preterm AB Living   2 1 0 1 1 1    SAB TAB Ectopic Multiple Live Births   0 0 1 0 1       Home Medications    Prior to Admission medications   Medication Sig Start Date End Date Taking? Authorizing Provider  ALPRAZolam Duanne Moron) 1 MG tablet Take 0.5-1 mg by mouth 3 (three) times daily as needed for anxiety.  Historical Provider, MD  fluticasone (FLONASE) 50 MCG/ACT nasal spray Place 1 spray into both nostrils 2 (two) times daily. 08/27/16   Valentina Shaggy, MD  levocetirizine (XYZAL) 5 MG tablet Take 1 tablet (5 mg total) by mouth every evening. 08/27/16   Valentina Shaggy, MD  naproxen (NAPROSYN) 500 MG tablet Take 1 tablet (500 mg total) by mouth 2 (two) times daily with a meal. 09/12/16   Dewight Catino, PA-C  omeprazole (PRILOSEC) 20 MG capsule Take 20 mg by mouth daily.  09/22/15   Historical Provider, MD  sertraline (ZOLOFT) 100 MG tablet Take 100 mg by mouth every morning.    Historical Provider, MD  Vitamin D, Ergocalciferol, (DRISDOL) 50000 units CAPS capsule  08/22/16    Historical Provider, MD    Family History Family History  Problem Relation Age of Onset  . Depression Mother   . Cancer Mother   . Seizures Mother   . Bipolar disorder Mother   . Asthma Mother   . Hypertension Father   . Asthma Father   . Heart disease Maternal Grandmother   . Cancer Maternal Grandmother     oral  . Hypertension Maternal Grandmother   . Other Maternal Grandmother     memory problems  . Stroke Paternal Grandfather   . Hypertension Paternal Grandfather   . Other Paternal Grandfather     brain tumors  . Heart disease Maternal Grandfather   . Pancreatitis Maternal Grandfather   . Cancer Paternal Grandmother     breast  . Heart disease Paternal Grandmother   . Kidney disease Paternal Grandmother     kidney failure  . Diabetes Paternal Grandmother   . Urticaria Son   . Allergic rhinitis Neg Hx   . Angioedema Neg Hx   . Eczema Neg Hx   . Immunodeficiency Neg Hx     Social History Social History  Substance Use Topics  . Smoking status: Former Smoker    Years: 14.00    Types: Cigarettes    Quit date: 09/07/2009  . Smokeless tobacco: Never Used  . Alcohol use Yes     Comment: occ mixed drink     Allergies   Clarithromycin and Topamax [topiramate]   Review of Systems Review of Systems  Constitutional: Negative for activity change, appetite change and fever.  HENT: Negative for facial swelling and trouble swallowing.   Eyes: Positive for photophobia. Negative for pain and visual disturbance.  Respiratory: Negative for shortness of breath.   Cardiovascular: Negative for chest pain.  Gastrointestinal: Negative for nausea and vomiting.  Musculoskeletal: Positive for arthralgias (left ankle pain) and joint swelling. Negative for neck pain and neck stiffness.  Skin: Negative for rash and wound.  Neurological: Positive for headaches. Negative for dizziness, facial asymmetry, speech difficulty, weakness and numbness.  Psychiatric/Behavioral: Negative for  confusion and decreased concentration.  All other systems reviewed and are negative.    Physical Exam Updated Vital Signs BP 126/81 (BP Location: Right Arm)   Pulse 73   Temp 98.2 F (36.8 C) (Oral)   Resp 16   Ht 5\' 7"  (1.702 m)   Wt 133.8 kg   LMP 09/26/2015 (Exact Date)   SpO2 99%   BMI 46.20 kg/m   Physical Exam  Constitutional: She is oriented to person, place, and time. She appears well-developed and well-nourished. No distress.  HENT:  Head: Normocephalic and atraumatic.  Mouth/Throat: Oropharynx is clear and moist.  Eyes: Conjunctivae and EOM are normal. Pupils are equal, round, and  reactive to light.  Neck: Normal range of motion, full passive range of motion without pain and phonation normal. Neck supple. No spinous process tenderness and no muscular tenderness present. No neck rigidity. No Kernig's sign noted.  Cardiovascular: Normal rate, regular rhythm and intact distal pulses.   No murmur heard. Pulmonary/Chest: Effort normal and breath sounds normal. No respiratory distress.  Musculoskeletal: Normal range of motion. She exhibits edema and tenderness.  ttp of left lateral ankle, moderate edema. No bony deformity.  Neurological: She is alert and oriented to person, place, and time. She has normal strength. No cranial nerve deficit or sensory deficit. She exhibits normal muscle tone. Coordination and gait normal. GCS eye subscore is 4. GCS verbal subscore is 5. GCS motor subscore is 6.  Reflex Scores:      Tricep reflexes are 2+ on the right side and 2+ on the left side.      Bicep reflexes are 2+ on the right side and 2+ on the left side. Skin: Skin is warm and dry.  No skin changes  Psychiatric: She has a normal mood and affect.  Nursing note and vitals reviewed.    ED Treatments / Results  Labs (all labs ordered are listed, but only abnormal results are displayed) Labs Reviewed - No data to display  EKG  EKG Interpretation None       Radiology Dg  Ankle Complete Left  Result Date: 09/12/2016 CLINICAL DATA:  Twisted ankle.  Pain swelling. EXAM: LEFT ANKLE COMPLETE - 3+ VIEW COMPARISON:  01/07/2002 report. FINDINGS: Diffuse soft tissue swelling. Subtle fracture of the distal tip of the lateral malleolus, possibly old noted. Medial malleolus is intact. Diffuse degenerative change. IMPRESSION: Diffuse soft tissue swelling. Subtle fracture of the distal tip of the lateral malleolus noted. This fracture may be old. Electronically Signed   By: Marcello Moores  Register   On: 09/12/2016 12:39    Procedures Procedures (including critical care time)  Medications Ordered in ED Medications  ketorolac (TORADOL) injection 60 mg (60 mg Intramuscular Given 09/12/16 1334)  diphenhydrAMINE (BENADRYL) capsule 50 mg (50 mg Oral Given 09/12/16 1335)  metoCLOPramide (REGLAN) injection 10 mg (10 mg Intramuscular Given 09/12/16 1333)     Initial Impression / Assessment and Plan / ED Course  I have reviewed the triage vital signs and the nursing notes.  Pertinent labs & imaging results that were available during my care of the patient were reviewed by me and considered in my medical decision making (see chart for details).     patient has focal tenderness of the left lateral malleolus with edema.  Possible new fx.  NV intact.    Cam walker applied by nursing.  Pain improved, remains NV intact.  Headache improved after medications.  No focal neuro deficits, no nuchal rigidity.  Pain similar to previous.  She agrees to elevate, minimal wt bearing and ortho f/u.     Final Clinical Impressions(s) / ED Diagnoses   Final diagnoses:  Closed nondisplaced fracture of lateral malleolus of left fibula, initial encounter  Chronic migraine without aura without status migrainosus, not intractable    New Prescriptions Discharge Medication List as of 09/12/2016  2:24 PM    START taking these medications   Details  naproxen (NAPROSYN) 500 MG tablet Take 1 tablet (500 mg total)  by mouth 2 (two) times daily with a meal., Starting Fri 09/12/2016, Print         Jeana Kersting [place], PA-C 09/13/16 1944    Noemi Chapel, MD  09/13/16 1952  

## 2016-09-15 DIAGNOSIS — Z6841 Body Mass Index (BMI) 40.0 and over, adult: Secondary | ICD-10-CM | POA: Diagnosis not present

## 2016-09-15 DIAGNOSIS — S82832A Other fracture of upper and lower end of left fibula, initial encounter for closed fracture: Secondary | ICD-10-CM | POA: Diagnosis not present

## 2016-09-17 DIAGNOSIS — S8265XA Nondisplaced fracture of lateral malleolus of left fibula, initial encounter for closed fracture: Secondary | ICD-10-CM | POA: Diagnosis not present

## 2016-09-17 DIAGNOSIS — M25572 Pain in left ankle and joints of left foot: Secondary | ICD-10-CM | POA: Diagnosis not present

## 2016-09-23 DIAGNOSIS — M9902 Segmental and somatic dysfunction of thoracic region: Secondary | ICD-10-CM | POA: Diagnosis not present

## 2016-09-23 DIAGNOSIS — M9901 Segmental and somatic dysfunction of cervical region: Secondary | ICD-10-CM | POA: Diagnosis not present

## 2016-09-23 DIAGNOSIS — G44219 Episodic tension-type headache, not intractable: Secondary | ICD-10-CM | POA: Diagnosis not present

## 2016-09-23 DIAGNOSIS — M546 Pain in thoracic spine: Secondary | ICD-10-CM | POA: Diagnosis not present

## 2016-10-03 DIAGNOSIS — G44219 Episodic tension-type headache, not intractable: Secondary | ICD-10-CM | POA: Diagnosis not present

## 2016-10-03 DIAGNOSIS — M9902 Segmental and somatic dysfunction of thoracic region: Secondary | ICD-10-CM | POA: Diagnosis not present

## 2016-10-03 DIAGNOSIS — M9901 Segmental and somatic dysfunction of cervical region: Secondary | ICD-10-CM | POA: Diagnosis not present

## 2016-10-03 DIAGNOSIS — M546 Pain in thoracic spine: Secondary | ICD-10-CM | POA: Diagnosis not present

## 2016-10-13 DIAGNOSIS — M9901 Segmental and somatic dysfunction of cervical region: Secondary | ICD-10-CM | POA: Diagnosis not present

## 2016-10-13 DIAGNOSIS — M9902 Segmental and somatic dysfunction of thoracic region: Secondary | ICD-10-CM | POA: Diagnosis not present

## 2016-10-13 DIAGNOSIS — G44219 Episodic tension-type headache, not intractable: Secondary | ICD-10-CM | POA: Diagnosis not present

## 2016-10-13 DIAGNOSIS — M546 Pain in thoracic spine: Secondary | ICD-10-CM | POA: Diagnosis not present

## 2016-10-16 DIAGNOSIS — Z1389 Encounter for screening for other disorder: Secondary | ICD-10-CM | POA: Diagnosis not present

## 2016-10-16 DIAGNOSIS — L255 Unspecified contact dermatitis due to plants, except food: Secondary | ICD-10-CM | POA: Diagnosis not present

## 2016-10-16 DIAGNOSIS — L259 Unspecified contact dermatitis, unspecified cause: Secondary | ICD-10-CM | POA: Diagnosis not present

## 2016-10-16 DIAGNOSIS — Z6841 Body Mass Index (BMI) 40.0 and over, adult: Secondary | ICD-10-CM | POA: Diagnosis not present

## 2016-10-30 DIAGNOSIS — J029 Acute pharyngitis, unspecified: Secondary | ICD-10-CM | POA: Diagnosis not present

## 2016-10-30 DIAGNOSIS — Z6841 Body Mass Index (BMI) 40.0 and over, adult: Secondary | ICD-10-CM | POA: Diagnosis not present

## 2016-10-30 DIAGNOSIS — Z1389 Encounter for screening for other disorder: Secondary | ICD-10-CM | POA: Diagnosis not present

## 2016-11-12 DIAGNOSIS — G44219 Episodic tension-type headache, not intractable: Secondary | ICD-10-CM | POA: Diagnosis not present

## 2016-11-12 DIAGNOSIS — M9902 Segmental and somatic dysfunction of thoracic region: Secondary | ICD-10-CM | POA: Diagnosis not present

## 2016-11-12 DIAGNOSIS — M546 Pain in thoracic spine: Secondary | ICD-10-CM | POA: Diagnosis not present

## 2016-11-12 DIAGNOSIS — M9901 Segmental and somatic dysfunction of cervical region: Secondary | ICD-10-CM | POA: Diagnosis not present

## 2016-11-29 DIAGNOSIS — R350 Frequency of micturition: Secondary | ICD-10-CM | POA: Diagnosis not present

## 2016-11-29 DIAGNOSIS — R109 Unspecified abdominal pain: Secondary | ICD-10-CM | POA: Diagnosis not present

## 2016-11-29 DIAGNOSIS — Z87442 Personal history of urinary calculi: Secondary | ICD-10-CM | POA: Diagnosis not present

## 2016-12-02 ENCOUNTER — Ambulatory Visit: Payer: BLUE CROSS/BLUE SHIELD | Admitting: Allergy & Immunology

## 2016-12-02 DIAGNOSIS — J309 Allergic rhinitis, unspecified: Secondary | ICD-10-CM

## 2016-12-04 DIAGNOSIS — G44219 Episodic tension-type headache, not intractable: Secondary | ICD-10-CM | POA: Diagnosis not present

## 2016-12-04 DIAGNOSIS — M9901 Segmental and somatic dysfunction of cervical region: Secondary | ICD-10-CM | POA: Diagnosis not present

## 2016-12-04 DIAGNOSIS — M546 Pain in thoracic spine: Secondary | ICD-10-CM | POA: Diagnosis not present

## 2016-12-04 DIAGNOSIS — M9902 Segmental and somatic dysfunction of thoracic region: Secondary | ICD-10-CM | POA: Diagnosis not present

## 2016-12-16 DIAGNOSIS — G44219 Episodic tension-type headache, not intractable: Secondary | ICD-10-CM | POA: Diagnosis not present

## 2016-12-16 DIAGNOSIS — M546 Pain in thoracic spine: Secondary | ICD-10-CM | POA: Diagnosis not present

## 2016-12-16 DIAGNOSIS — M9902 Segmental and somatic dysfunction of thoracic region: Secondary | ICD-10-CM | POA: Diagnosis not present

## 2016-12-16 DIAGNOSIS — M9901 Segmental and somatic dysfunction of cervical region: Secondary | ICD-10-CM | POA: Diagnosis not present

## 2016-12-17 ENCOUNTER — Telehealth: Payer: Self-pay | Admitting: Allergy

## 2016-12-17 NOTE — Telephone Encounter (Signed)
Daisee Centner called about bill for a no show. Would like to talk to you about it. Phone number [phone].

## 2016-12-17 NOTE — Telephone Encounter (Signed)
I talked to [facility] and informed her I removed her first no show fee.-CM

## 2016-12-17 NOTE — Telephone Encounter (Signed)
Julie [name] will handle

## 2016-12-19 DIAGNOSIS — G43009 Migraine without aura, not intractable, without status migrainosus: Secondary | ICD-10-CM | POA: Diagnosis not present

## 2016-12-19 DIAGNOSIS — Z0001 Encounter for general adult medical examination with abnormal findings: Secondary | ICD-10-CM | POA: Diagnosis not present

## 2016-12-19 DIAGNOSIS — Z789 Other specified health status: Secondary | ICD-10-CM | POA: Diagnosis not present

## 2016-12-19 DIAGNOSIS — Z1389 Encounter for screening for other disorder: Secondary | ICD-10-CM | POA: Diagnosis not present

## 2016-12-19 DIAGNOSIS — E782 Mixed hyperlipidemia: Secondary | ICD-10-CM | POA: Diagnosis not present

## 2016-12-19 DIAGNOSIS — Z111 Encounter for screening for respiratory tuberculosis: Secondary | ICD-10-CM | POA: Diagnosis not present

## 2016-12-19 DIAGNOSIS — Z6841 Body Mass Index (BMI) 40.0 and over, adult: Secondary | ICD-10-CM | POA: Diagnosis not present

## 2016-12-22 DIAGNOSIS — Z5181 Encounter for therapeutic drug level monitoring: Secondary | ICD-10-CM | POA: Diagnosis not present

## 2016-12-31 DIAGNOSIS — H04121 Dry eye syndrome of right lacrimal gland: Secondary | ICD-10-CM | POA: Diagnosis not present

## 2016-12-31 DIAGNOSIS — Z23 Encounter for immunization: Secondary | ICD-10-CM | POA: Diagnosis not present

## 2017-01-12 DIAGNOSIS — L905 Scar conditions and fibrosis of skin: Secondary | ICD-10-CM | POA: Diagnosis not present

## 2017-01-19 DIAGNOSIS — M9901 Segmental and somatic dysfunction of cervical region: Secondary | ICD-10-CM | POA: Diagnosis not present

## 2017-01-19 DIAGNOSIS — M9902 Segmental and somatic dysfunction of thoracic region: Secondary | ICD-10-CM | POA: Diagnosis not present

## 2017-01-19 DIAGNOSIS — M546 Pain in thoracic spine: Secondary | ICD-10-CM | POA: Diagnosis not present

## 2017-01-19 DIAGNOSIS — G44219 Episodic tension-type headache, not intractable: Secondary | ICD-10-CM | POA: Diagnosis not present

## 2017-02-02 DIAGNOSIS — M546 Pain in thoracic spine: Secondary | ICD-10-CM | POA: Diagnosis not present

## 2017-02-02 DIAGNOSIS — G44219 Episodic tension-type headache, not intractable: Secondary | ICD-10-CM | POA: Diagnosis not present

## 2017-02-02 DIAGNOSIS — M9901 Segmental and somatic dysfunction of cervical region: Secondary | ICD-10-CM | POA: Diagnosis not present

## 2017-02-02 DIAGNOSIS — M9902 Segmental and somatic dysfunction of thoracic region: Secondary | ICD-10-CM | POA: Diagnosis not present

## 2017-02-10 DIAGNOSIS — M9901 Segmental and somatic dysfunction of cervical region: Secondary | ICD-10-CM | POA: Diagnosis not present

## 2017-02-10 DIAGNOSIS — M546 Pain in thoracic spine: Secondary | ICD-10-CM | POA: Diagnosis not present

## 2017-02-10 DIAGNOSIS — G44219 Episodic tension-type headache, not intractable: Secondary | ICD-10-CM | POA: Diagnosis not present

## 2017-02-10 DIAGNOSIS — M9902 Segmental and somatic dysfunction of thoracic region: Secondary | ICD-10-CM | POA: Diagnosis not present

## 2017-02-13 DIAGNOSIS — Z6841 Body Mass Index (BMI) 40.0 and over, adult: Secondary | ICD-10-CM | POA: Diagnosis not present

## 2017-02-13 DIAGNOSIS — Z1389 Encounter for screening for other disorder: Secondary | ICD-10-CM | POA: Diagnosis not present

## 2017-02-13 DIAGNOSIS — J069 Acute upper respiratory infection, unspecified: Secondary | ICD-10-CM | POA: Diagnosis not present

## 2017-02-20 DIAGNOSIS — M9901 Segmental and somatic dysfunction of cervical region: Secondary | ICD-10-CM | POA: Diagnosis not present

## 2017-02-20 DIAGNOSIS — G44219 Episodic tension-type headache, not intractable: Secondary | ICD-10-CM | POA: Diagnosis not present

## 2017-02-20 DIAGNOSIS — M546 Pain in thoracic spine: Secondary | ICD-10-CM | POA: Diagnosis not present

## 2017-02-20 DIAGNOSIS — M9902 Segmental and somatic dysfunction of thoracic region: Secondary | ICD-10-CM | POA: Diagnosis not present

## 2017-02-25 DIAGNOSIS — M9901 Segmental and somatic dysfunction of cervical region: Secondary | ICD-10-CM | POA: Diagnosis not present

## 2017-02-25 DIAGNOSIS — M546 Pain in thoracic spine: Secondary | ICD-10-CM | POA: Diagnosis not present

## 2017-02-25 DIAGNOSIS — M9902 Segmental and somatic dysfunction of thoracic region: Secondary | ICD-10-CM | POA: Diagnosis not present

## 2017-02-25 DIAGNOSIS — G44219 Episodic tension-type headache, not intractable: Secondary | ICD-10-CM | POA: Diagnosis not present

## 2017-03-04 DIAGNOSIS — Z23 Encounter for immunization: Secondary | ICD-10-CM | POA: Diagnosis not present

## 2017-03-06 DIAGNOSIS — G44219 Episodic tension-type headache, not intractable: Secondary | ICD-10-CM | POA: Diagnosis not present

## 2017-03-06 DIAGNOSIS — M9901 Segmental and somatic dysfunction of cervical region: Secondary | ICD-10-CM | POA: Diagnosis not present

## 2017-03-06 DIAGNOSIS — M546 Pain in thoracic spine: Secondary | ICD-10-CM | POA: Diagnosis not present

## 2017-03-06 DIAGNOSIS — M9902 Segmental and somatic dysfunction of thoracic region: Secondary | ICD-10-CM | POA: Diagnosis not present

## 2017-03-13 DIAGNOSIS — M9902 Segmental and somatic dysfunction of thoracic region: Secondary | ICD-10-CM | POA: Diagnosis not present

## 2017-03-13 DIAGNOSIS — G44219 Episodic tension-type headache, not intractable: Secondary | ICD-10-CM | POA: Diagnosis not present

## 2017-03-13 DIAGNOSIS — M9901 Segmental and somatic dysfunction of cervical region: Secondary | ICD-10-CM | POA: Diagnosis not present

## 2017-03-13 DIAGNOSIS — M546 Pain in thoracic spine: Secondary | ICD-10-CM | POA: Diagnosis not present

## 2017-03-16 DIAGNOSIS — M9901 Segmental and somatic dysfunction of cervical region: Secondary | ICD-10-CM | POA: Diagnosis not present

## 2017-03-16 DIAGNOSIS — M9902 Segmental and somatic dysfunction of thoracic region: Secondary | ICD-10-CM | POA: Diagnosis not present

## 2017-03-16 DIAGNOSIS — G44219 Episodic tension-type headache, not intractable: Secondary | ICD-10-CM | POA: Diagnosis not present

## 2017-03-16 DIAGNOSIS — M546 Pain in thoracic spine: Secondary | ICD-10-CM | POA: Diagnosis not present

## 2017-03-19 DIAGNOSIS — M9902 Segmental and somatic dysfunction of thoracic region: Secondary | ICD-10-CM | POA: Diagnosis not present

## 2017-03-19 DIAGNOSIS — M546 Pain in thoracic spine: Secondary | ICD-10-CM | POA: Diagnosis not present

## 2017-03-19 DIAGNOSIS — G44219 Episodic tension-type headache, not intractable: Secondary | ICD-10-CM | POA: Diagnosis not present

## 2017-03-19 DIAGNOSIS — M9901 Segmental and somatic dysfunction of cervical region: Secondary | ICD-10-CM | POA: Diagnosis not present

## 2017-03-24 ENCOUNTER — Other Ambulatory Visit: Payer: Self-pay | Admitting: Allergy & Immunology

## 2017-03-25 DIAGNOSIS — J3089 Other allergic rhinitis: Secondary | ICD-10-CM | POA: Diagnosis not present

## 2017-03-25 DIAGNOSIS — Z6841 Body Mass Index (BMI) 40.0 and over, adult: Secondary | ICD-10-CM | POA: Diagnosis not present

## 2017-03-25 DIAGNOSIS — J019 Acute sinusitis, unspecified: Secondary | ICD-10-CM | POA: Diagnosis not present

## 2017-03-25 DIAGNOSIS — Z1389 Encounter for screening for other disorder: Secondary | ICD-10-CM | POA: Diagnosis not present

## 2017-03-30 DIAGNOSIS — M546 Pain in thoracic spine: Secondary | ICD-10-CM | POA: Diagnosis not present

## 2017-03-30 DIAGNOSIS — G44219 Episodic tension-type headache, not intractable: Secondary | ICD-10-CM | POA: Diagnosis not present

## 2017-03-30 DIAGNOSIS — M9902 Segmental and somatic dysfunction of thoracic region: Secondary | ICD-10-CM | POA: Diagnosis not present

## 2017-03-30 DIAGNOSIS — M9901 Segmental and somatic dysfunction of cervical region: Secondary | ICD-10-CM | POA: Diagnosis not present

## 2017-04-13 DIAGNOSIS — G44219 Episodic tension-type headache, not intractable: Secondary | ICD-10-CM | POA: Diagnosis not present

## 2017-04-13 DIAGNOSIS — M546 Pain in thoracic spine: Secondary | ICD-10-CM | POA: Diagnosis not present

## 2017-04-13 DIAGNOSIS — M9901 Segmental and somatic dysfunction of cervical region: Secondary | ICD-10-CM | POA: Diagnosis not present

## 2017-04-13 DIAGNOSIS — M9902 Segmental and somatic dysfunction of thoracic region: Secondary | ICD-10-CM | POA: Diagnosis not present

## 2017-04-15 DIAGNOSIS — G44219 Episodic tension-type headache, not intractable: Secondary | ICD-10-CM | POA: Diagnosis not present

## 2017-04-15 DIAGNOSIS — M546 Pain in thoracic spine: Secondary | ICD-10-CM | POA: Diagnosis not present

## 2017-04-15 DIAGNOSIS — M9902 Segmental and somatic dysfunction of thoracic region: Secondary | ICD-10-CM | POA: Diagnosis not present

## 2017-04-15 DIAGNOSIS — M9901 Segmental and somatic dysfunction of cervical region: Secondary | ICD-10-CM | POA: Diagnosis not present

## 2017-04-25 DIAGNOSIS — J069 Acute upper respiratory infection, unspecified: Secondary | ICD-10-CM | POA: Diagnosis not present

## 2017-05-04 DIAGNOSIS — G44219 Episodic tension-type headache, not intractable: Secondary | ICD-10-CM | POA: Diagnosis not present

## 2017-05-04 DIAGNOSIS — M546 Pain in thoracic spine: Secondary | ICD-10-CM | POA: Diagnosis not present

## 2017-05-04 DIAGNOSIS — M9902 Segmental and somatic dysfunction of thoracic region: Secondary | ICD-10-CM | POA: Diagnosis not present

## 2017-05-04 DIAGNOSIS — M9901 Segmental and somatic dysfunction of cervical region: Secondary | ICD-10-CM | POA: Diagnosis not present

## 2017-05-08 DIAGNOSIS — M546 Pain in thoracic spine: Secondary | ICD-10-CM | POA: Diagnosis not present

## 2017-05-08 DIAGNOSIS — G44219 Episodic tension-type headache, not intractable: Secondary | ICD-10-CM | POA: Diagnosis not present

## 2017-05-08 DIAGNOSIS — M9901 Segmental and somatic dysfunction of cervical region: Secondary | ICD-10-CM | POA: Diagnosis not present

## 2017-05-08 DIAGNOSIS — M9902 Segmental and somatic dysfunction of thoracic region: Secondary | ICD-10-CM | POA: Diagnosis not present

## 2017-05-11 DIAGNOSIS — M546 Pain in thoracic spine: Secondary | ICD-10-CM | POA: Diagnosis not present

## 2017-05-11 DIAGNOSIS — M9901 Segmental and somatic dysfunction of cervical region: Secondary | ICD-10-CM | POA: Diagnosis not present

## 2017-05-11 DIAGNOSIS — G44219 Episodic tension-type headache, not intractable: Secondary | ICD-10-CM | POA: Diagnosis not present

## 2017-05-11 DIAGNOSIS — M9902 Segmental and somatic dysfunction of thoracic region: Secondary | ICD-10-CM | POA: Diagnosis not present

## 2017-05-13 DIAGNOSIS — M9902 Segmental and somatic dysfunction of thoracic region: Secondary | ICD-10-CM | POA: Diagnosis not present

## 2017-05-13 DIAGNOSIS — G44219 Episodic tension-type headache, not intractable: Secondary | ICD-10-CM | POA: Diagnosis not present

## 2017-05-13 DIAGNOSIS — M546 Pain in thoracic spine: Secondary | ICD-10-CM | POA: Diagnosis not present

## 2017-05-13 DIAGNOSIS — M9901 Segmental and somatic dysfunction of cervical region: Secondary | ICD-10-CM | POA: Diagnosis not present

## 2017-05-20 DIAGNOSIS — Z0000 Encounter for general adult medical examination without abnormal findings: Secondary | ICD-10-CM | POA: Diagnosis not present

## 2017-05-21 ENCOUNTER — Other Ambulatory Visit: Payer: Self-pay | Admitting: Allergy & Immunology

## 2017-05-22 DIAGNOSIS — G44219 Episodic tension-type headache, not intractable: Secondary | ICD-10-CM | POA: Diagnosis not present

## 2017-05-22 DIAGNOSIS — M9902 Segmental and somatic dysfunction of thoracic region: Secondary | ICD-10-CM | POA: Diagnosis not present

## 2017-05-22 DIAGNOSIS — M546 Pain in thoracic spine: Secondary | ICD-10-CM | POA: Diagnosis not present

## 2017-05-22 DIAGNOSIS — M9901 Segmental and somatic dysfunction of cervical region: Secondary | ICD-10-CM | POA: Diagnosis not present

## 2017-05-29 DIAGNOSIS — M9901 Segmental and somatic dysfunction of cervical region: Secondary | ICD-10-CM | POA: Diagnosis not present

## 2017-05-29 DIAGNOSIS — G44219 Episodic tension-type headache, not intractable: Secondary | ICD-10-CM | POA: Diagnosis not present

## 2017-05-29 DIAGNOSIS — M546 Pain in thoracic spine: Secondary | ICD-10-CM | POA: Diagnosis not present

## 2017-05-29 DIAGNOSIS — M9902 Segmental and somatic dysfunction of thoracic region: Secondary | ICD-10-CM | POA: Diagnosis not present

## 2017-06-11 DIAGNOSIS — L821 Other seborrheic keratosis: Secondary | ICD-10-CM | POA: Diagnosis not present

## 2017-06-11 DIAGNOSIS — D224 Melanocytic nevi of scalp and neck: Secondary | ICD-10-CM | POA: Diagnosis not present

## 2017-06-11 DIAGNOSIS — D485 Neoplasm of uncertain behavior of skin: Secondary | ICD-10-CM | POA: Diagnosis not present

## 2017-06-11 DIAGNOSIS — L905 Scar conditions and fibrosis of skin: Secondary | ICD-10-CM | POA: Diagnosis not present

## 2017-06-19 DIAGNOSIS — H16142 Punctate keratitis, left eye: Secondary | ICD-10-CM | POA: Diagnosis not present

## 2017-06-19 DIAGNOSIS — H16212 Exposure keratoconjunctivitis, left eye: Secondary | ICD-10-CM | POA: Diagnosis not present

## 2017-06-27 DIAGNOSIS — J09X2 Influenza due to identified novel influenza A virus with other respiratory manifestations: Secondary | ICD-10-CM | POA: Diagnosis not present

## 2017-07-15 DIAGNOSIS — Z23 Encounter for immunization: Secondary | ICD-10-CM | POA: Diagnosis not present

## 2017-07-29 DIAGNOSIS — G44219 Episodic tension-type headache, not intractable: Secondary | ICD-10-CM | POA: Diagnosis not present

## 2017-07-29 DIAGNOSIS — M9901 Segmental and somatic dysfunction of cervical region: Secondary | ICD-10-CM | POA: Diagnosis not present

## 2017-07-29 DIAGNOSIS — M546 Pain in thoracic spine: Secondary | ICD-10-CM | POA: Diagnosis not present

## 2017-07-29 DIAGNOSIS — M9902 Segmental and somatic dysfunction of thoracic region: Secondary | ICD-10-CM | POA: Diagnosis not present

## 2017-07-31 DIAGNOSIS — J069 Acute upper respiratory infection, unspecified: Secondary | ICD-10-CM | POA: Diagnosis not present

## 2017-07-31 DIAGNOSIS — J329 Chronic sinusitis, unspecified: Secondary | ICD-10-CM | POA: Diagnosis not present

## 2017-07-31 DIAGNOSIS — H6691 Otitis media, unspecified, right ear: Secondary | ICD-10-CM | POA: Diagnosis not present

## 2017-08-16 ENCOUNTER — Encounter (HOSPITAL_COMMUNITY): Payer: Self-pay | Admitting: Emergency Medicine

## 2017-08-16 ENCOUNTER — Ambulatory Visit (HOSPITAL_COMMUNITY)
Admission: EM | Admit: 2017-08-16 | Discharge: 2017-08-16 | Disposition: A | Discharge status: Home / Self Care | Payer: BLUE CROSS/BLUE SHIELD | Attending: Internal Medicine | Admitting: Internal Medicine

## 2017-08-16 DIAGNOSIS — M545 Low back pain: Secondary | ICD-10-CM | POA: Diagnosis not present

## 2017-08-16 DIAGNOSIS — R1030 Lower abdominal pain, unspecified: Secondary | ICD-10-CM | POA: Diagnosis not present

## 2017-08-16 DIAGNOSIS — R350 Frequency of micturition: Secondary | ICD-10-CM | POA: Diagnosis not present

## 2017-08-16 LAB — POCT URINALYSIS DIP (DEVICE)
Bilirubin Urine: NEGATIVE
Glucose, UA: NEGATIVE mg/dL
Hgb urine dipstick: NEGATIVE
Ketones, ur: NEGATIVE mg/dL
Leukocytes, UA: NEGATIVE
Nitrite: NEGATIVE
Protein, ur: NEGATIVE mg/dL
Specific Gravity, Urine: 1.015 (ref 1.005–1.030)
Urobilinogen, UA: 0.2 mg/dL (ref 0.0–1.0)
pH: 6 (ref 5.0–8.0)

## 2017-08-16 MED ORDER — KETOROLAC TROMETHAMINE 60 MG/2ML IM SOLN
INTRAMUSCULAR | Status: AC
  Filled 2017-08-16: qty 2

## 2017-08-16 MED ORDER — KETOROLAC TROMETHAMINE 60 MG/2ML IM SOLN
60.0000 mg | Freq: Once | INTRAMUSCULAR | Status: AC
  Administered 2017-08-16: 60 mg via INTRAMUSCULAR

## 2017-08-16 MED ORDER — POLYETHYLENE GLYCOL 3350 17 G PO PACK: 17.0000 g | PACK | Freq: Every day | ORAL | 0 refills | Status: DC | PRN

## 2017-08-16 NOTE — ED Triage Notes (Signed)
Pt here for UTI sx with urgency and frequency

## 2017-08-16 NOTE — Discharge Instructions (Signed)
Your urine is negative for infection today.  It is possible that some constipation and gas is causing some of your urinary symptoms. I would recommend miralax daily, take with 8oz of fluid to promote large bowel movement.  May take two doses a day if no results. Increase water intake to prevent constipation and to empty bladder regularly. Increase fiber intake, light and regular activity to promote BM as well.  If symptoms worsen or do not improve in the next week to return to be seen or to follow up with PCP.

## 2017-08-16 NOTE — ED Provider Notes (Signed)
Julie [name]    CSN: [id number] Arrival date & time: 08/16/17  1946     History   Chief Complaint Chief Complaint  Patient presents with  . Urinary Tract Infection    HPI Julie [name] is a 42 y.o. female.   [name] presents with sensation of incomplete emptying of bladder and frequent urination, abdominal pressure which started two days ago. Without burning with urination but pressure sensation with urination. Denies blood to urine. Mild low back pain. Uncomfortable with passing gas. States had a BM today which she noticed mucus to. States prior to this her last BM was two days prior. This is longer than normal for her. States she can alternate between constipation and loose stool as she has had her gallbladder removed. History of hysterectomy. Denies vaginal symptoms. No fevers. History of kidney stones, states this does not feel similar necessarily. Has had UTI's in the past as well. Hx appendectomy.     ROS per HPI.       Past Medical History:  Diagnosis Date  . Anemia   . Anemia   . Anxiety   . Breast discharge 07/01/2013   Pt noticed discharge, none on exam today will check TSH and Prolactin  . Breast mass, right 10/20/2012  . Depression   . Dysmenorrhea 09/19/2015  . Dyspareunia in female 09/19/2015  . Ectopic pregnancy   . Eczema   . GERD (gastroesophageal reflux disease)    no meds  . Headache   . Hernia of abdominal wall 01/04/2013  . Left breast mass 04/12/2014   Has pea sized nodule at 6 o'clock tender and mobile, will get mammogram and [place] if needed  . Menorrhagia with regular cycle 09/19/2015  . Pelvic pain in female 09/19/2015  . Placenta previa 2012   Current pregnancy   . Urinary tract infection   . Vaginal discharge 07/01/2013    Patient Active Problem List   Diagnosis Date Noted  . S/P hysterectomy 10/10/2015  . Pelvic pain in female 09/19/2015  . Menorrhagia with regular cycle 09/19/2015  . Dyspareunia in female 09/19/2015  .  Dysmenorrhea 09/19/2015  . Left breast mass 04/12/2014  . Breast discharge 07/01/2013  . Vaginal discharge 07/01/2013  . Hernia of abdominal wall 01/04/2013  . Breast mass, right 10/20/2012  . Ectopic pregnancy     Past Surgical History:  Procedure Laterality Date  . ABDOMINAL HYSTERECTOMY N/A 10/10/2015   Procedure: HYSTERECTOMY ABDOMINAL;  Surgeon: Florian Buff, MD;  Location: [facility];  Service: Gynecology;  Laterality: N/A;  . APPENDECTOMY    . APPENDECTOMY    . BILATERAL SALPINGECTOMY Bilateral 10/10/2015   Procedure: BILATERAL SALPINGECTOMY;  Surgeon: Florian Buff, MD;  Location: [facility];  Service: Gynecology;  Laterality: Bilateral;  . BREAST BIOPSY Right   . CESAREAN SECTION  04/24/2011   Procedure: CESAREAN SECTION;  Surgeon: Florian Buff, MD;  Location: [facility];  Service: Gynecology;  Laterality: N/A;  Primary/Previa  . CHOLECYSTECTOMY    . CYSTOSCOPY W/ URETERAL STENT PLACEMENT Left 08/04/2014   Procedure: CYSTOSCOPY WITH LEFT  RETROGRADE PYELOGRAM/ LEFT URETERAL STENT PLACEMENT;  Surgeon: Festus Aloe, MD;  Location: [facility];  Service: Urology;  Laterality: Left;  . TONSILLECTOMY      OB History    Gravida Para Term Preterm AB Living   2 1 0 1 1 1    SAB TAB Ectopic Multiple Live Births   0 0 1 0 1       Home Medications  Prior to Admission medications   Medication Sig Start Date End Date Taking? Authorizing Provider  ALPRAZolam Duanne Moron) 1 MG tablet Take 0.5-1 mg by mouth 3 (three) times daily as needed for anxiety.    [provider]  fluticasone (FLONASE) 50 MCG/ACT nasal spray Place 1 spray into both nostrils 2 (two) times daily. 08/27/16   Valentina Shaggy, MD  levocetirizine Harlow Ohms) 5 MG tablet TAKE ONE TABLET BY MOUTH EVERY EVENING. 03/24/17   Valentina Shaggy, MD  naproxen (NAPROSYN) 500 MG tablet Take 1 tablet (500 mg total) by mouth 2 (two) times daily with a meal. 09/12/16   Triplett, Tammy, PA-C  omeprazole (PRILOSEC) 20 MG capsule Take  20 mg by mouth daily.  09/22/15   [provider]  polyethylene glycol (MIRALAX / GLYCOLAX) packet Take 17 g by mouth daily as needed. 08/16/17   Zigmund Gottron, NP  sertraline (ZOLOFT) 100 MG tablet Take 100 mg by mouth every morning.    [provider]  Vitamin D, Ergocalciferol, (DRISDOL) 50000 units CAPS capsule  08/22/16   [provider]    Family History Family History  Problem Relation Age of Onset  . Depression Mother   . Cancer Mother   . Seizures Mother   . Bipolar disorder Mother   . Asthma Mother   . Hypertension Father   . Asthma Father   . Heart disease Maternal Grandmother   . Cancer Maternal Grandmother        oral  . Hypertension Maternal Grandmother   . Other Maternal Grandmother        memory problems  . Stroke Paternal Grandfather   . Hypertension Paternal Grandfather   . Other Paternal Grandfather        brain tumors  . Heart disease Maternal Grandfather   . Pancreatitis Maternal Grandfather   . Cancer Paternal Grandmother        breast  . Heart disease Paternal Grandmother   . Kidney disease Paternal Grandmother        kidney failure  . Diabetes Paternal Grandmother   . Urticaria Son   . Allergic rhinitis Neg Hx   . Angioedema Neg Hx   . Eczema Neg Hx   . Immunodeficiency Neg Hx     Social History Social History   Tobacco Use  . Smoking status: Former Smoker    Years: 14.00    Types: Cigarettes    Last attempt to quit: 09/07/2009    Years since quitting: 7.9  . Smokeless tobacco: Never Used  Substance Use Topics  . Alcohol use: Yes    Comment: occ mixed drink  . Drug use: No     Allergies   Clarithromycin and Topamax [topiramate]   Review of Systems Review of Systems   Physical Exam Triage Vital Signs ED Triage Vitals [08/16/17 2035]  Enc Vitals Group     BP 140/89     Pulse Rate 90     Resp 18     Temp 98.4 F (36.9 C)     Temp Source Oral     SpO2 100 %     Weight      Height      Head  Circumference      Peak Flow      Pain Score      Pain Loc      Pain Edu?      Excl. in [place]?    No data found.  Updated Vital Signs BP 140/89 (  BP Location: Left Arm)   Pulse 90   Temp 98.4 F (36.9 C) (Oral)   Resp 18   LMP 09/26/2015 (Exact Date)   SpO2 100%   Visual Acuity Right Eye Distance:   Left Eye Distance:   Bilateral Distance:    Right Eye Near:   Left Eye Near:    Bilateral Near:     Physical Exam  Constitutional: She is oriented to person, place, and time. She appears well-developed and well-nourished. No distress.  Cardiovascular: Normal rate, regular rhythm and normal heart sounds.  Pulmonary/Chest: Effort normal and breath sounds normal.  Abdominal: Soft. Bowel sounds are normal. She exhibits distension. There is tenderness in the right lower quadrant, suprapubic area and left lower quadrant. There is no rigidity, no rebound, no guarding, no CVA tenderness, no tenderness at McBurney's point and negative Murphy's sign.  Very mild low abdomen pain on palpation  Neurological: She is alert and oriented to person, place, and time.  Skin: Skin is warm and dry.     UC Treatments / Results  Labs (all labs ordered are listed, but only abnormal results are displayed) Labs Reviewed  POCT URINALYSIS DIP (DEVICE)    EKG  EKG Interpretation None       Radiology No results found.  Procedures Procedures (including critical care time)  Medications Ordered in UC Medications  ketorolac (TORADOL) injection 60 mg (60 mg Intramuscular Given 08/16/17 2150)     Initial Impression / Assessment and Plan / UC Course  I have reviewed the triage vital signs and the nursing notes.  Pertinent labs & imaging results that were available during my care of the patient were reviewed by me and considered in my medical decision making (see chart for details).     Non toxic in appearance. Afebrile. No appendix, no uterus. Negative urine dip, without blood. No CVA  tenderness. Discussed with patient differentials of very small kidney stone vs constipation vs gas. She endorses she feels that this is likely gas/constipation related. toradol provided to see if this improves pain. Encouraged increased water intake. Will try miralax to promote large bowel movement to see if this improves symptoms. Return precautions provided. Encouraged follow up with pcp if symptoms persist. Patient verbalized understanding and agreeable to plan.    Final Clinical Impressions(s) / UC Diagnoses   Final diagnoses:  Lower abdominal pain    ED Discharge Orders        Ordered    polyethylene glycol (MIRALAX / GLYCOLAX) packet  Daily PRN     08/16/17 2147       Controlled Substance Prescriptions [place] Controlled Substance Registry consulted? Not Applicable   Zigmund Gottron, NP 08/16/17 2217

## 2017-08-18 DIAGNOSIS — Z6841 Body Mass Index (BMI) 40.0 and over, adult: Secondary | ICD-10-CM | POA: Diagnosis not present

## 2017-08-18 DIAGNOSIS — Z1389 Encounter for screening for other disorder: Secondary | ICD-10-CM | POA: Diagnosis not present

## 2017-08-18 DIAGNOSIS — R109 Unspecified abdominal pain: Secondary | ICD-10-CM | POA: Diagnosis not present

## 2017-08-18 DIAGNOSIS — R39198 Other difficulties with micturition: Secondary | ICD-10-CM | POA: Diagnosis not present

## 2017-08-18 DIAGNOSIS — K5900 Constipation, unspecified: Secondary | ICD-10-CM | POA: Diagnosis not present

## 2017-08-18 IMAGING — MR MR HEAD W/O CM
11 series · 48 of 48 positions shown · non-contrast
Comparison: CT head without contrast 11/21/2009.

CLINICAL DATA: Severe migraine headaches over the last year.
Dizziness and diplopia. Rule out neoplasm.

EXAM:
MRI HEAD WITHOUT CONTRAST
TECHNIQUE: Multiplanar, multiecho pulse sequences of the brain and surrounding
structures were obtained without intravenous contrast.

[Series 5: T1 · sagittal · 4.0mm · 0.75mm/px · 2 of 27 slices shown (1 of 2)]
[im 1/27]
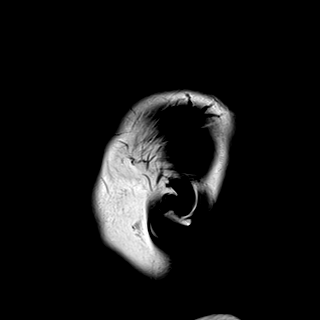
[im 27/27]
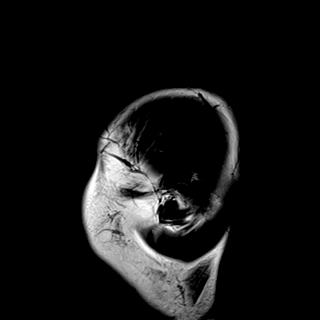

[Series 6: DWI · axial · 3.0mm · 1.44mm/px · z∈[-62,+73]mm · 6 of 84 slices shown (1 of 4)]
[im 1/84]
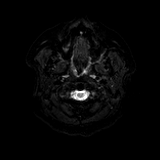
[im 17/84]
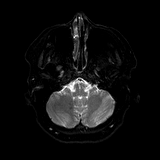
[im 34/84]
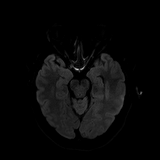
[im 50/84]
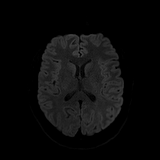
[im 67/84]
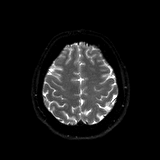
[im 84/84]
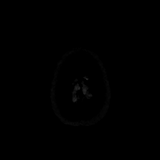

[Series 7: DWI · axial · 3.0mm · 1.44mm/px · z∈[-62,+73]mm · 3 of 42 slices shown (2 of 4)]
[im 1/42]
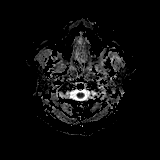
[im 21/42]
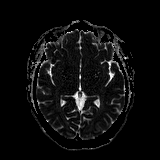
[im 42/42]
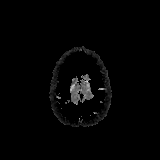

[Series 8: DWI · coronal · 5.0mm · 1.44mm/px · 4 of 60 slices shown (3 of 4)]
[im 1/60]
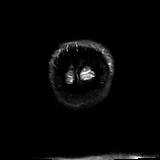
[im 20/60]
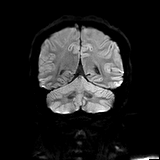
[im 40/60]
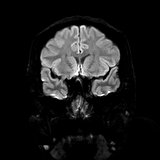
[im 60/60]
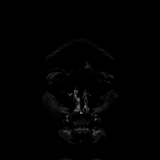

[Series 9: DWI · coronal · 5.0mm · 1.44mm/px · 2 of 30 slices shown (4 of 4)]
[im 1/30]
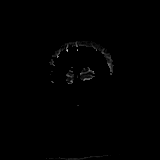
[im 30/30]
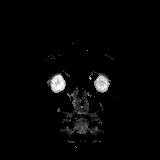

[Series 10: T2 · axial · 4.0mm · 0.36mm/px · z∈[-62,+72]mm · 2 of 27 slices shown (1 of 2)]
[im 1/27]
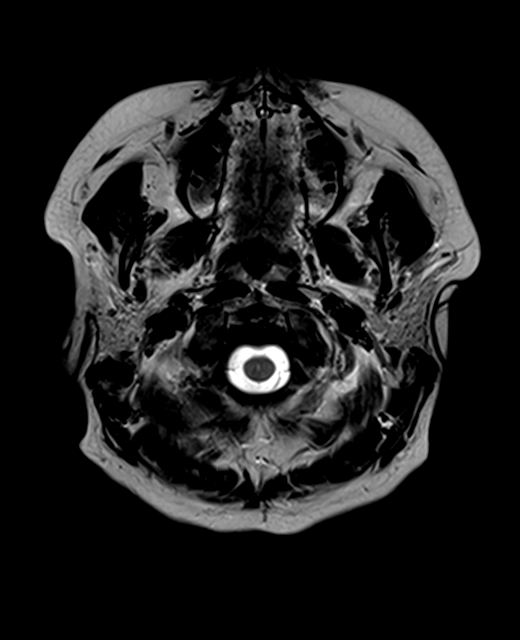
[im 27/27]
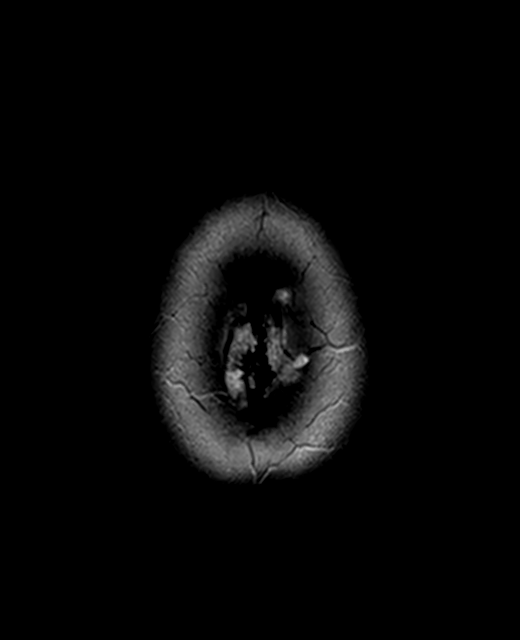

[Series 11: FLAIR · axial · 4.0mm · 0.72mm/px · z∈[-62,+73]mm · 2 of 27 slices shown]
[im 1/27]
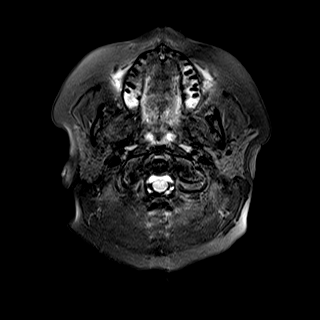
[im 27/27]
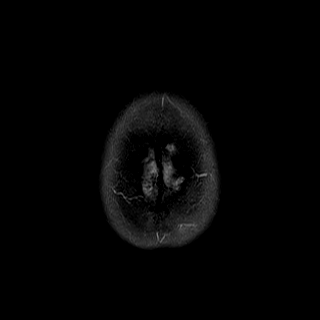

[Series 12: mip_images(sw) · axial · 12.0mm · 0.90mm/px · z∈[-61,+71]mm · 7 of 89 slices shown]
[im 1/89]
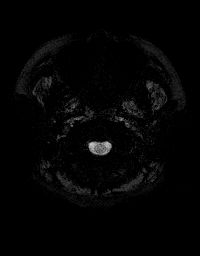
[im 15/89]
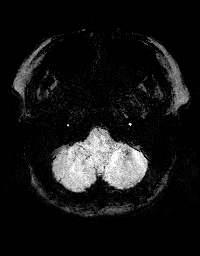
[im 30/89]
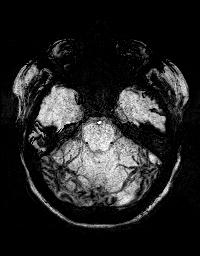
[im 45/89]
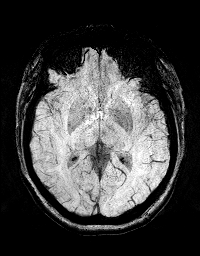
[im 59/89]
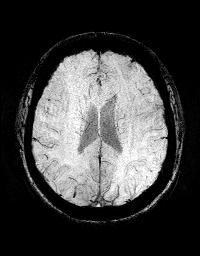
[im 74/89]
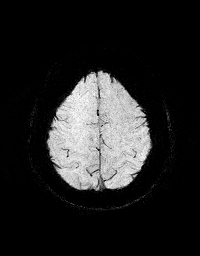
[im 89/89]
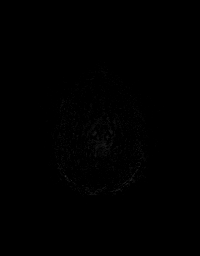

[Series 13: swi_images · axial · 1.5mm · 0.90mm/px · z∈[-66,+76]mm · 7 of 96 slices shown]
[im 1/96]
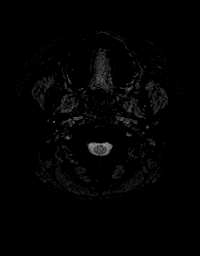
[im 16/96]
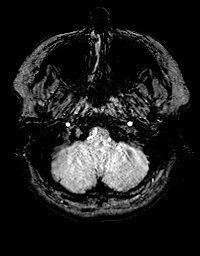
[im 32/96]
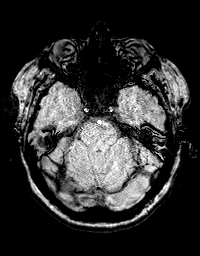
[im 48/96]
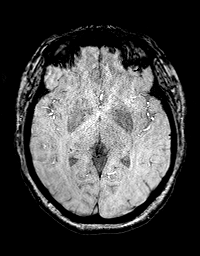
[im 64/96]
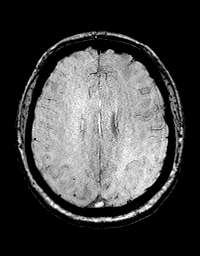
[im 80/96]
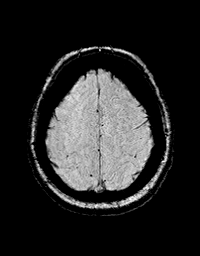
[im 96/96]
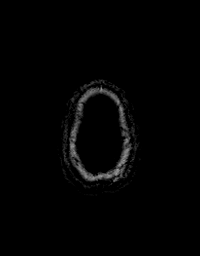

[Series 14: T1 · axial · 1.0mm · 0.90mm/px · z∈[-67,+76]mm · 11 of 144 slices shown (2 of 2)]
[im 1/144]
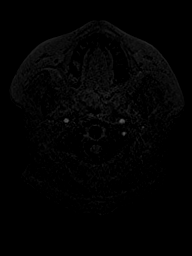
[im 15/144]
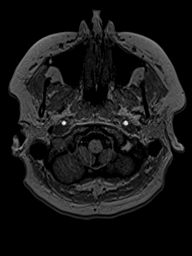
[im 29/144]
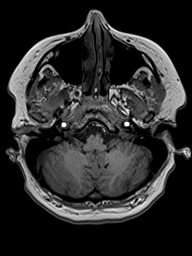
[im 43/144]
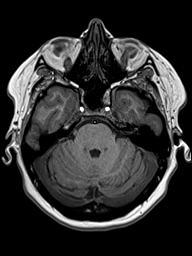
[im 58/144]
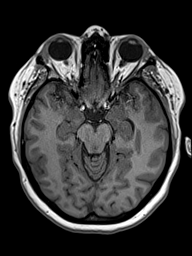
[im 72/144]
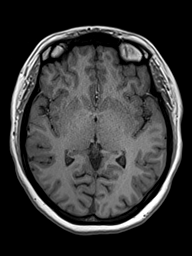
[im 86/144]
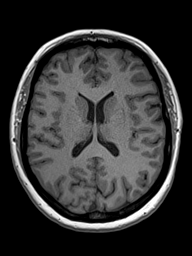
[im 101/144]
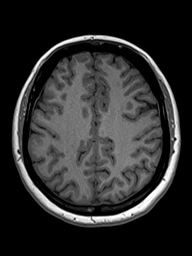
[im 115/144]
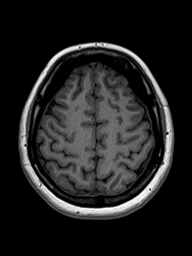
[im 129/144]
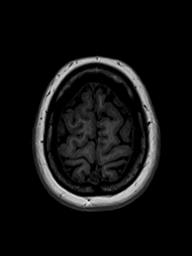
[im 144/144]
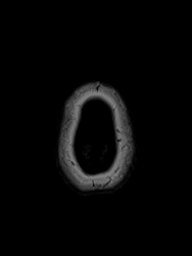

[Series 15: T2 · coronal · 4.5mm · 0.36mm/px · 2 of 30 slices shown (2 of 2)]
[im 1/30]
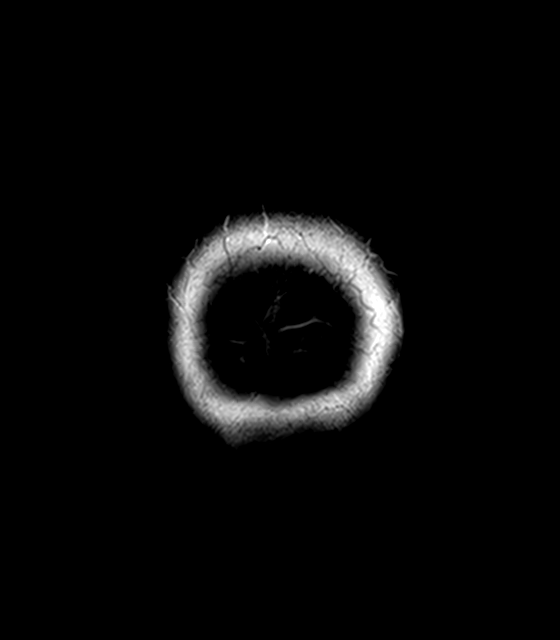
[im 30/30]
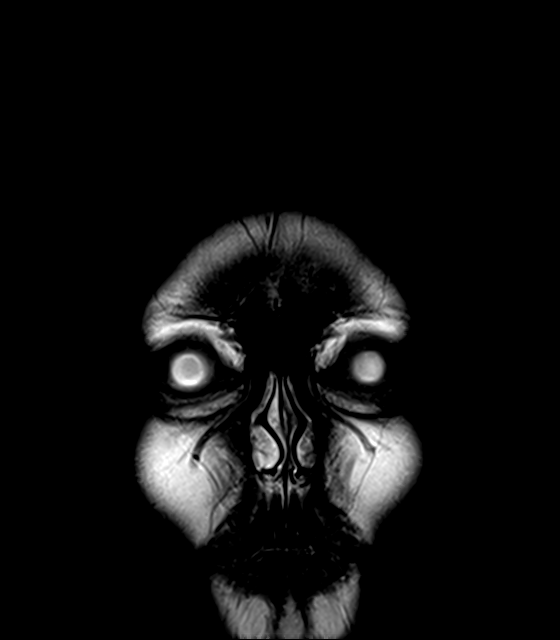

[48 of 48 positions shown; findings below may reference images not displayed]

FINDINGS: No acute infarct, hemorrhage, or mass lesion is present. The
ventricles are of normal size. Insert pass fluid

No significant white matter disease is present. The internal
auditory canals are within normal limits. The brainstem and
cerebellum are unremarkable.

Flow is present in the major intracranial arteries. Globes and
orbits are intact. Minimal mucosal thickening is present at the
floor of the maxillary sinuses bilaterally. The paranasal sinuses
and mastoid air cells are otherwise clear.

Skullbase is within normal limits. Midline structures are
unremarkable.
IMPRESSION: Negative MRI of the brain.

Minimal maxillary sinus disease.

## 2017-08-26 DIAGNOSIS — M546 Pain in thoracic spine: Secondary | ICD-10-CM | POA: Diagnosis not present

## 2017-08-26 DIAGNOSIS — M9901 Segmental and somatic dysfunction of cervical region: Secondary | ICD-10-CM | POA: Diagnosis not present

## 2017-08-26 DIAGNOSIS — M9902 Segmental and somatic dysfunction of thoracic region: Secondary | ICD-10-CM | POA: Diagnosis not present

## 2017-08-26 DIAGNOSIS — G44219 Episodic tension-type headache, not intractable: Secondary | ICD-10-CM | POA: Diagnosis not present

## 2017-09-01 DIAGNOSIS — K219 Gastro-esophageal reflux disease without esophagitis: Secondary | ICD-10-CM | POA: Diagnosis not present

## 2017-09-01 DIAGNOSIS — J9801 Acute bronchospasm: Secondary | ICD-10-CM | POA: Diagnosis not present

## 2017-09-01 DIAGNOSIS — E669 Obesity, unspecified: Secondary | ICD-10-CM | POA: Diagnosis not present

## 2017-09-01 DIAGNOSIS — J329 Chronic sinusitis, unspecified: Secondary | ICD-10-CM | POA: Diagnosis not present

## 2017-09-01 DIAGNOSIS — Z6841 Body Mass Index (BMI) 40.0 and over, adult: Secondary | ICD-10-CM | POA: Diagnosis not present

## 2017-09-23 DIAGNOSIS — G44219 Episodic tension-type headache, not intractable: Secondary | ICD-10-CM | POA: Diagnosis not present

## 2017-09-23 DIAGNOSIS — M546 Pain in thoracic spine: Secondary | ICD-10-CM | POA: Diagnosis not present

## 2017-09-23 DIAGNOSIS — M9901 Segmental and somatic dysfunction of cervical region: Secondary | ICD-10-CM | POA: Diagnosis not present

## 2017-09-23 DIAGNOSIS — M9902 Segmental and somatic dysfunction of thoracic region: Secondary | ICD-10-CM | POA: Diagnosis not present

## 2017-10-05 DIAGNOSIS — M9901 Segmental and somatic dysfunction of cervical region: Secondary | ICD-10-CM | POA: Diagnosis not present

## 2017-10-05 DIAGNOSIS — M9902 Segmental and somatic dysfunction of thoracic region: Secondary | ICD-10-CM | POA: Diagnosis not present

## 2017-10-05 DIAGNOSIS — G44219 Episodic tension-type headache, not intractable: Secondary | ICD-10-CM | POA: Diagnosis not present

## 2017-10-05 DIAGNOSIS — M546 Pain in thoracic spine: Secondary | ICD-10-CM | POA: Diagnosis not present

## 2017-10-08 DIAGNOSIS — Z6841 Body Mass Index (BMI) 40.0 and over, adult: Secondary | ICD-10-CM | POA: Diagnosis not present

## 2017-10-08 DIAGNOSIS — Z1389 Encounter for screening for other disorder: Secondary | ICD-10-CM | POA: Diagnosis not present

## 2017-10-08 DIAGNOSIS — G43009 Migraine without aura, not intractable, without status migrainosus: Secondary | ICD-10-CM | POA: Diagnosis not present

## 2017-10-08 DIAGNOSIS — D649 Anemia, unspecified: Secondary | ICD-10-CM | POA: Diagnosis not present

## 2017-10-08 DIAGNOSIS — J069 Acute upper respiratory infection, unspecified: Secondary | ICD-10-CM | POA: Diagnosis not present

## 2017-10-09 ENCOUNTER — Ambulatory Visit: Payer: BLUE CROSS/BLUE SHIELD | Admitting: Obstetrics & Gynecology

## 2017-10-09 ENCOUNTER — Encounter: Payer: Self-pay | Admitting: Obstetrics & Gynecology

## 2017-10-09 VITALS — BP 122/70 | HR 86 | Ht 67.0 in | Wt 303.0 lb

## 2017-10-09 DIAGNOSIS — Z01419 Encounter for gynecological examination (general) (routine) without abnormal findings: Secondary | ICD-10-CM | POA: Diagnosis not present

## 2017-10-09 NOTE — Progress Notes (Signed)
Subjective:     Julie [name] is a 42 y.o. female here for a routine exam.  Patient's last menstrual period was 09/26/2015 (exact date). [id number] Birth Control Method:  hysterectomy Menstrual Calendar(currently): amenorrheic  Current complaints: none.   Current acute medical issues:  none   Recent Gynecologic History Patient's last menstrual period was 09/26/2015 (exact date). Last Pap: ,   Last mammogram: n/a,    Past Medical History:  Diagnosis Date  . Anemia   . Anemia   . Anxiety   . Breast discharge 07/01/2013   Pt noticed discharge, none on exam today will check TSH and Prolactin  . Breast mass, right 10/20/2012  . Depression   . Dysmenorrhea 09/19/2015  . Dyspareunia in female 09/19/2015  . Ectopic pregnancy   . Eczema   . GERD (gastroesophageal reflux disease)    no meds  . Headache   . Hernia of abdominal wall 01/04/2013  . Left breast mass 04/12/2014   Has pea sized nodule at 6 o'clock tender and mobile, will get mammogram and [place] if needed  . Menorrhagia with regular cycle 09/19/2015  . Pelvic pain in female 09/19/2015  . Placenta previa 2012   Current pregnancy   . Urinary tract infection   . Vaginal discharge 07/01/2013    Past Surgical History:  Procedure Laterality Date  . ABDOMINAL HYSTERECTOMY N/A 10/10/2015   Procedure: HYSTERECTOMY ABDOMINAL;  Surgeon: Florian Buff, MD;  Location: [facility];  Service: Gynecology;  Laterality: N/A;  . APPENDECTOMY    . APPENDECTOMY    . BILATERAL SALPINGECTOMY Bilateral 10/10/2015   Procedure: BILATERAL SALPINGECTOMY;  Surgeon: Florian Buff, MD;  Location: [facility];  Service: Gynecology;  Laterality: Bilateral;  . BREAST BIOPSY Right   . CESAREAN SECTION  04/24/2011   Procedure: CESAREAN SECTION;  Surgeon: Florian Buff, MD;  Location: [facility];  Service: Gynecology;  Laterality: N/A;  Primary/Previa  . CHOLECYSTECTOMY    . CYSTOSCOPY W/ URETERAL STENT PLACEMENT Left 08/04/2014   Procedure: CYSTOSCOPY WITH LEFT  RETROGRADE  PYELOGRAM/ LEFT URETERAL STENT PLACEMENT;  Surgeon: Festus Aloe, MD;  Location: [facility];  Service: Urology;  Laterality: Left;  . TONSILLECTOMY      OB History    Gravida  2   Para  1   Term  0   Preterm  1   AB  1   Living  1     SAB  0   TAB  0   Ectopic  1   Multiple  0   Live Births  1           Social History   Socioeconomic History  . Marital status: Married    Spouse name: Not on file  . Number of children: Not on file  . Years of education: Not on file  . Highest education level: Not on file  Occupational History  . Not on file  Social Needs  . Financial resource strain: Not on file  . Food insecurity:    Worry: Not on file    Inability: Not on file  . Transportation needs:    Medical: Not on file    Non-medical: Not on file  Tobacco Use  . Smoking status: Former Smoker    Years: 14.00    Types: Cigarettes    Last attempt to quit: 09/07/2009    Years since quitting: 8.0  . Smokeless tobacco: Never Used  Substance and Sexual Activity  . Alcohol use: Yes  Comment: occ mixed drink  . Drug use: No  . Sexual activity: Yes    Birth control/protection: Surgical    Comment: hyst  Lifestyle  . Physical activity:    Days per week: Not on file    Minutes per session: Not on file  . Stress: Not on file  Relationships  . Social connections:    Talks on phone: Not on file    Gets together: Not on file    Attends religious service: Not on file    Active member of club or organization: Not on file    Attends meetings of clubs or organizations: Not on file    Relationship status: Not on file  Other Topics Concern  . Not on file  Social History Narrative  . Not on file    Family History  Problem Relation Age of Onset  . Depression Mother   . Cancer Mother   . Seizures Mother   . Bipolar disorder Mother   . Asthma Mother   . Hypertension Father   . Asthma Father   . Heart disease Maternal Grandmother   . Cancer Maternal  Grandmother        oral; lymph nodes  . Hypertension Maternal Grandmother   . Other Maternal Grandmother        memory problems  . Stroke Paternal Grandfather   . Hypertension Paternal Grandfather   . Other Paternal Grandfather        brain tumors  . Heart disease Maternal Grandfather   . Pancreatitis Maternal Grandfather   . Cancer Paternal Grandmother        breast  . Heart disease Paternal Grandmother   . Kidney disease Paternal Grandmother        kidney failure  . Diabetes Paternal Grandmother   . Urticaria Son   . Allergic rhinitis Neg Hx   . Angioedema Neg Hx   . Eczema Neg Hx   . Immunodeficiency Neg Hx      Current Outpatient Medications:  .  ALPRAZolam (XANAX) 1 MG tablet, Take 0.5-1 mg by mouth 3 (three) times daily as needed for anxiety., Disp: , Rfl:  .  levocetirizine (XYZAL) 5 MG tablet, TAKE ONE TABLET BY MOUTH EVERY EVENING., Disp: 30 tablet, Rfl: 1 .  omeprazole (PRILOSEC) 20 MG capsule, Take 20 mg by mouth daily. , Disp: , Rfl: 4 .  sertraline (ZOLOFT) 100 MG tablet, Take 100 mg by mouth every morning., Disp: , Rfl:   Review of Systems  Review of Systems  Constitutional: Negative for fever, chills, weight loss, malaise/fatigue and diaphoresis.  HENT: Negative for hearing loss, ear pain, nosebleeds, congestion, sore throat, neck pain, tinnitus and ear discharge.   Eyes: Negative for blurred vision, double vision, photophobia, pain, discharge and redness.  Respiratory: Negative for cough, hemoptysis, sputum production, shortness of breath, wheezing and stridor.   Cardiovascular: Negative for chest pain, palpitations, orthopnea, claudication, leg swelling and PND.  Gastrointestinal: negative for abdominal pain. Negative for heartburn, nausea, vomiting, diarrhea, constipation, blood in stool and melena.  Genitourinary: Negative for dysuria, urgency, frequency, hematuria and flank pain.  Musculoskeletal: Negative for myalgias, back pain, joint pain and falls.   Skin: Negative for itching and rash.  Neurological: Negative for dizziness, tingling, tremors, sensory change, speech change, focal weakness, seizures, loss of consciousness, weakness and headaches.  Endo/Heme/Allergies: Negative for environmental allergies and polydipsia. Does not bruise/bleed easily.  Psychiatric/Behavioral: Negative for depression, suicidal ideas, hallucinations, memory loss and substance abuse. The patient is not  nervous/anxious and does not have insomnia.        Objective:  Blood pressure 122/70, pulse 86, height 5\' 7"  (1.702 m), weight (!) 303 lb (137.4 kg), last menstrual period 09/26/2015.   Physical Exam  Vitals reviewed. Constitutional: She is oriented to person, place, and time. She appears well-developed and well-nourished.  HENT:  Head: Normocephalic and atraumatic.        Right Ear: External ear normal.  Left Ear: External ear normal.  Nose: Nose normal.  Mouth/Throat: Oropharynx is clear and moist.  Eyes: Conjunctivae and EOM are normal. Pupils are equal, round, and reactive to light. Right eye exhibits no discharge. Left eye exhibits no discharge. No scleral icterus.  Neck: Normal range of motion. Neck supple. No tracheal deviation present. No thyromegaly present.  Cardiovascular: Normal rate, regular rhythm, normal heart sounds and intact distal pulses.  Exam reveals no gallop and no friction rub.   No murmur heard. Respiratory: Effort normal and breath sounds normal. No respiratory distress. She has no wheezes. She has no rales. She exhibits no tenderness.  GI: Soft. Bowel sounds are normal. She exhibits no distension and no mass. There is no tenderness. There is no rebound and no guarding.  Genitourinary:  Breasts no masses skin changes or nipple changes bilaterally      Vulva is normal without lesions Vagina is pink moist without discharge Cervix normal in appearance and pap is done Uterus is normal size shape and contour Adnexa is negative with  normal sized ovaries   Musculoskeletal: Normal range of motion. She exhibits no edema and no tenderness.  Neurological: She is alert and oriented to person, place, and time. She has normal reflexes. She displays normal reflexes. No cranial nerve deficit. She exhibits normal muscle tone. Coordination normal.  Skin: Skin is warm and dry. No rash noted. No erythema. No pallor.  Psychiatric: She has a normal mood and affect. Her behavior is normal. Judgment and thought content normal.       Medications Ordered at today's visit: No orders of the defined types were placed in this encounter.   Other orders placed at today's visit: No orders of the defined types were placed in this encounter.     Assessment:    Healthy female exam.    Plan:    Mammogram ordered. Follow up in: 2 years.   After age 52  Return in about 2 years (around 10/10/2019) for yearly, with Dr Elonda Husky.

## 2017-10-21 DIAGNOSIS — M546 Pain in thoracic spine: Secondary | ICD-10-CM | POA: Diagnosis not present

## 2017-10-21 DIAGNOSIS — M9901 Segmental and somatic dysfunction of cervical region: Secondary | ICD-10-CM | POA: Diagnosis not present

## 2017-10-21 DIAGNOSIS — M9902 Segmental and somatic dysfunction of thoracic region: Secondary | ICD-10-CM | POA: Diagnosis not present

## 2017-10-21 DIAGNOSIS — G44219 Episodic tension-type headache, not intractable: Secondary | ICD-10-CM | POA: Diagnosis not present

## 2017-10-24 DIAGNOSIS — J019 Acute sinusitis, unspecified: Secondary | ICD-10-CM | POA: Diagnosis not present

## 2017-10-24 DIAGNOSIS — J060 Acute laryngopharyngitis: Secondary | ICD-10-CM | POA: Diagnosis not present

## 2017-10-24 DIAGNOSIS — Z6841 Body Mass Index (BMI) 40.0 and over, adult: Secondary | ICD-10-CM | POA: Diagnosis not present

## 2017-10-26 ENCOUNTER — Ambulatory Visit (HOSPITAL_COMMUNITY)
Admission: RE | Admit: 2017-10-26 | Discharge: 2017-10-26 | Disposition: A | Discharge status: Home / Self Care | Payer: BLUE CROSS/BLUE SHIELD | Source: Ambulatory Visit | Attending: Family Medicine | Admitting: Family Medicine

## 2017-10-26 ENCOUNTER — Other Ambulatory Visit (HOSPITAL_COMMUNITY): Payer: Self-pay | Admitting: Family Medicine

## 2017-10-26 DIAGNOSIS — Z6841 Body Mass Index (BMI) 40.0 and over, adult: Secondary | ICD-10-CM | POA: Diagnosis not present

## 2017-10-26 DIAGNOSIS — J069 Acute upper respiratory infection, unspecified: Secondary | ICD-10-CM | POA: Insufficient documentation

## 2017-10-26 DIAGNOSIS — J45909 Unspecified asthma, uncomplicated: Secondary | ICD-10-CM | POA: Insufficient documentation

## 2017-10-26 DIAGNOSIS — Z1389 Encounter for screening for other disorder: Secondary | ICD-10-CM | POA: Diagnosis not present

## 2017-10-26 DIAGNOSIS — R05 Cough: Secondary | ICD-10-CM | POA: Diagnosis not present

## 2017-11-11 DIAGNOSIS — M9901 Segmental and somatic dysfunction of cervical region: Secondary | ICD-10-CM | POA: Diagnosis not present

## 2017-11-11 DIAGNOSIS — M546 Pain in thoracic spine: Secondary | ICD-10-CM | POA: Diagnosis not present

## 2017-11-11 DIAGNOSIS — G44219 Episodic tension-type headache, not intractable: Secondary | ICD-10-CM | POA: Diagnosis not present

## 2017-11-11 DIAGNOSIS — M9902 Segmental and somatic dysfunction of thoracic region: Secondary | ICD-10-CM | POA: Diagnosis not present

## 2017-11-12 DIAGNOSIS — G43109 Migraine with aura, not intractable, without status migrainosus: Secondary | ICD-10-CM | POA: Diagnosis not present

## 2017-11-12 DIAGNOSIS — Z6841 Body Mass Index (BMI) 40.0 and over, adult: Secondary | ICD-10-CM | POA: Diagnosis not present

## 2017-11-12 DIAGNOSIS — Z1389 Encounter for screening for other disorder: Secondary | ICD-10-CM | POA: Diagnosis not present

## 2017-11-26 DIAGNOSIS — Z6841 Body Mass Index (BMI) 40.0 and over, adult: Secondary | ICD-10-CM | POA: Diagnosis not present

## 2017-11-26 DIAGNOSIS — J019 Acute sinusitis, unspecified: Secondary | ICD-10-CM | POA: Diagnosis not present

## 2017-11-26 DIAGNOSIS — J301 Allergic rhinitis due to pollen: Secondary | ICD-10-CM | POA: Diagnosis not present

## 2017-11-26 DIAGNOSIS — Z1389 Encounter for screening for other disorder: Secondary | ICD-10-CM | POA: Diagnosis not present

## 2017-12-05 ENCOUNTER — Other Ambulatory Visit: Payer: Self-pay

## 2017-12-05 ENCOUNTER — Emergency Department (HOSPITAL_COMMUNITY)
Admission: EM | Admit: 2017-12-05 | Discharge: 2017-12-05 | Disposition: A | Discharge status: Home / Self Care | Payer: BLUE CROSS/BLUE SHIELD | Attending: Emergency Medicine | Admitting: Emergency Medicine

## 2017-12-05 ENCOUNTER — Emergency Department (HOSPITAL_COMMUNITY): Payer: BLUE CROSS/BLUE SHIELD

## 2017-12-05 ENCOUNTER — Encounter (HOSPITAL_COMMUNITY): Payer: Self-pay | Admitting: Emergency Medicine

## 2017-12-05 DIAGNOSIS — R102 Pelvic and perineal pain: Secondary | ICD-10-CM | POA: Diagnosis not present

## 2017-12-05 DIAGNOSIS — D3912 Neoplasm of uncertain behavior of left ovary: Secondary | ICD-10-CM | POA: Insufficient documentation

## 2017-12-05 DIAGNOSIS — Z87891 Personal history of nicotine dependence: Secondary | ICD-10-CM | POA: Insufficient documentation

## 2017-12-05 DIAGNOSIS — R1907 Generalized intra-abdominal and pelvic swelling, mass and lump: Secondary | ICD-10-CM | POA: Diagnosis not present

## 2017-12-05 DIAGNOSIS — R1030 Lower abdominal pain, unspecified: Secondary | ICD-10-CM | POA: Diagnosis not present

## 2017-12-05 DIAGNOSIS — Z79899 Other long term (current) drug therapy: Secondary | ICD-10-CM | POA: Insufficient documentation

## 2017-12-05 DIAGNOSIS — R109 Unspecified abdominal pain: Secondary | ICD-10-CM

## 2017-12-05 DIAGNOSIS — R1084 Generalized abdominal pain: Secondary | ICD-10-CM | POA: Diagnosis not present

## 2017-12-05 DIAGNOSIS — N838 Other noninflammatory disorders of ovary, fallopian tube and broad ligament: Secondary | ICD-10-CM

## 2017-12-05 DIAGNOSIS — N83202 Unspecified ovarian cyst, left side: Secondary | ICD-10-CM | POA: Diagnosis not present

## 2017-12-05 HISTORY — DX: Calculus of kidney: N20.0

## 2017-12-05 LAB — URINALYSIS, ROUTINE W REFLEX MICROSCOPIC
Bacteria, UA: NONE SEEN
Bilirubin Urine: NEGATIVE
Glucose, UA: NEGATIVE mg/dL
Ketones, ur: NEGATIVE mg/dL
Leukocytes, UA: NEGATIVE
Nitrite: NEGATIVE
Protein, ur: NEGATIVE mg/dL
Specific Gravity, Urine: 1.02 (ref 1.005–1.030)
pH: 7 (ref 5.0–8.0)

## 2017-12-05 LAB — CBC WITH DIFFERENTIAL/PLATELET
Basophils Absolute: 0 10*3/uL (ref 0.0–0.1)
Basophils Relative: 0 %
Eosinophils Absolute: 0.1 10*3/uL (ref 0.0–0.7)
Eosinophils Relative: 1 %
HCT: 40.2 % (ref 36.0–46.0)
Hemoglobin: 13.2 g/dL (ref 12.0–15.0)
Lymphocytes Relative: 27 %
Lymphs Abs: 2.4 10*3/uL (ref 0.7–4.0)
MCH: 26.7 pg (ref 26.0–34.0)
MCHC: 32.8 g/dL (ref 30.0–36.0)
MCV: 81.4 fL (ref 78.0–100.0)
Monocytes Absolute: 0.6 10*3/uL (ref 0.1–1.0)
Monocytes Relative: 7 %
Neutro Abs: 5.8 10*3/uL (ref 1.7–7.7)
Neutrophils Relative %: 65 %
Platelets: 203 10*3/uL (ref 150–400)
RBC: 4.94 MIL/uL (ref 3.87–5.11)
RDW: 14.4 % (ref 11.5–15.5)
WBC: 8.9 10*3/uL (ref 4.0–10.5)

## 2017-12-05 LAB — COMPREHENSIVE METABOLIC PANEL
ALT: 17 U/L (ref 0–44)
AST: 15 U/L (ref 15–41)
Albumin: 3.9 g/dL (ref 3.5–5.0)
Alkaline Phosphatase: 57 U/L (ref 38–126)
Anion gap: 7 (ref 5–15)
BUN: 13 mg/dL (ref 6–20)
CO2: 26 mmol/L (ref 22–32)
Calcium: 8.5 mg/dL — ABNORMAL LOW (ref 8.9–10.3)
Chloride: 105 mmol/L (ref 98–111)
Creatinine, Ser: 0.61 mg/dL (ref 0.44–1.00)
GFR calc Af Amer: >60 mL/min (ref >60)
GFR calc non Af Amer: >60 mL/min (ref >60)
Glucose, Bld: 92 mg/dL (ref 70–99)
Potassium: 3.8 mmol/L (ref 3.5–5.1)
Sodium: 138 mmol/L (ref 135–145)
Total Bilirubin: 0.3 mg/dL (ref 0.3–1.2)
Total Protein: 7.3 g/dL (ref 6.5–8.1)

## 2017-12-05 LAB — LIPASE, BLOOD: Lipase: 28 U/L (ref 11–51)

## 2017-12-05 MED ORDER — OXYCODONE-ACETAMINOPHEN 5-325 MG PO TABS: ORAL_TABLET | ORAL | 0 refills | Status: DC

## 2017-12-05 MED ORDER — OXYCODONE-ACETAMINOPHEN 5-325 MG PO TABS
2.0000 | ORAL_TABLET | Freq: Once | ORAL | Status: AC
  Administered 2017-12-05: 2 via ORAL
  Filled 2017-12-05: qty 2

## 2017-12-05 MED ORDER — ONDANSETRON HCL 4 MG/2ML IJ SOLN
4.0000 mg | INTRAMUSCULAR | Status: DC | PRN
  Administered 2017-12-05: 4 mg via INTRAVENOUS
  Filled 2017-12-05: qty 2

## 2017-12-05 MED ORDER — ONDANSETRON 4 MG PO TBDP: 4.0000 mg | ORAL_TABLET | Freq: Three times a day (TID) | ORAL | 0 refills | Status: DC | PRN

## 2017-12-05 MED ORDER — MORPHINE SULFATE (PF) 4 MG/ML IV SOLN
4.0000 mg | INTRAVENOUS | Status: DC | PRN
  Administered 2017-12-05: 4 mg via INTRAVENOUS
  Filled 2017-12-05: qty 1

## 2017-12-05 NOTE — ED Triage Notes (Signed)
Pt states she was seen at urgent care today and sent here for CT scan of abd. States urinalysis was clear. Has been having the pain since last night, last BM today.

## 2017-12-05 NOTE — ED Provider Notes (Signed)
[facility] EMERGENCY DEPARTMENT Provider Note   CSN: [id number] Arrival date & time: 12/05/17  1537     History   Chief Complaint Chief Complaint  Patient presents with  . Abdominal Pain    HPI Julie [name] is a 42 y.o. female.  HPI  Pt was seen at 1555.  Per pt, c/o sudden onset and persistence of constant left sided low back and abd "pain" that began yesterday.  Pt describes the pain as "sharp," and "pressure when my bladder gets full."  Has been associated with nausea and several loose stools. Pt was evaluated at [facility], then sent to the ED "for a CT scan to see if I had another kidney stone or diverticulitis."  Denies vaginal bleeding/discharge, no dysuria/hematuria, no vomiting, no black or blood in stools, no CP/SOB, no fevers, no rash.    Past Medical History:  Diagnosis Date  . Anemia   . Anemia   . Anxiety   . Breast discharge 07/01/2013   Pt noticed discharge, none on exam today will check TSH and Prolactin  . Breast mass, right 10/20/2012  . Depression   . Dysmenorrhea 09/19/2015  . Dyspareunia in female 09/19/2015  . Ectopic pregnancy   . Eczema   . GERD (gastroesophageal reflux disease)    no meds  . Headache   . Hernia of abdominal wall 01/04/2013  . Kidney stone   . Left breast mass 04/12/2014   Has pea sized nodule at 6 o'clock tender and mobile, will get mammogram and [place] if needed  . Menorrhagia with regular cycle 09/19/2015  . Pelvic pain in female 09/19/2015  . Placenta previa 2012   Current pregnancy   . Urinary tract infection   . Vaginal discharge 07/01/2013    Patient Active Problem List   Diagnosis Date Noted  . S/P hysterectomy 10/10/2015  . Pelvic pain in female 09/19/2015  . Menorrhagia with regular cycle 09/19/2015  . Dyspareunia in female 09/19/2015  . Dysmenorrhea 09/19/2015  . Left breast mass 04/12/2014  . Breast discharge 07/01/2013  . Vaginal discharge 07/01/2013  . Hernia of abdominal wall 01/04/2013  . Breast mass, right  10/20/2012  . Ectopic pregnancy     Past Surgical History:  Procedure Laterality Date  . ABDOMINAL HYSTERECTOMY N/A 10/10/2015   Procedure: HYSTERECTOMY ABDOMINAL;  Surgeon: Florian Buff, MD;  Location: [facility];  Service: Gynecology;  Laterality: N/A;  . APPENDECTOMY    . APPENDECTOMY    . BILATERAL SALPINGECTOMY Bilateral 10/10/2015   Procedure: BILATERAL SALPINGECTOMY;  Surgeon: Florian Buff, MD;  Location: [facility];  Service: Gynecology;  Laterality: Bilateral;  . BREAST BIOPSY Right   . CESAREAN SECTION  04/24/2011   Procedure: CESAREAN SECTION;  Surgeon: Florian Buff, MD;  Location: [place] [facility];  Service: Gynecology;  Laterality: N/A;  Primary/Previa  . CHOLECYSTECTOMY    . CYSTOSCOPY W/ URETERAL STENT PLACEMENT Left 08/04/2014   Procedure: CYSTOSCOPY WITH LEFT  RETROGRADE PYELOGRAM/ LEFT URETERAL STENT PLACEMENT;  Surgeon: Festus Aloe, MD;  Location: [facility];  Service: Urology;  Laterality: Left;  . TONSILLECTOMY       OB History    Gravida  2   Para  1   Term  0   Preterm  1   AB  1   Living  1     SAB  0   TAB  0   Ectopic  1   Multiple  0   Live Births  1  Home Medications    Prior to Admission medications   Medication Sig Start Date End Date Taking? Authorizing Provider  ALPRAZolam Duanne Moron) 1 MG tablet Take 0.5-1 mg by mouth 3 (three) times daily as needed for anxiety.    [provider]  levocetirizine (XYZAL) 5 MG tablet TAKE ONE TABLET BY MOUTH EVERY EVENING. 03/24/17   Valentina Shaggy, MD  omeprazole (PRILOSEC) 20 MG capsule Take 20 mg by mouth daily.  09/22/15   [provider]  sertraline (ZOLOFT) 100 MG tablet Take 100 mg by mouth every morning.    [provider]    Family History Family History  Problem Relation Age of Onset  . Depression Mother   . Cancer Mother   . Seizures Mother   . Bipolar disorder Mother   . Asthma Mother   . Hypertension Father   . Asthma Father   . Heart disease  Maternal Grandmother   . Cancer Maternal Grandmother        oral; lymph nodes  . Hypertension Maternal Grandmother   . Other Maternal Grandmother        memory problems  . Stroke Paternal Grandfather   . Hypertension Paternal Grandfather   . Other Paternal Grandfather        brain tumors  . Heart disease Maternal Grandfather   . Pancreatitis Maternal Grandfather   . Cancer Paternal Grandmother        breast  . Heart disease Paternal Grandmother   . Kidney disease Paternal Grandmother        kidney failure  . Diabetes Paternal Grandmother   . Urticaria Son   . Allergic rhinitis Neg Hx   . Angioedema Neg Hx   . Eczema Neg Hx   . Immunodeficiency Neg Hx     Social History Social History   Tobacco Use  . Smoking status: Former Smoker    Years: 14.00    Types: Cigarettes    Last attempt to quit: 09/07/2009    Years since quitting: 8.2  . Smokeless tobacco: Never Used  Substance Use Topics  . Alcohol use: Yes    Comment: occ mixed drink  . Drug use: No     Allergies   Clarithromycin and Topamax [topiramate]   Review of Systems Review of Systems ROS: Statement: All systems negative except as marked or noted in the HPI; Constitutional: Negative for fever and chills. ; ; Eyes: Negative for eye pain, redness and discharge. ; ; ENMT: Negative for ear pain, hoarseness, nasal congestion, sinus pressure and sore throat. ; ; Cardiovascular: Negative for chest pain, palpitations, diaphoresis, dyspnea and peripheral edema. ; ; Respiratory: Negative for cough, wheezing and stridor. ; ; Gastrointestinal: +nausea, diarrhea, abd pain. Negative for vomiting, blood in stool, hematemesis, jaundice and rectal bleeding. . ; ; Genitourinary: Negative for dysuria, flank pain and hematuria. ; ; GYN:  No pelvic pain, no vaginal bleeding, no vaginal discharge, no vulvar pain. ;; Musculoskeletal: +LBP. Negative for neck pain. Negative for swelling and trauma.; ; Skin: Negative for pruritus, rash,  abrasions, blisters, bruising and skin lesion.; ; Neuro: Negative for headache, lightheadedness and neck stiffness. Negative for weakness, altered level of consciousness, altered mental status, extremity weakness, paresthesias, involuntary movement, seizure and syncope.       Physical Exam Updated Vital Signs BP (!) 120/93 (BP Location: Right Arm)   Pulse 93   Temp 98.6 F (37 C) (Oral)   Resp 14   Ht 5\' 7"  (1.702 m)   Wt Marland Kitchen)  139.7 kg (308 lb)   LMP 09/26/2015 (Exact Date)   SpO2 100%   BMI 48.24 kg/m   Physical Exam 1600: Physical examination:  Nursing notes reviewed; Vital signs and O2 SAT reviewed;  Constitutional: Well developed, Well nourished, Well hydrated, In no acute distress; Head:  Normocephalic, atraumatic; Eyes: EOMI, PERRL, No scleral icterus; ENMT: Mouth and pharynx normal, Mucous membranes moist; Neck: Supple, Full range of motion, No lymphadenopathy; Cardiovascular: Regular rate and rhythm, No gallop; Respiratory: Breath sounds clear & equal bilaterally, No wheezes.  Speaking full sentences with ease, Normal respiratory effort/excursion; Chest: Nontender, Movement normal; Abdomen: Soft, +LLQ tenderness to palp. No rebound or guarding. Nondistended, Normal bowel sounds; Genitourinary: No CVA tenderness; Spine:  No midline CS, TS, LS tenderness. No rash.;; Extremities: Peripheral pulses normal, No tenderness, No edema, No calf edema or asymmetry.; Neuro: AA&Ox3, Major CN grossly intact.  Speech clear. No gross focal motor or sensory deficits in extremities.; Skin: Color normal, Warm, Dry.   ED Treatments / Results  Labs (all labs ordered are listed, but only abnormal results are displayed)   EKG None  Radiology   Procedures Procedures (including critical care time)  Medications Ordered in ED Medications  ondansetron (ZOFRAN) injection 4 mg (has no administration in time range)  morphine 4 MG/ML injection 4 mg (has no administration in time range)      Initial Impression / Assessment and Plan / ED Course  I have reviewed the triage vital signs and the nursing notes.  Pertinent labs & imaging results that were available during my care of the patient were reviewed by me and considered in my medical decision making (see chart for details).  MDM Reviewed: previous chart, nursing note and vitals Reviewed previous: labs Interpretation: labs, CT scan and ultrasound    Results for orders placed or performed during the hospital encounter of 12/05/17  Lipase, blood  Result Value Ref Range   Lipase 28 11 - 51 U/L  Comprehensive metabolic panel  Result Value Ref Range   Sodium 138 135 - 145 mmol/L   Potassium 3.8 3.5 - 5.1 mmol/L   Chloride 105 98 - 111 mmol/L   CO2 26 22 - 32 mmol/L   Glucose, Bld 92 70 - 99 mg/dL   BUN 13 6 - 20 mg/dL   Creatinine, Ser 0.61 0.44 - 1.00 mg/dL   Calcium 8.5 (L) 8.9 - 10.3 mg/dL   Total Protein 7.3 6.5 - 8.1 g/dL   Albumin 3.9 3.5 - 5.0 g/dL   AST 15 15 - 41 U/L   ALT 17 0 - 44 U/L   Alkaline Phosphatase 57 38 - 126 U/L   Total Bilirubin 0.3 0.3 - 1.2 mg/dL   GFR calc non Af Amer >60 >60 mL/min   GFR calc Af Amer >60 >60 mL/min   Anion gap 7 5 - 15  Urinalysis, Routine w reflex microscopic  Result Value Ref Range   Color, Urine YELLOW YELLOW   APPearance CLEAR CLEAR   Specific Gravity, Urine 1.020 1.005 - 1.030   pH 7.0 5.0 - 8.0   Glucose, UA NEGATIVE NEGATIVE mg/dL   Hgb urine dipstick SMALL (A) NEGATIVE   Bilirubin Urine NEGATIVE NEGATIVE   Ketones, ur NEGATIVE NEGATIVE mg/dL   Protein, ur NEGATIVE NEGATIVE mg/dL   Nitrite NEGATIVE NEGATIVE   Leukocytes, UA NEGATIVE NEGATIVE   RBC / HPF 0-5 0 - 5 RBC/hpf   WBC, UA 0-5 0 - 5 WBC/hpf   Bacteria, UA NONE SEEN NONE  SEEN   Squamous Epithelial / LPF 0-5 0 - 5   Mucus PRESENT   CBC with Differential/Platelet  Result Value Ref Range   WBC 8.9 4.0 - 10.5 K/uL   RBC 4.94 3.87 - 5.11 MIL/uL   Hemoglobin 13.2 12.0 - 15.0 g/dL   HCT 40.2  36.0 - 46.0 %   MCV 81.4 78.0 - 100.0 fL   MCH 26.7 26.0 - 34.0 pg   MCHC 32.8 30.0 - 36.0 g/dL   RDW 14.4 11.5 - 15.5 %   Platelets 203 150 - 400 K/uL   Neutrophils Relative % 65 %   Neutro Abs 5.8 1.7 - 7.7 K/uL   Lymphocytes Relative 27 %   Lymphs Abs 2.4 0.7 - 4.0 K/uL   Monocytes Relative 7 %   Monocytes Absolute 0.6 0.1 - 1.0 K/uL   Eosinophils Relative 1 %   Eosinophils Absolute 0.1 0.0 - 0.7 K/uL   Basophils Relative 0 %   Basophils Absolute 0.0 0.0 - 0.1 K/uL   US Pelvis Complete Result Date: 12/05/2017 CLINICAL DATA:  Left flank pain. Left mass seen on CT from earlier today. EXAM: TRANSABDOMINAL AND TRANSVAGINAL ULTRASOUND OF PELVIS DOPPLER ULTRASOUND OF OVARIES TECHNIQUE: Both transabdominal and transvaginal ultrasound examinations of the pelvis were performed. Transabdominal technique was performed for global imaging of the pelvis including uterus, ovaries, adnexal regions, and pelvic cul-de-sac. It was necessary to proceed with endovaginal exam following the transabdominal exam to visualize the ovaries. Color and duplex Doppler ultrasound was utilized to evaluate blood flow to the ovaries. COMPARISON:  CT scan December 05, 2017.  CT scan June 21, 2014. FINDINGS: Uterus Surgically absent. Right ovary Not visualized. Left ovary There is a solid-appearing mass in the left adnexum measuring 5.1 x 5.2 x 4.8 cm. This corresponds to the CT scan finding. No follicles seen within this mass. A separate left ovary is not visualized. Pulsed Doppler evaluation of the left ovary demonstrates blood flow around the left adnexal mass but not within the mass. Other findings There is a small amount of free fluid in the pelvis. IMPRESSION: 1. There is a mass in the left side of the pelvis. While not specific, the lack of a separate left ovary suggests this mass is most likely ovarian in origin. Also, the mass on CT imaging is in the same location as the ovary was on the June 21, 2014 CT. The pelvic  vessels also lead to the mass on CT imaging. No internal blood flow could be seen within the mass on ultrasound. However, the patient's large body habitus does limit evaluation. Also, there is no obvious displacement of the probable left ovary or twisting of the vasculature seen on CT imaging. The findings on today's CT scan and ultrasound could represent the left ovary largely replaced with a large hemorrhagic cyst or endometrioma. However, left ovarian torsion cannot be excluded on this ultrasound. A left ovarian neoplasm is considered less likely. Treatment should be based on clinical and physical exam. If the patient is not taken to surgery, I would recommend a follow-up ultrasound in 6-12 weeks to ensure resolution. Findings called to and discussed with Dr. Thurnell Garbe. Electronically Signed   By: Dorise Bullion III M.D   On: 12/05/2017 20:28    Ct Renal Stone Study Result Date: 12/05/2017 CLINICAL DATA:  Left flank pain. EXAM: CT ABDOMEN AND PELVIS WITHOUT CONTRAST TECHNIQUE: Multidetector CT imaging of the abdomen and pelvis was performed following the standard protocol without IV contrast. COMPARISON:  CT scan June 21, 2014. FINDINGS: Lower chest: No acute abnormality. Hepatobiliary: Previous cholecystectomy.  The liver is unremarkable. Pancreas: Unremarkable. No pancreatic ductal dilatation or surrounding inflammatory changes. Spleen: Normal in size without focal abnormality. Adrenals/Urinary Tract: Adrenal glands are normal. A 4 mm stone is seen in the lower pole of the right kidney. No other renal stones. The ureters are normal with no stones. The bladder is normal. Stomach/Bowel: The stomach and small bowel are normal. The colon is normal. Previous appendectomy. Vascular/Lymphatic: The abdominal aorta is normal.  No adenopathy. Reproductive: Patient is status post hysterectomy. The right ovary is not definitively seen. There is a mass in the left adnexa measuring 6 by 4.3 cm with mild adjacent fat  stranding. Stranding and a small amount of probable fluid in the pelvis is identified, possibly secondary to the left adnexal abnormality. Other: No abdominal wall hernia or abnormality. No abdominopelvic ascites. Musculoskeletal: No acute or significant osseous findings. IMPRESSION: 1. There is a 6 x 4.3 cm mass in the left adnexa. Assuming the left ovary was not removed, I suspect this is most likely ovarian in origin and could represent a large hemorrhagic cyst/follicle, a left ovarian neoplasm, or a left para ovarian mass. It would be difficult to exclude a torsed left ovary on this study. There is surrounding inflammation and a small amount of secondary fluid in the pelvis. 2. 4 mm stone in the lower right kidney without obstruction. No ureteral stones. Electronically Signed   By: Dorise Bullion III M.D   On: 12/05/2017 17:01     1800: Per lab: Cdiff test unable to be run on stool sample given due to stool being formed (not diarrhea/watery).   2120:  CT with mass in left adnexa. US pelvis completed: mass left ovary with blood flow around the mass. Pt would prefer to defer pelvic exam at this time. T/C returned from OB/GYN Dr. Glo Herring, case discussed, including:  HPI, pertinent PM/SHx, VS/PE, dx testing, ED course and treatment:  Torsion not likely with pt s/p hysterectomy as well as blood flow seen around left ovary/mass, pain control, f/u office early this week. Dx and testing, as well as d/w OB/GYN MD, d/w pt and family.  Questions answered.  Verb understanding, agreeable to d/c home with outpt f/u.      Final Clinical Impressions(s) / ED Diagnoses   Final diagnoses:  None    ED Discharge Orders    None       Francine Graven, DO 12/10/17 1216

## 2017-12-05 NOTE — Discharge Instructions (Addendum)
Take the prescriptions as directed.  Apply moist heat to the area(s) of discomfort, for 15 minutes at a time, several times per day for the next few days.  Do not fall asleep on a heating pack.  Call your regular OB/GYN doctor on Monday to schedule a follow up appointment within the next 3 to 4 days. Return to the Emergency Department immediately if worsening.

## 2017-12-05 NOTE — ED Notes (Signed)
Patient transported to Ultrasound 

## 2017-12-07 LAB — GASTROINTESTINAL PANEL BY PCR, STOOL (REPLACES STOOL CULTURE)

## 2017-12-08 ENCOUNTER — Encounter (INDEPENDENT_AMBULATORY_CARE_PROVIDER_SITE_OTHER): Payer: Self-pay

## 2017-12-08 ENCOUNTER — Ambulatory Visit: Payer: BLUE CROSS/BLUE SHIELD | Admitting: Obstetrics & Gynecology

## 2017-12-08 ENCOUNTER — Encounter: Payer: Self-pay | Admitting: Obstetrics & Gynecology

## 2017-12-08 VITALS — BP 115/75 | HR 78 | Ht 67.0 in | Wt 305.0 lb

## 2017-12-08 DIAGNOSIS — N831 Corpus luteum cyst of ovary, unspecified side: Secondary | ICD-10-CM | POA: Diagnosis not present

## 2017-12-08 MED ORDER — MEGESTROL ACETATE 40 MG PO TABS: 40.0000 mg | ORAL_TABLET | Freq: Every day | ORAL | 11 refills | Status: DC

## 2017-12-08 NOTE — Progress Notes (Signed)
Chief Complaint  Patient presents with  . Follow-up    CT done      42 y.o. [id number] Patient's last menstrual period was 09/26/2015 (exact date). The current method of family planning is hysterectomy.  Outpatient Encounter Medications as of 12/08/2017  Medication Sig  . ALPRAZolam (XANAX) 1 MG tablet Take 0.5-1 mg by mouth 3 (three) times daily as needed for anxiety.  Marland Kitchen omeprazole (PRILOSEC) 20 MG capsule Take 20 mg by mouth daily.   Marland Kitchen oxyCODONE-acetaminophen (PERCOCET/ROXICET) 5-325 MG tablet 1 or 2 tabs PO q6h prn pain  . sertraline (ZOLOFT) 100 MG tablet Take 100 mg by mouth every morning.  . cefdinir (OMNICEF) 300 MG capsule Take 300 mg by mouth 2 (two) times daily.  Marland Kitchen HYDROcodone-homatropine (HYCODAN) 5-1.5 MG/5ML syrup Take 5 mLs by mouth every 6 (six) hours as needed.  Marland Kitchen levocetirizine (XYZAL) 5 MG tablet TAKE ONE TABLET BY MOUTH EVERY EVENING. (Patient not taking: Reported on 12/08/2017)  . megestrol (MEGACE) 40 MG tablet Take 1 tablet (40 mg total) by mouth daily.  . ondansetron (ZOFRAN ODT) 4 MG disintegrating tablet Take 1 tablet (4 mg total) by mouth every 8 (eight) hours as needed for nausea or vomiting. (Patient not taking: Reported on 12/08/2017)   No facility-administered encounter medications on file as of 12/08/2017.     Subjective Julie [name] presented to ED with LLQ pain CT sonogram reveals a hemorrhagic corpus luteum 5.1x4.8 cm No other associated abnormalities Labs otherwise are normal Past Medical History:  Diagnosis Date  . Anemia   . Anemia   . Anxiety   . Breast discharge 07/01/2013   Pt noticed discharge, none on exam today will check TSH and Prolactin  . Breast mass, right 10/20/2012  . Depression   . Dysmenorrhea 09/19/2015  . Dyspareunia in female 09/19/2015  . Ectopic pregnancy   . Eczema   . GERD (gastroesophageal reflux disease)    no meds  . Headache   . Hernia of abdominal wall 01/04/2013  . Kidney stone   . Left breast mass 04/12/2014     Has pea sized nodule at 6 o'clock tender and mobile, will get mammogram and [place] if needed  . Menorrhagia with regular cycle 09/19/2015  . Pelvic pain in female 09/19/2015  . Placenta previa 2012   Current pregnancy   . Urinary tract infection   . Vaginal discharge 07/01/2013    Past Surgical History:  Procedure Laterality Date  . ABDOMINAL HYSTERECTOMY N/A 10/10/2015   Procedure: HYSTERECTOMY ABDOMINAL;  Surgeon: Florian Buff, MD;  Location: [facility];  Service: Gynecology;  Laterality: N/A;  . APPENDECTOMY    . APPENDECTOMY    . BILATERAL SALPINGECTOMY Bilateral 10/10/2015   Procedure: BILATERAL SALPINGECTOMY;  Surgeon: Florian Buff, MD;  Location: [facility];  Service: Gynecology;  Laterality: Bilateral;  . BREAST BIOPSY Right   . CESAREAN SECTION  04/24/2011   Procedure: CESAREAN SECTION;  Surgeon: Florian Buff, MD;  Location: [place] [facility];  Service: Gynecology;  Laterality: N/A;  Primary/Previa  . CHOLECYSTECTOMY    . CYSTOSCOPY W/ URETERAL STENT PLACEMENT Left 08/04/2014   Procedure: CYSTOSCOPY WITH LEFT  RETROGRADE PYELOGRAM/ LEFT URETERAL STENT PLACEMENT;  Surgeon: Festus Aloe, MD;  Location: [facility];  Service: Urology;  Laterality: Left;  . TONSILLECTOMY      OB History    Gravida  2   Para  1   Term  0   Preterm  1   AB  1   Living  1     SAB  0   TAB  0   Ectopic  1   Multiple  0   Live Births  1           Allergies  Allergen Reactions  . Clarithromycin Other (See Comments)    Makes throat raw  . Topamax [Topiramate] Itching    Social History   Socioeconomic History  . Marital status: Married    Spouse name: Not on file  . Number of children: Not on file  . Years of education: Not on file  . Highest education level: Not on file  Occupational History  . Not on file  Social Needs  . Financial resource strain: Not on file  . Food insecurity:    Worry: Not on file    Inability: Not on file  . Transportation needs:    Medical: Not on file     Non-medical: Not on file  Tobacco Use  . Smoking status: Former Smoker    Years: 14.00    Types: Cigarettes    Last attempt to quit: 09/07/2009    Years since quitting: 8.2  . Smokeless tobacco: Never Used  Substance and Sexual Activity  . Alcohol use: Yes    Comment: occ mixed drink  . Drug use: No  . Sexual activity: Yes    Birth control/protection: Surgical    Comment: hyst  Lifestyle  . Physical activity:    Days per week: Not on file    Minutes per session: Not on file  . Stress: Not on file  Relationships  . Social connections:    Talks on phone: Not on file    Gets together: Not on file    Attends religious service: Not on file    Active member of club or organization: Not on file    Attends meetings of clubs or organizations: Not on file    Relationship status: Not on file  Other Topics Concern  . Not on file  Social History Narrative  . Not on file    Family History  Problem Relation Age of Onset  . Depression Mother   . Cancer Mother   . Seizures Mother   . Bipolar disorder Mother   . Asthma Mother   . Hypertension Father   . Asthma Father   . Heart disease Maternal Grandmother   . Cancer Maternal Grandmother        oral; lymph nodes  . Hypertension Maternal Grandmother   . Other Maternal Grandmother        memory problems  . Stroke Paternal Grandfather   . Hypertension Paternal Grandfather   . Other Paternal Grandfather        brain tumors  . Heart disease Maternal Grandfather   . Pancreatitis Maternal Grandfather   . Cancer Paternal Grandmother        breast  . Heart disease Paternal Grandmother   . Kidney disease Paternal Grandmother        kidney failure  . Diabetes Paternal Grandmother   . Urticaria Son   . Allergic rhinitis Neg Hx   . Angioedema Neg Hx   . Eczema Neg Hx   . Immunodeficiency Neg Hx     Medications:       Current Outpatient Medications:  .  ALPRAZolam (XANAX) 1 MG tablet, Take 0.5-1 mg by mouth 3 (three) times daily as  needed for anxiety., Disp: , Rfl:  .  omeprazole (PRILOSEC) 20  MG capsule, Take 20 mg by mouth daily. , Disp: , Rfl: 4 .  oxyCODONE-acetaminophen (PERCOCET/ROXICET) 5-325 MG tablet, 1 or 2 tabs PO q6h prn pain, Disp: 15 tablet, Rfl: 0 .  sertraline (ZOLOFT) 100 MG tablet, Take 100 mg by mouth every morning., Disp: , Rfl:  .  cefdinir (OMNICEF) 300 MG capsule, Take 300 mg by mouth 2 (two) times daily., Disp: , Rfl: 0 .  HYDROcodone-homatropine (HYCODAN) 5-1.5 MG/5ML syrup, Take 5 mLs by mouth every 6 (six) hours as needed., Disp: , Rfl: 0 .  levocetirizine (XYZAL) 5 MG tablet, TAKE ONE TABLET BY MOUTH EVERY EVENING. (Patient not taking: Reported on 12/08/2017), Disp: 30 tablet, Rfl: 1 .  megestrol (MEGACE) 40 MG tablet, Take 1 tablet (40 mg total) by mouth daily., Disp: 30 tablet, Rfl: 11 .  ondansetron (ZOFRAN ODT) 4 MG disintegrating tablet, Take 1 tablet (4 mg total) by mouth every 8 (eight) hours as needed for nausea or vomiting. (Patient not taking: Reported on 12/08/2017), Disp: 6 tablet, Rfl: 0  Objective Blood pressure 115/75, pulse 78, height 5\' 7"  (1.702 m), weight (!) 305 lb (138.3 kg), last menstrual period 09/26/2015.  Gen [facility] NAD   Pertinent ROS No burning with urination, frequency or urgency No nausea, vomiting or diarrhea Nor fever chills or other constitutional symptoms   Labs or studies meds and scans are reviewed    Impression Diagnoses this Encounter::   ICD-10-CM   1. Corpus luteum cyst hemorrhage N83.10 US PELVIS TRANSVANGINAL NON-OB (TV ONLY)    Established relevant diagnosis(es):   Plan/Recommendations: Meds ordered this encounter  Medications  . megestrol (MEGACE) 40 MG tablet    Sig: Take 1 tablet (40 mg total) by mouth daily.    Dispense:  30 tablet    Refill:  11    Labs or Scans Ordered: Orders Placed This Encounter  Procedures  . US PELVIS TRANSVANGINAL NON-OB (TV ONLY)    Management:: Megace suppression of a hemorrhagic corpus luteum  cyst  Follow up Return in about 2 months (around 02/08/2018) for GYN sono, Follow up, with Dr Elonda Husky.        Face to face time:  15 minutes  Greater than 50% of the visit time was spent in counseling and coordination of care with the patient.  The summary and outline of the counseling and care coordination is summarized in the note above.   All questions were answered.

## 2017-12-11 DIAGNOSIS — N3021 Other chronic cystitis with hematuria: Secondary | ICD-10-CM | POA: Diagnosis not present

## 2017-12-11 DIAGNOSIS — N200 Calculus of kidney: Secondary | ICD-10-CM | POA: Diagnosis not present

## 2017-12-14 DIAGNOSIS — M9901 Segmental and somatic dysfunction of cervical region: Secondary | ICD-10-CM | POA: Diagnosis not present

## 2017-12-14 DIAGNOSIS — M546 Pain in thoracic spine: Secondary | ICD-10-CM | POA: Diagnosis not present

## 2017-12-14 DIAGNOSIS — M9902 Segmental and somatic dysfunction of thoracic region: Secondary | ICD-10-CM | POA: Diagnosis not present

## 2017-12-14 DIAGNOSIS — G44219 Episodic tension-type headache, not intractable: Secondary | ICD-10-CM | POA: Diagnosis not present

## 2017-12-21 DIAGNOSIS — F329 Major depressive disorder, single episode, unspecified: Secondary | ICD-10-CM | POA: Diagnosis not present

## 2017-12-21 DIAGNOSIS — Z6841 Body Mass Index (BMI) 40.0 and over, adult: Secondary | ICD-10-CM | POA: Diagnosis not present

## 2017-12-21 DIAGNOSIS — Z1389 Encounter for screening for other disorder: Secondary | ICD-10-CM | POA: Diagnosis not present

## 2017-12-25 DIAGNOSIS — M9902 Segmental and somatic dysfunction of thoracic region: Secondary | ICD-10-CM | POA: Diagnosis not present

## 2017-12-25 DIAGNOSIS — M546 Pain in thoracic spine: Secondary | ICD-10-CM | POA: Diagnosis not present

## 2017-12-25 DIAGNOSIS — M9901 Segmental and somatic dysfunction of cervical region: Secondary | ICD-10-CM | POA: Diagnosis not present

## 2017-12-31 DIAGNOSIS — K625 Hemorrhage of anus and rectum: Secondary | ICD-10-CM | POA: Diagnosis not present

## 2017-12-31 DIAGNOSIS — R194 Change in bowel habit: Secondary | ICD-10-CM | POA: Diagnosis not present

## 2017-12-31 DIAGNOSIS — R1310 Dysphagia, unspecified: Secondary | ICD-10-CM | POA: Diagnosis not present

## 2017-12-31 DIAGNOSIS — K219 Gastro-esophageal reflux disease without esophagitis: Secondary | ICD-10-CM | POA: Diagnosis not present

## 2018-01-18 DIAGNOSIS — G44219 Episodic tension-type headache, not intractable: Secondary | ICD-10-CM | POA: Diagnosis not present

## 2018-01-18 DIAGNOSIS — M9902 Segmental and somatic dysfunction of thoracic region: Secondary | ICD-10-CM | POA: Diagnosis not present

## 2018-01-18 DIAGNOSIS — M546 Pain in thoracic spine: Secondary | ICD-10-CM | POA: Diagnosis not present

## 2018-01-18 DIAGNOSIS — M9901 Segmental and somatic dysfunction of cervical region: Secondary | ICD-10-CM | POA: Diagnosis not present

## 2018-01-20 DIAGNOSIS — R1310 Dysphagia, unspecified: Secondary | ICD-10-CM | POA: Diagnosis not present

## 2018-01-20 DIAGNOSIS — Z1211 Encounter for screening for malignant neoplasm of colon: Secondary | ICD-10-CM | POA: Diagnosis not present

## 2018-01-20 DIAGNOSIS — K625 Hemorrhage of anus and rectum: Secondary | ICD-10-CM | POA: Diagnosis not present

## 2018-01-20 DIAGNOSIS — K219 Gastro-esophageal reflux disease without esophagitis: Secondary | ICD-10-CM | POA: Diagnosis not present

## 2018-01-20 DIAGNOSIS — R194 Change in bowel habit: Secondary | ICD-10-CM | POA: Diagnosis not present

## 2018-02-01 DIAGNOSIS — N3021 Other chronic cystitis with hematuria: Secondary | ICD-10-CM | POA: Diagnosis not present

## 2018-02-01 DIAGNOSIS — N200 Calculus of kidney: Secondary | ICD-10-CM | POA: Diagnosis not present

## 2018-02-05 ENCOUNTER — Other Ambulatory Visit: Payer: Self-pay | Admitting: Obstetrics & Gynecology

## 2018-02-05 DIAGNOSIS — N831 Corpus luteum cyst of ovary, unspecified side: Secondary | ICD-10-CM

## 2018-02-09 ENCOUNTER — Encounter: Payer: Self-pay | Admitting: *Deleted

## 2018-02-09 ENCOUNTER — Ambulatory Visit (INDEPENDENT_AMBULATORY_CARE_PROVIDER_SITE_OTHER): Payer: BLUE CROSS/BLUE SHIELD

## 2018-02-09 ENCOUNTER — Encounter: Payer: Self-pay | Admitting: Obstetrics & Gynecology

## 2018-02-09 ENCOUNTER — Ambulatory Visit (INDEPENDENT_AMBULATORY_CARE_PROVIDER_SITE_OTHER): Payer: BLUE CROSS/BLUE SHIELD | Admitting: Obstetrics & Gynecology

## 2018-02-09 VITALS — BP 136/94 | HR 83 | Ht 67.0 in | Wt 311.0 lb

## 2018-02-09 DIAGNOSIS — R0789 Other chest pain: Secondary | ICD-10-CM

## 2018-02-09 DIAGNOSIS — N831 Corpus luteum cyst of ovary, unspecified side: Secondary | ICD-10-CM

## 2018-02-09 NOTE — Progress Notes (Signed)
PELVIC US TA/TV: normal vaginal cuff,no free fluid,normal right ovary,simple left ovarian cyst 3 x 3 x 2.7 cm,ovaries appear mobile,left adnexa's pain during ultrasound

## 2018-02-09 NOTE — Progress Notes (Signed)
Follow up appointment for results  Chief Complaint  Patient presents with  . Follow-up    discuss [place]; pain in left breast    Blood pressure (!) 136/94, pulse 83, height 5\' 7"  (1.702 m), weight (!) 311 lb (141.1 kg), last menstrual period 09/26/2015.  US Pelvis Transvanginal Non-ob (tv Only)  Result Date: 02/09/2018 GYNECOLOGIC SONOGRAM Sahara Fujimoto is a 42 y.o. [phone] s/p hysterectomy,she is here for a pelvic sonogram for LLQ pain, f/u left ovarian cyst. Uterus                     Surgically removed,normal vaginal cuff Endometrium          N/a Right ovary             2.3 x 3.2 x 1.8 cm, wnl Left ovary                4.5 x 3.7 x 4 cm, simple left ovarian cyst 3 x 3 x 2.7 cm No free fluid Technician Comments: PELVIC US TA/TV: normal vaginal cuff,no free fluid,normal right ovary,simple left ovarian cyst 3 x 3 x 2.7 cm,ovaries appear mobile,left adnexa's pain during ultrasound [employer] 02/09/2018 9:10 AM Clinical Impression and recommendations: I have reviewed the sonogram results above, combined with the patient's current clinical course, below are my impressions and any appropriate recommendations for management based on the sonographic findings. Uterus and cervix endometrium absent Left complex ovarian cyst has resolved, was most likely a hemorrhagic corpus luteum since it suppressed with megestrol, now has a small simple cyst physiologic, no therapy needed Right ovary is normal Florian Buff 02/09/2018 9:33 AM   US Pelvis Complete  Result Date: 02/09/2018 GYNECOLOGIC SONOGRAM Marly Schuld is a 42 y.o. [id number] s/p hysterectomy,she is here for a pelvic sonogram for LLQ pain, f/u left ovarian cyst. Uterus                     Surgically removed,normal vaginal cuff Endometrium          N/a Right ovary             2.3 x 3.2 x 1.8 cm, wnl Left ovary                4.5 x 3.7 x 4 cm, simple left ovarian cyst 3 x 3 x 2.7 cm No free fluid Technician Comments: PELVIC US TA/TV: normal vaginal cuff,no free  fluid,normal right ovary,simple left ovarian cyst 3 x 3 x 2.7 cm,ovaries appear mobile,left adnexa's pain during ultrasound [employer] 02/09/2018 9:10 AM Clinical Impression and recommendations: I have reviewed the sonogram results above, combined with the patient's current clinical course, below are my impressions and any appropriate recommendations for management based on the sonographic findings. Uterus and cervix endometrium absent Left complex ovarian cyst has resolved, was most likely a hemorrhagic corpus luteum since it suppressed with megestrol, now has a small simple cyst physiologic, no therapy needed Right ovary is normal Florian Buff 02/09/2018 9:33 AM    Left breast without masses skin changes nipple changes or masses The tenderness is in the lateral chest wall on left, ot in breast, the pain stays in the same place even with moving of the breast  MEDS ordered this encounter: No orders of the defined types were placed in this encounter.   Orders for this encounter: No orders of the defined types were placed in this encounter.   Impression: Corpus luteum cyst hemorrhage -  resolved  Chest wall pain    Plan: >may stop megestrol >discomfort is from the chest wall not breast  Follow Up: Return if symptoms worsen or fail to improve.       Face to face time:  15 minutes  Greater than 50% of the visit time was spent in counseling and coordination of care with the patient.  The summary and outline of the counseling and care coordination is summarized in the note above.   All questions were answered.  Past Medical History:  Diagnosis Date  . Anemia   . Anemia   . Anxiety   . Breast discharge 07/01/2013   Pt noticed discharge, none on exam today will check TSH and Prolactin  . Breast mass, right 10/20/2012  . Depression   . Dysmenorrhea 09/19/2015  . Dyspareunia in female 09/19/2015  . Ectopic pregnancy   . Eczema   . GERD (gastroesophageal reflux disease)    no meds   . Headache   . Hernia of abdominal wall 01/04/2013  . Kidney stone   . Left breast mass 04/12/2014   Has pea sized nodule at 6 o'clock tender and mobile, will get mammogram and [place] if needed  . Menorrhagia with regular cycle 09/19/2015  . Pelvic pain in female 09/19/2015  . Placenta previa 2012   Current pregnancy   . Urinary tract infection   . Vaginal discharge 07/01/2013    Past Surgical History:  Procedure Laterality Date  . ABDOMINAL HYSTERECTOMY N/A 10/10/2015   Procedure: HYSTERECTOMY ABDOMINAL;  Surgeon: Florian Buff, MD;  Location: [facility];  Service: Gynecology;  Laterality: N/A;  . APPENDECTOMY    . APPENDECTOMY    . BILATERAL SALPINGECTOMY Bilateral 10/10/2015   Procedure: BILATERAL SALPINGECTOMY;  Surgeon: Florian Buff, MD;  Location: [facility];  Service: Gynecology;  Laterality: Bilateral;  . BREAST BIOPSY Right   . CESAREAN SECTION  04/24/2011   Procedure: CESAREAN SECTION;  Surgeon: Florian Buff, MD;  Location: [facility];  Service: Gynecology;  Laterality: N/A;  Primary/Previa  . CHOLECYSTECTOMY    . COLONOSCOPY    . CYSTOSCOPY W/ URETERAL STENT PLACEMENT Left 08/04/2014   Procedure: CYSTOSCOPY WITH LEFT  RETROGRADE PYELOGRAM/ LEFT URETERAL STENT PLACEMENT;  Surgeon: Festus Aloe, MD;  Location: [facility];  Service: Urology;  Laterality: Left;  . TONSILLECTOMY    . UPPER GI ENDOSCOPY      OB History    Gravida  2   Para  1   Term  0   Preterm  1   AB  1   Living  1     SAB  0   TAB  0   Ectopic  1   Multiple  0   Live Births  1           Allergies  Allergen Reactions  . Clarithromycin Other (See Comments)    Makes throat raw  . Topamax [Topiramate] Itching    Social History   Socioeconomic History  . Marital status: Married    Spouse name: Not on file  . Number of children: Not on file  . Years of education: Not on file  . Highest education level: Not on file  Occupational History  . Not on file  Social Needs  . Financial resource  strain: Not on file  . Food insecurity:    Worry: Not on file    Inability: Not on file  . Transportation needs:    Medical: Not on file  Non-medical: Not on file  Tobacco Use  . Smoking status: Former Smoker    Years: 14.00    Types: Cigarettes    Last attempt to quit: 09/07/2009    Years since quitting: 8.4  . Smokeless tobacco: Never Used  Substance and Sexual Activity  . Alcohol use: Yes    Comment: occ mixed drink  . Drug use: No  . Sexual activity: Yes    Birth control/protection: Surgical    Comment: hyst  Lifestyle  . Physical activity:    Days per week: Not on file    Minutes per session: Not on file  . Stress: Not on file  Relationships  . Social connections:    Talks on phone: Not on file    Gets together: Not on file    Attends religious service: Not on file    Active member of club or organization: Not on file    Attends meetings of clubs or organizations: Not on file    Relationship status: Not on file  Other Topics Concern  . Not on file  Social History Narrative  . Not on file    Family History  Problem Relation Age of Onset  . Depression Mother   . Cancer Mother   . Seizures Mother   . Bipolar disorder Mother   . Asthma Mother   . Hypertension Father   . Asthma Father   . Heart disease Maternal Grandmother   . Cancer Maternal Grandmother        oral; lymph nodes  . Hypertension Maternal Grandmother   . Other Maternal Grandmother        memory problems  . Stroke Paternal Grandfather   . Hypertension Paternal Grandfather   . Other Paternal Grandfather        brain tumors  . Heart disease Maternal Grandfather   . Pancreatitis Maternal Grandfather   . Cancer Paternal Grandmother        breast  . Heart disease Paternal Grandmother   . Kidney disease Paternal Grandmother        kidney failure  . Diabetes Paternal Grandmother   . Urticaria Son   . Allergic rhinitis Neg Hx   . Angioedema Neg Hx   . Eczema Neg Hx   . Immunodeficiency Neg  Hx

## 2018-02-12 DIAGNOSIS — Z6841 Body Mass Index (BMI) 40.0 and over, adult: Secondary | ICD-10-CM | POA: Diagnosis not present

## 2018-02-12 DIAGNOSIS — G44219 Episodic tension-type headache, not intractable: Secondary | ICD-10-CM | POA: Diagnosis not present

## 2018-02-12 DIAGNOSIS — M9902 Segmental and somatic dysfunction of thoracic region: Secondary | ICD-10-CM | POA: Diagnosis not present

## 2018-02-12 DIAGNOSIS — M9901 Segmental and somatic dysfunction of cervical region: Secondary | ICD-10-CM | POA: Diagnosis not present

## 2018-02-12 DIAGNOSIS — M546 Pain in thoracic spine: Secondary | ICD-10-CM | POA: Diagnosis not present

## 2018-02-12 DIAGNOSIS — Z1389 Encounter for screening for other disorder: Secondary | ICD-10-CM | POA: Diagnosis not present

## 2018-02-12 DIAGNOSIS — G43109 Migraine with aura, not intractable, without status migrainosus: Secondary | ICD-10-CM | POA: Diagnosis not present

## 2018-02-16 ENCOUNTER — Encounter: Payer: Self-pay | Admitting: Neurology

## 2018-02-16 DIAGNOSIS — L918 Other hypertrophic disorders of the skin: Secondary | ICD-10-CM | POA: Diagnosis not present

## 2018-02-16 DIAGNOSIS — D225 Melanocytic nevi of trunk: Secondary | ICD-10-CM | POA: Diagnosis not present

## 2018-02-16 DIAGNOSIS — Z1283 Encounter for screening for malignant neoplasm of skin: Secondary | ICD-10-CM | POA: Diagnosis not present

## 2018-03-18 NOTE — Progress Notes (Signed)
NEUROLOGY CONSULTATION NOTE  Julie [name] MRN: [id number] DOB: [birth year]  Referring provider: Sharilyn Sites, MD Primary care provider: Sharilyn Sites, MD  Reason for consult:  migraine  HISTORY OF PRESENT ILLNESS: Julie [name] is a 42 year old female with depression, anxiety and kidney stone who presents for migraine.  History supplemented by referring providers note.  Onset:  Migraines in high school but resolved.  Returned at age 74 years old after birth of her son. Location:  Back of head bilaterally, top of head, radiating down neck into shoulders Quality:  Vice-grip Intensity:  5-10/10 (usually 10/10).  She denies new headache, thunderclap headache Aura:  Sometimes phantosmia (chemicals, exhaust, burning) Prodrome:  no Postdrome:  fatigue Associated symptoms:  Nausea, photophobia, phonophobia, blurred vision.  She denies associated unilateral numbness or weakness. Duration:  Constant. Often wakes up with it. Frequency:  daily Frequency of abortive medication: none Triggers:  No Relieving factors:  Applying pressure to upper paraspinal region, resting in dark and quiet place Activity:  aggravates  Workup included MRI of brain without contrast from 10//19/16, which was personally reviewed and was unremarkable.  Current NSAIDS:  none Current analgesics:  Excedrin (rarely, ineffective) Current triptans:  none Current ergotamine:  none Current anti-emetic:  none Current muscle relaxants:  none Current anti-anxiolytic:  Alprazolam 1mg  PRN Current sleep aide:  none Current Antihypertensive medications:  none Current Antidepressant medications:  Sertraline 100mg  daily Current Anticonvulsant medications:  none Current anti-CGRP:  none Current Vitamins/Herbal/Supplements:  none Current Antihistamines/Decongestants:  none Other therapy:  daith piercing bilaterally, chiropractor Hormone/birth control:  none  Past NSAIDS:  Ibuprofen, naproxen, indomethacin 50mg  three  times daily, Toradol shot (helps) Past analgesics:  Fioricet with codeine, Excedrin, Tylenol Past abortive triptans:  Sumatriptan 100mg  (made her sick), rizatriptan 10mg , eletritan 40mg  Past abortive ergotamine:  No Past muscle relaxants:  Flexeril Past anti-emetic:  Zofran (sometimes helps) Past antihypertensive medications:  Propranolol 80mg  twice daily Past antidepressant medications:  Amitriptyline 25mg  at bedtime, Wellbutrin Past anticonvulsant medications:  topiramate 150mg , zonisamide 25mg  at bedtime Past anti-CGRP:  none Past vitamins/Herbal/Supplements:  none Past antihistamines/decongestants:  none Other past therapies:  Trigger point/nerve blocks, cognitive behavioral therapy, acupuncture  Caffeine:  1 cup coffee daily, tea at dinner Alcohol:  occasional Smoker:  no Diet:  4 glasses water daily, does not eat breakfast, banana, pop tart Exercise:  Not routine Depression:  yes; Anxiety:  yes Other pain:  Diffuse body aches Sleep hygiene: sleeps but not rested.  Sleep study negative for OSA  Family history of headache:  No  12/05/17:  CMP with Na 138, K 3.8, Cl 105, CO2 26, glucose 92, BUN 13, Cr 0.61, t bili 0.3, ALP 57, AST 15, ALT 17; CBC with WBC 8.9, HGB 13.2, HCT 40.2, PLT 203.  PAST MEDICAL HISTORY: Past Medical History:  Diagnosis Date  . Anemia   . Anemia   . Anxiety   . Breast discharge 07/01/2013   Pt noticed discharge, none on exam today will check TSH and Prolactin  . Breast mass, right 10/20/2012  . Depression   . Dysmenorrhea 09/19/2015  . Dyspareunia in female 09/19/2015  . Ectopic pregnancy   . Eczema   . GERD (gastroesophageal reflux disease)    no meds  . Headache   . Hernia of abdominal wall 01/04/2013  . Kidney stone   . Left breast mass 04/12/2014   Has pea sized nodule at 6 o'clock tender and mobile, will get mammogram and [place] if needed  . Menorrhagia  with regular cycle 09/19/2015  . Pelvic pain in female 09/19/2015  . Placenta previa 2012    Current pregnancy   . Urinary tract infection   . Vaginal discharge 07/01/2013    PAST SURGICAL HISTORY: Past Surgical History:  Procedure Laterality Date  . ABDOMINAL HYSTERECTOMY N/A 10/10/2015   Procedure: HYSTERECTOMY ABDOMINAL;  Surgeon: Florian Buff, MD;  Location: [facility];  Service: Gynecology;  Laterality: N/A;  . APPENDECTOMY    . APPENDECTOMY    . BILATERAL SALPINGECTOMY Bilateral 10/10/2015   Procedure: BILATERAL SALPINGECTOMY;  Surgeon: Florian Buff, MD;  Location: [facility];  Service: Gynecology;  Laterality: Bilateral;  . BREAST BIOPSY Right   . CESAREAN SECTION  04/24/2011   Procedure: CESAREAN SECTION;  Surgeon: Florian Buff, MD;  Location: [facility];  Service: Gynecology;  Laterality: N/A;  Primary/Previa  . CHOLECYSTECTOMY    . COLONOSCOPY    . CYSTOSCOPY W/ URETERAL STENT PLACEMENT Left 08/04/2014   Procedure: CYSTOSCOPY WITH LEFT  RETROGRADE PYELOGRAM/ LEFT URETERAL STENT PLACEMENT;  Surgeon: Festus Aloe, MD;  Location: [facility];  Service: Urology;  Laterality: Left;  . TONSILLECTOMY    . UPPER GI ENDOSCOPY      MEDICATIONS: Current Outpatient Medications on File Prior to Visit  Medication Sig Dispense Refill  . ALPRAZolam (XANAX) 1 MG tablet Take 0.5-1 mg by mouth 3 (three) times daily as needed for anxiety.    . Ascorbic Acid (VITAMIN C PO) Take by mouth daily.    Marland Kitchen levocetirizine (XYZAL) 5 MG tablet TAKE ONE TABLET BY MOUTH EVERY EVENING. 30 tablet 1  . megestrol (MEGACE) 40 MG tablet Take 1 tablet (40 mg total) by mouth daily. 30 tablet 11  . omeprazole (PRILOSEC) 20 MG capsule Take 20 mg by mouth daily.   4  . Probiotic Product (PROBIOTIC PO) Take by mouth daily.    . sertraline (ZOLOFT) 100 MG tablet Take 100 mg by mouth every morning.     No current facility-administered medications on file prior to visit.     ALLERGIES: Allergies  Allergen Reactions  . Clarithromycin Other (See Comments)    Makes throat raw  . Topamax [Topiramate] Itching    FAMILY  HISTORY: Family History  Problem Relation Age of Onset  . Depression Mother   . Cancer Mother   . Seizures Mother   . Bipolar disorder Mother   . Asthma Mother   . Hypertension Father   . Asthma Father   . Heart disease Maternal Grandmother   . Cancer Maternal Grandmother        oral; lymph nodes  . Hypertension Maternal Grandmother   . Other Maternal Grandmother        memory problems  . Stroke Paternal Grandfather   . Hypertension Paternal Grandfather   . Other Paternal Grandfather        brain tumors  . Heart disease Maternal Grandfather   . Pancreatitis Maternal Grandfather   . Cancer Paternal Grandmother        breast  . Heart disease Paternal Grandmother   . Kidney disease Paternal Grandmother        kidney failure  . Diabetes Paternal Grandmother   . Urticaria Son   . Allergic rhinitis Neg Hx   . Angioedema Neg Hx   . Eczema Neg Hx   . Immunodeficiency Neg Hx    SOCIAL HISTORY: Social History   Socioeconomic History  . Marital status: Married    Spouse name: Not on file  .  Number of children: Not on file  . Years of education: Not on file  . Highest education level: Not on file  Occupational History  . Not on file  Social Needs  . Financial resource strain: Not on file  . Food insecurity:    Worry: Not on file    Inability: Not on file  . Transportation needs:    Medical: Not on file    Non-medical: Not on file  Tobacco Use  . Smoking status: Former Smoker    Years: 14.00    Types: Cigarettes    Last attempt to quit: 09/07/2009    Years since quitting: 8.5  . Smokeless tobacco: Never Used  Substance and Sexual Activity  . Alcohol use: Yes    Comment: occ mixed drink  . Drug use: No  . Sexual activity: Yes    Birth control/protection: Surgical    Comment: hyst  Lifestyle  . Physical activity:    Days per week: Not on file    Minutes per session: Not on file  . Stress: Not on file  Relationships  . Social connections:    Talks on phone: Not  on file    Gets together: Not on file    Attends religious service: Not on file    Active member of club or organization: Not on file    Attends meetings of clubs or organizations: Not on file    Relationship status: Not on file  . Intimate partner violence:    Fear of current or ex partner: Not on file    Emotionally abused: Not on file    Physically abused: Not on file    Forced sexual activity: Not on file  Other Topics Concern  . Not on file  Social History Narrative  . Not on file    REVIEW OF SYSTEMS: Constitutional: No fevers, chills, or sweats, no generalized fatigue, change in appetite Eyes: No visual changes, double vision, eye pain Ear, nose and throat: No hearing loss, ear pain, nasal congestion, sore throat Cardiovascular: No chest pain, palpitations Respiratory:  No shortness of breath at rest or with exertion, wheezes GastrointestinaI: No nausea, vomiting, diarrhea, abdominal pain, fecal incontinence Genitourinary:  No dysuria, urinary retention or frequency Musculoskeletal:  No neck pain, back pain Integumentary: No rash, pruritus, skin lesions Neurological: as above Psychiatric: No depression, insomnia, anxiety Endocrine: No palpitations, fatigue, diaphoresis, mood swings, change in appetite, change in weight, increased thirst Hematologic/Lymphatic:  No purpura, petechiae. Allergic/Immunologic: no itchy/runny eyes, nasal congestion, recent allergic reactions, rashes  PHYSICAL EXAM: Blood pressure 100/76, pulse 84, height 5\' 7"  (1.702 m), weight (!) 314 lb (142.4 kg), last menstrual period 09/26/2015, SpO2 95 %. General: No acute distress.  Patient appears well-groomed.  Head:  Normocephalic/atraumatic Eyes:  fundi examined but not visualized Neck: supple, no paraspinal tenderness, full range of motion Back: No paraspinal tenderness Heart: regular rate and rhythm Lungs: Clear to auscultation bilaterally. Vascular: No carotid bruits. Neurological  Exam: Mental status: alert and oriented to person, place, and time, recent and remote memory intact, fund of knowledge intact, attention and concentration intact, speech fluent and not dysarthric, language intact. Cranial nerves: CN I: not tested CN II: pupils equal, round and reactive to light, visual fields intact CN III, IV, VI:  full range of motion, no nystagmus, no ptosis CN V: facial sensation intact CN VII: upper and lower face symmetric CN VIII: hearing intact CN IX, X: gag intact, uvula midline CN XI: sternocleidomastoid and trapezius muscles intact  CN XII: tongue midline Bulk & Tone: normal, no fasciculations. Motor:  5/5 throughout  Sensation:  Pinprick and vibration sensation intact. Deep Tendon Reflexes:  2+ throughout, toes downgoing.  Finger to nose testing:  Without dysmetria.  Heel to shin:  Without dysmetria.  Gait:  Normal station and stride.  Romberg negative.  IMPRESSION: 1.  chronic migraine without (sometimes with) aura, without status migrainosus, intractable 2.  Depression and anxiety 3.  Morbid obesity (BMI 49.18)  PLAN: 1.  For preventative management, we will schedule for Botox.  She has has well exceeded 3 consecutive months of daily headache and has failed multiple preventative medications such as propranolol, amitriptyline, topiramate, and zonisamide. 2. As she has a constant intractable headache, abortive medications will likely not be effective at this time.  Once we could break the headaches so that they are episodic, we can begin an abortive medication. 3.  Limit use of pain relievers to no more than 2 days out of week to prevent risk of rebound or medication-overuse headache. 4.  Keep headache diary 5.  Exercise, hydration, caffeine cessation, sleep hygiene, weight loss, monitor for and avoid triggers 6.  Consider:  magnesium citrate 400mg  daily, riboflavin 400mg  daily, and coenzyme Q10 100mg  three times daily 7.  Follow up for Botox  Thank you  for allowing me to take part in the care of this patient.  Metta Clines, DO  CC:  Sharilyn Sites, MD

## 2018-03-19 ENCOUNTER — Ambulatory Visit (INDEPENDENT_AMBULATORY_CARE_PROVIDER_SITE_OTHER): Payer: BLUE CROSS/BLUE SHIELD | Admitting: Neurology

## 2018-03-19 ENCOUNTER — Encounter: Payer: Self-pay | Admitting: Neurology

## 2018-03-19 VITALS — BP 100/76 | HR 84 | Ht 67.0 in | Wt 314.0 lb

## 2018-03-19 DIAGNOSIS — F32A Depression, unspecified: Secondary | ICD-10-CM

## 2018-03-19 DIAGNOSIS — F329 Major depressive disorder, single episode, unspecified: Secondary | ICD-10-CM

## 2018-03-19 DIAGNOSIS — G43719 Chronic migraine without aura, intractable, without status migrainosus: Secondary | ICD-10-CM | POA: Diagnosis not present

## 2018-03-19 DIAGNOSIS — F419 Anxiety disorder, unspecified: Secondary | ICD-10-CM | POA: Diagnosis not present

## 2018-03-19 NOTE — Patient Instructions (Signed)
We will set you up for Botox.

## 2018-03-22 ENCOUNTER — Telehealth: Payer: Self-pay | Admitting: Neurology

## 2018-03-22 NOTE — Telephone Encounter (Signed)
Called Pt LMOVM advising her I will let her know when I receive a determination on the Botox authorization.

## 2018-03-22 NOTE — Telephone Encounter (Signed)
°  Patient is calling to see if the Prior Auth on her Botox injections was completed. Please call her back at [phone]. Thanks!

## 2018-03-24 DIAGNOSIS — M546 Pain in thoracic spine: Secondary | ICD-10-CM | POA: Diagnosis not present

## 2018-03-24 DIAGNOSIS — M9901 Segmental and somatic dysfunction of cervical region: Secondary | ICD-10-CM | POA: Diagnosis not present

## 2018-03-24 DIAGNOSIS — M9902 Segmental and somatic dysfunction of thoracic region: Secondary | ICD-10-CM | POA: Diagnosis not present

## 2018-03-24 DIAGNOSIS — G44219 Episodic tension-type headache, not intractable: Secondary | ICD-10-CM | POA: Diagnosis not present

## 2018-03-29 DIAGNOSIS — J329 Chronic sinusitis, unspecified: Secondary | ICD-10-CM | POA: Diagnosis not present

## 2018-03-29 DIAGNOSIS — J069 Acute upper respiratory infection, unspecified: Secondary | ICD-10-CM | POA: Diagnosis not present

## 2018-03-29 DIAGNOSIS — H6692 Otitis media, unspecified, left ear: Secondary | ICD-10-CM | POA: Diagnosis not present

## 2018-04-06 ENCOUNTER — Telehealth: Payer: Self-pay | Admitting: Neurology

## 2018-04-06 NOTE — Telephone Encounter (Signed)
Patient is calling in wanting to know if her insurance approved her Botox and then Dr.Jaffe said something about an earlier appt if her Botox was approving it. Please call her back at [phone]. Thanks!

## 2018-04-06 NOTE — Telephone Encounter (Signed)
Called and advised Pt Julie [name] has been submitted, have not rcvd determination, but I will check again this afternoon and call her back only if denied.

## 2018-04-07 ENCOUNTER — Telehealth: Payer: Self-pay | Admitting: Neurology

## 2018-04-07 NOTE — Telephone Encounter (Signed)
Faxed clinicals

## 2018-04-07 NOTE — Telephone Encounter (Signed)
Anderson Malta from [facility] is calling in leaving a msg about drug auth needed. Send the clinical notes about why the current medication is needed and why she is using them. And needing PA form. Please fax to [phone]. Thanks!

## 2018-04-08 NOTE — Progress Notes (Signed)
Botox apprvd 04/06/18 - 10/03/18 BUL#[id number]

## 2018-04-08 NOTE — Progress Notes (Signed)
Called and LMOVM advising Pt Botox approved

## 2018-04-14 ENCOUNTER — Encounter

## 2018-04-14 ENCOUNTER — Ambulatory Visit: Payer: BLUE CROSS/BLUE SHIELD | Admitting: Neurology

## 2018-04-14 ENCOUNTER — Ambulatory Visit (INDEPENDENT_AMBULATORY_CARE_PROVIDER_SITE_OTHER): Payer: BLUE CROSS/BLUE SHIELD | Admitting: Neurology

## 2018-04-14 DIAGNOSIS — IMO0002 Reserved for concepts with insufficient information to code with codable children: Secondary | ICD-10-CM

## 2018-04-14 DIAGNOSIS — G43719 Chronic migraine without aura, intractable, without status migrainosus: Secondary | ICD-10-CM

## 2018-04-14 DIAGNOSIS — G43709 Chronic migraine without aura, not intractable, without status migrainosus: Secondary | ICD-10-CM

## 2018-04-14 MED ORDER — ONABOTULINUMTOXINA 100 UNITS IJ SOLR
155.0000 [IU] | Freq: Once | INTRAMUSCULAR | Status: AC
  Administered 2018-04-14: 155 [IU] via INTRAMUSCULAR

## 2018-04-14 NOTE — Progress Notes (Signed)
[facility]   Procedure Note Botox  Attending: Dr. Orville Widmann  Preoperative Diagnosis(es): Chronic migraine  Consent obtained from: The patient Benefits discussed included, but were not limited to decreased muscle tightness, increased joint range of motion, and decreased pain.  Risk discussed included, but were not limited pain and discomfort, bleeding, bruising, excessive weakness, venous thrombosis, muscle atrophy and dysphagia.  Anticipated outcomes of the procedure as well as he risks and benefits of the alternatives to the procedure, and the roles and tasks of the personnel to be involved, were discussed with the patient, and the patient consents to the procedure and agrees to proceed. A copy of the patient medication guide was given to the patient which explains the blackbox warning.  Patients identity and treatment sites confirmed Yes.  .  Details of Procedure: Skin was cleaned with alcohol. Prior to injection, the needle plunger was aspirated to make sure the needle was not within a blood vessel.  There was no blood retrieved on aspiration.    Following is a summary of the muscles injected  And the amount of Botulinum toxin used:  Dilution 200 units of Botox was reconstituted with 4 ml of preservative free normal saline. Time of reconstitution: At the time of the office visit (<30 minutes prior to injection)   Injections  155 total units of Botox was injected with a 30 gauge needle.  Injection Sites: L occipitalis: 15 units- 3 sites  R occiptalis: 15 units- 3 sites  L upper trapezius: 15 units- 3 sites R upper trapezius: 15 units- 3 sits          L paraspinal: 10 units- 2 sites R paraspinal: 10 units- 2 sites  Face L frontalis(2 injection sites):10 units   R frontalis(2 injection sites):10 units         L corrugator: 5 units   R corrugator: 5 units           Procerus: 5 units   L temporalis: 20 units R temporalis: 20 units   Agent:  200 units of botulinum Type A  (Onobotulinum Toxin type A) was reconstituted with 4 ml of preservative free normal saline.  Time of reconstitution: At the time of the office visit (<30 minutes prior to injection)     Total injected (Units): 155  Total wasted (Units): 45  Patient tolerated procedure well without complications.   Reinjection is anticipated in 3 months. Return to clinic in 4 1/2 months   

## 2018-04-20 ENCOUNTER — Emergency Department (HOSPITAL_COMMUNITY)
Admission: EM | Admit: 2018-04-20 | Discharge: 2018-04-20 | Disposition: A | Discharge status: Home / Self Care | Payer: BLUE CROSS/BLUE SHIELD | Attending: Emergency Medicine | Admitting: Emergency Medicine

## 2018-04-20 ENCOUNTER — Encounter (HOSPITAL_COMMUNITY): Payer: Self-pay | Admitting: Emergency Medicine

## 2018-04-20 ENCOUNTER — Other Ambulatory Visit: Payer: Self-pay

## 2018-04-20 ENCOUNTER — Emergency Department (HOSPITAL_COMMUNITY): Payer: BLUE CROSS/BLUE SHIELD

## 2018-04-20 DIAGNOSIS — Z87891 Personal history of nicotine dependence: Secondary | ICD-10-CM | POA: Diagnosis not present

## 2018-04-20 DIAGNOSIS — N200 Calculus of kidney: Secondary | ICD-10-CM | POA: Diagnosis not present

## 2018-04-20 DIAGNOSIS — Z6841 Body Mass Index (BMI) 40.0 and over, adult: Secondary | ICD-10-CM | POA: Diagnosis not present

## 2018-04-20 DIAGNOSIS — Z9049 Acquired absence of other specified parts of digestive tract: Secondary | ICD-10-CM | POA: Insufficient documentation

## 2018-04-20 DIAGNOSIS — F329 Major depressive disorder, single episode, unspecified: Secondary | ICD-10-CM | POA: Diagnosis not present

## 2018-04-20 DIAGNOSIS — N3001 Acute cystitis with hematuria: Secondary | ICD-10-CM | POA: Diagnosis not present

## 2018-04-20 DIAGNOSIS — N342 Other urethritis: Secondary | ICD-10-CM | POA: Diagnosis not present

## 2018-04-20 DIAGNOSIS — Z79899 Other long term (current) drug therapy: Secondary | ICD-10-CM | POA: Insufficient documentation

## 2018-04-20 DIAGNOSIS — F419 Anxiety disorder, unspecified: Secondary | ICD-10-CM | POA: Insufficient documentation

## 2018-04-20 DIAGNOSIS — R319 Hematuria, unspecified: Secondary | ICD-10-CM | POA: Diagnosis not present

## 2018-04-20 DIAGNOSIS — R1031 Right lower quadrant pain: Secondary | ICD-10-CM | POA: Diagnosis present

## 2018-04-20 DIAGNOSIS — Z1389 Encounter for screening for other disorder: Secondary | ICD-10-CM | POA: Diagnosis not present

## 2018-04-20 LAB — URINALYSIS, ROUTINE W REFLEX MICROSCOPIC
Bilirubin Urine: NEGATIVE
Glucose, UA: NEGATIVE mg/dL
Ketones, ur: NEGATIVE mg/dL
Nitrite: NEGATIVE
Protein, ur: 30 mg/dL — AB
RBC / HPF: >50 RBC/hpf — ABNORMAL HIGH (ref 0–5)
Specific Gravity, Urine: 1.014 (ref 1.005–1.030)
WBC, UA: >50 WBC/hpf — ABNORMAL HIGH (ref 0–5)
pH: 8 (ref 5.0–8.0)

## 2018-04-20 MED ORDER — SODIUM CHLORIDE 0.9 % IV BOLUS
1000.0000 mL | Freq: Once | INTRAVENOUS | Status: AC
  Administered 2018-04-20: 1000 mL via INTRAVENOUS

## 2018-04-20 MED ORDER — KETOROLAC TROMETHAMINE 30 MG/ML IJ SOLN
30.0000 mg | Freq: Once | INTRAMUSCULAR | Status: AC
  Administered 2018-04-20: 30 mg via INTRAVENOUS
  Filled 2018-04-20: qty 1

## 2018-04-20 MED ORDER — ONDANSETRON HCL 4 MG/2ML IJ SOLN
4.0000 mg | Freq: Once | INTRAMUSCULAR | Status: AC
  Administered 2018-04-20: 4 mg via INTRAVENOUS
  Filled 2018-04-20: qty 2

## 2018-04-20 NOTE — Discharge Instructions (Addendum)
As discussed, your evaluation today has been largely reassuring.  But, it is important that you monitor your condition carefully, and do not hesitate to return to the ED if you develop new, or concerning changes in your condition.  Is obtain and take your prescriptions as instructed.  Otherwise, please follow-up with your physician for appropriate ongoing care.

## 2018-04-20 NOTE — ED Triage Notes (Signed)
Pt began having R sided flank pain, was seen by pcp and "tested positive for a really bad UTI". States it feels like she has a kidney stone.

## 2018-04-20 NOTE — ED Provider Notes (Signed)
[facility] EMERGENCY DEPARTMENT Provider Note   CSN: [id number] Arrival date & time: 04/20/18  1517     History   Chief Complaint Chief Complaint  Patient presents with  . Flank Pain    HPI Julie [name] is a 42 y.o. female.  HPI Patient presents with right-sided flank pain. Onset was 4 days ago. Since onset pain is been worsening, though the patient has not taken any medication at home for pain relief Pain is sharp, severe, sore, nonradiating. There is associated oliguria, nausea, no abdominal pain. After speaking with her physician today, she started a dose of Cipro, after IM ceftriaxone. She continues to have worsening symptoms in spite of this.  Past Medical History:  Diagnosis Date  . Anemia   . Anemia   . Anxiety   . Breast discharge 07/01/2013   Pt noticed discharge, none on exam today will check TSH and Prolactin  . Breast mass, right 10/20/2012  . Depression   . Dysmenorrhea 09/19/2015  . Dyspareunia in female 09/19/2015  . Ectopic pregnancy   . Eczema   . GERD (gastroesophageal reflux disease)    no meds  . Headache   . Hernia of abdominal wall 01/04/2013  . Kidney stone   . Left breast mass 04/12/2014   Has pea sized nodule at 6 o'clock tender and mobile, will get mammogram and [place] if needed  . Menorrhagia with regular cycle 09/19/2015  . Pelvic pain in female 09/19/2015  . Placenta previa 2012   Current pregnancy   . Urinary tract infection   . Vaginal discharge 07/01/2013    Patient Active Problem List   Diagnosis Date Noted  . S/P hysterectomy 10/10/2015  . Pelvic pain in female 09/19/2015  . Menorrhagia with regular cycle 09/19/2015  . Dyspareunia in female 09/19/2015  . Dysmenorrhea 09/19/2015  . Left breast mass 04/12/2014  . Breast discharge 07/01/2013  . Vaginal discharge 07/01/2013  . Hernia of abdominal wall 01/04/2013  . Breast mass, right 10/20/2012  . Ectopic pregnancy     Past Surgical History:  Procedure Laterality Date  .  ABDOMINAL HYSTERECTOMY N/A 10/10/2015   Procedure: HYSTERECTOMY ABDOMINAL;  Surgeon: Florian Buff, MD;  Location: [facility];  Service: Gynecology;  Laterality: N/A;  . APPENDECTOMY    . APPENDECTOMY    . BILATERAL SALPINGECTOMY Bilateral 10/10/2015   Procedure: BILATERAL SALPINGECTOMY;  Surgeon: Florian Buff, MD;  Location: [facility];  Service: Gynecology;  Laterality: Bilateral;  . BREAST BIOPSY Right   . CESAREAN SECTION  04/24/2011   Procedure: CESAREAN SECTION;  Surgeon: Florian Buff, MD;  Location: [facility];  Service: Gynecology;  Laterality: N/A;  Primary/Previa  . CHOLECYSTECTOMY    . COLONOSCOPY    . CYSTOSCOPY W/ URETERAL STENT PLACEMENT Left 08/04/2014   Procedure: CYSTOSCOPY WITH LEFT  RETROGRADE PYELOGRAM/ LEFT URETERAL STENT PLACEMENT;  Surgeon: Festus Aloe, MD;  Location: [facility];  Service: Urology;  Laterality: Left;  . TONSILLECTOMY    . UPPER GI ENDOSCOPY       OB History    Gravida  2   Para  1   Term  0   Preterm  1   AB  1   Living  1     SAB  0   TAB  0   Ectopic  1   Multiple  0   Live Births  1            Home Medications    Prior to Admission  medications   Medication Sig Start Date End Date Taking? Authorizing Provider  ALPRAZolam Duanne Moron) 1 MG tablet Take 0.5-1 mg by mouth 3 (three) times daily as needed for anxiety.   Yes [provider]  ciprofloxacin (CIPRO) 500 MG tablet Take 500 mg by mouth 2 (two) times daily. 7 day course starting on 04/20/2018 04/20/18  Yes [provider]  levocetirizine (XYZAL) 5 MG tablet TAKE ONE TABLET BY MOUTH EVERY EVENING. Patient taking differently: Take 5 mg by mouth every evening.  03/24/17  Yes Valentina Shaggy, MD  omeprazole (PRILOSEC) 20 MG capsule Take 20 mg by mouth daily.  09/22/15  Yes [provider]  ondansetron (ZOFRAN) 4 MG tablet Take 4 mg by mouth every 8 (eight) hours as needed for nausea or vomiting.  04/20/18  Yes [provider]  sertraline (ZOLOFT)  100 MG tablet Take 100 mg by mouth every morning.   Yes [provider]    Family History Family History  Problem Relation Age of Onset  . Depression Mother   . Cancer Mother   . Seizures Mother   . Bipolar disorder Mother   . Asthma Mother   . Hypertension Father   . Asthma Father   . Heart disease Maternal Grandmother   . Cancer Maternal Grandmother        oral; lymph nodes  . Hypertension Maternal Grandmother   . Other Maternal Grandmother        memory problems  . Stroke Paternal Grandfather   . Hypertension Paternal Grandfather   . Other Paternal Grandfather        brain tumors  . Heart disease Maternal Grandfather   . Pancreatitis Maternal Grandfather   . Cancer Paternal Grandmother        breast  . Heart disease Paternal Grandmother   . Kidney disease Paternal Grandmother        kidney failure  . Diabetes Paternal Grandmother   . Urticaria Son   . Allergic rhinitis Neg Hx   . Angioedema Neg Hx   . Eczema Neg Hx   . Immunodeficiency Neg Hx     Social History Social History   Tobacco Use  . Smoking status: Former Smoker    Years: 14.00    Types: Cigarettes    Last attempt to quit: 09/07/2009    Years since quitting: 8.6  . Smokeless tobacco: Never Used  Substance Use Topics  . Alcohol use: Yes    Comment: occ mixed drink  . Drug use: No     Allergies   Clarithromycin and Topamax [topiramate]   Review of Systems Review of Systems  Constitutional:       Per HPI, otherwise negative  HENT:       Per HPI, otherwise negative  Respiratory:       Per HPI, otherwise negative  Cardiovascular:       Per HPI, otherwise negative  Gastrointestinal: Positive for nausea. Negative for vomiting.  Endocrine:       Negative aside from HPI  Genitourinary:       Neg aside from HPI   Musculoskeletal:       Per HPI, otherwise negative  Skin: Negative.   Neurological: Negative for syncope.     Physical Exam Updated Vital Signs BP 120/77 (BP  Location: Right Arm)   Pulse 91   Temp (!) 97.4 F (36.3 C) (Oral)   Resp 20   Ht 5\' 7"  (1.702 m)   Wt (!) 140.6 kg  LMP 09/26/2015 (Exact Date)   SpO2 96%   BMI 48.55 kg/m   Physical Exam  Constitutional: She is oriented to person, place, and time. She appears well-developed and well-nourished. No distress.  Morbidly obese female awake and alert speaking clearly  HENT:  Head: Normocephalic and atraumatic.  Eyes: Conjunctivae and EOM are normal.  Cardiovascular: Normal rate and regular rhythm.  Pulmonary/Chest: Effort normal and breath sounds normal. No stridor. No respiratory distress.  Abdominal: She exhibits no distension. There is no tenderness. There is no guarding.  Tenderness palpation right flank  Musculoskeletal: She exhibits no edema.  Neurological: She is alert and oriented to person, place, and time. No cranial nerve deficit.  Skin: Skin is warm and dry.  Psychiatric: She has a normal mood and affect.  Nursing note and vitals reviewed.    ED Treatments / Results  Labs (all labs ordered are listed, but only abnormal results are displayed) Labs Reviewed  URINALYSIS, ROUTINE W REFLEX MICROSCOPIC - Abnormal; Notable for the following components:      Result Value   APPearance HAZY (*)    Hgb urine dipstick LARGE (*)    Protein, ur 30 (*)    Leukocytes, UA LARGE (*)    RBC / HPF >50 (*)    WBC, UA >50 (*)    Bacteria, UA RARE (*)    All other components within normal limits    EKG None  Radiology Ct Renal Stone Study  Result Date: 04/20/2018 CLINICAL DATA:  Right flank pain. EXAM: CT ABDOMEN AND PELVIS WITHOUT CONTRAST TECHNIQUE: Multidetector CT imaging of the abdomen and pelvis was performed following the standard protocol without IV contrast. COMPARISON:  CT scan of December 05, 2017. FINDINGS: Lower chest: No acute abnormality. Hepatobiliary: No focal liver abnormality is seen. Status post cholecystectomy. No biliary dilatation. Pancreas: Unremarkable. No  pancreatic ductal dilatation or surrounding inflammatory changes. Spleen: Moderate splenomegaly is noted. Adrenals/Urinary Tract: Adrenal glands appear normal. Nonobstructive calculus is noted in lower pole collecting system of right kidney. No hydronephrosis or renal obstruction is noted. No ureteral calculi are noted. Urinary bladder is decompressed. Stomach/Bowel: Stomach appears normal. Status post appendectomy. There is no evidence of bowel obstruction or inflammation. Vascular/Lymphatic: No significant vascular findings are present. No enlarged abdominal or pelvic lymph nodes. Reproductive: Status post hysterectomy. No adnexal masses. Other: No abdominal wall hernia or abnormality. No abdominopelvic ascites. Musculoskeletal: No acute or significant osseous findings. IMPRESSION: Nonobstructive right renal calculus. No hydronephrosis or renal obstruction is noted. Moderate splenomegaly. Electronically Signed   By: Marijo Conception, M.D.   On: 04/20/2018 19:07    Procedures Procedures (including critical care time)  Medications Ordered in ED Medications  sodium chloride 0.9 % bolus 1,000 mL (0 mLs Intravenous Stopped 04/20/18 1927)  ketorolac (TORADOL) 30 MG/ML injection 30 mg (30 mg Intravenous Given 04/20/18 1657)  ondansetron (ZOFRAN) injection 4 mg (4 mg Intravenous Given 04/20/18 1657)     Initial Impression / Assessment and Plan / ED Course  I have reviewed the triage vital signs and the nursing notes.  Pertinent labs & imaging results that were available during my care of the patient were reviewed by me and considered in my medical decision making (see chart for details).     7:46 PM Patient in no distress, awake, alert, feels substantially better. We discussed the findings including CT results, urinalysis. She has already received 1 dose of IM ceftriaxone, has a prescription for Cipro, Azo, will pick up with both of  these, will follow up with urology given the presentation  consistent with UTI, complicated by small, nonobstructive stone No evidence for bacteremia, sepsis.   Final Clinical Impressions(s) / ED Diagnoses  Nephrolithiasis UTI   Carmin Muskrat, MD 04/20/18 1947

## 2018-04-22 ENCOUNTER — Other Ambulatory Visit: Payer: Self-pay | Admitting: Family Medicine

## 2018-04-22 DIAGNOSIS — Z1231 Encounter for screening mammogram for malignant neoplasm of breast: Secondary | ICD-10-CM

## 2018-04-26 DIAGNOSIS — N342 Other urethritis: Secondary | ICD-10-CM | POA: Diagnosis not present

## 2018-04-28 DIAGNOSIS — M9902 Segmental and somatic dysfunction of thoracic region: Secondary | ICD-10-CM | POA: Diagnosis not present

## 2018-04-28 DIAGNOSIS — M9901 Segmental and somatic dysfunction of cervical region: Secondary | ICD-10-CM | POA: Diagnosis not present

## 2018-04-28 DIAGNOSIS — M546 Pain in thoracic spine: Secondary | ICD-10-CM | POA: Diagnosis not present

## 2018-04-28 DIAGNOSIS — G44219 Episodic tension-type headache, not intractable: Secondary | ICD-10-CM | POA: Diagnosis not present

## 2018-05-03 DIAGNOSIS — N200 Calculus of kidney: Secondary | ICD-10-CM | POA: Diagnosis not present

## 2018-05-03 DIAGNOSIS — N3021 Other chronic cystitis with hematuria: Secondary | ICD-10-CM | POA: Diagnosis not present

## 2018-05-03 DIAGNOSIS — R3915 Urgency of urination: Secondary | ICD-10-CM | POA: Diagnosis not present

## 2018-05-11 ENCOUNTER — Telehealth: Payer: Self-pay | Admitting: Neurology

## 2018-05-11 NOTE — Telephone Encounter (Signed)
She was started on the Botox in October.  This is supposed to help reduce risk of headache recurrence.

## 2018-05-11 NOTE — Telephone Encounter (Signed)
Patient wanted to let Dr. Tomi Likens know that she has not had any Migraines until last week and she had 3 that week. She is wanting to know is there anything she can do or take so she does not start having them daily again? Please Call. Thanks

## 2018-05-12 NOTE — Telephone Encounter (Signed)
Called and spoke with Pt. She will be unable to have Botox any further, her insurance is changing and her copay and deductible are going to be unaffordable. She wants to be put on something else since she won't be able to afford Botox.. Made appointment to discuss options

## 2018-05-13 ENCOUNTER — Ambulatory Visit: Payer: BLUE CROSS/BLUE SHIELD | Admitting: Neurology

## 2018-05-13 ENCOUNTER — Encounter: Payer: Self-pay | Admitting: Neurology

## 2018-05-13 ENCOUNTER — Ambulatory Visit (INDEPENDENT_AMBULATORY_CARE_PROVIDER_SITE_OTHER): Payer: BLUE CROSS/BLUE SHIELD | Admitting: Neurology

## 2018-05-13 VITALS — BP 112/86 | HR 83 | Ht 67.0 in | Wt 311.0 lb

## 2018-05-13 DIAGNOSIS — G43709 Chronic migraine without aura, not intractable, without status migrainosus: Secondary | ICD-10-CM

## 2018-05-13 MED ORDER — TIZANIDINE HCL 4 MG PO TABS: ORAL_TABLET | ORAL | 3 refills | Status: DC

## 2018-05-13 MED ORDER — ERGOTAMINE-CAFFEINE 1-100 MG PO TABS: ORAL_TABLET | ORAL | 3 refills | Status: DC

## 2018-05-13 MED ORDER — ERENUMAB-AOOE 70 MG/ML ~~LOC~~ SOAJ: 70.0000 mg | SUBCUTANEOUS | 0 refills | Status: DC

## 2018-05-13 NOTE — Progress Notes (Signed)
NEUROLOGY FOLLOW UP OFFICE NOTE  Julie [name] [id number]  HISTORY OF PRESENT ILLNESS: Julie [name] is a 42 year old female with depression, anxiety and kidney stone who follows up for migraine.  UPDATE: She had her first round of Botox on 04/14/2018.  She had 3 weeks without headaches and then she developed 3 severe migraines over past week, current migraine is on day 3.  However, her insurance is changing and her co-pay and deductible are going to be unaffordable. Current NSAIDS: None Current analgesics: Excedrin Current triptans: None Current ergotamine: None Current anti-emetic: None Current muscle relaxants: None Current anti-anxiolytic: Alprazolam 1 mg as needed Current sleep aide: None Current Antihypertensive medications: None Current Antidepressant medications: Sertraline 100 mg daily Current Anticonvulsant medications: None Current anti-CGRP: None Current Vitamins/Herbal/Supplements: None Current Antihistamines/Decongestants: None Other therapy: Daith piercing bilaterally, chiropractor Hormone/birth control: None  Caffeine: One cup of coffee daily, tea at dinner Alcohol: Occasional Smoker: No Diet: 4 glasses of water daily; does not eat breakfast. Exercise: Not routine Depression: Yes; Anxiety: Yes Other pain: Diffuse body aches Sleep hygiene: Sleeps but not rested.  Sleep study negative for OSA.  HISTORY: Onset: She developed migraines in high school but resolved.  Migraines returned at age 11 years old after birth of her son. Location:  Back of head bilaterally, top of head, radiating down neck into shoulders Quality:  Vice-grip Initial intensity:  5-10/10 (usually 10/10).  She denies new headache, thunderclap headache Aura:  Sometimes phantosmia (chemicals, exhaust, burning) Prodrome:  no Postdrome:  fatigue Associated symptoms: Nausea, photophobia, phonophobia, blurred vision.  She denies associated unilateral numbness or weakness. Initial duration:   Constant. Often wakes up with it. Initial Frequency:  daily Initial Frequency of abortive medication: none Triggers: None Relieving factors:  Applying pressure to upper paraspinal region, resting in dark and quiet place Activity:  aggravates  Workup included MRI of brain without contrast from 10//19/16, which was personally reviewed and was unremarkable.  Past NSAIDS:  Ibuprofen, naproxen, indomethacin 50mg  three times daily, Toradol shot (helps) Past analgesics:  Fioricet with codeine, Excedrin, Tylenol Past abortive triptans:  Sumatriptan 100mg  (made her sick), rizatriptan 10mg , eletritan 40mg  Past abortive ergotamine:  No Past muscle relaxants:  Flexeril Past anti-emetic:  Zofran (sometimes helps) Past antihypertensive medications:  Propranolol 80mg  twice daily Past antidepressant medications:  Amitriptyline 25mg  at bedtime, Wellbutrin Past anticonvulsant medications:  topiramate 150mg , zonisamide 25mg  at bedtime Past anti-CGRP:  none Past vitamins/Herbal/Supplements:  none Past antihistamines/decongestants:  none Other past therapies:  Trigger point/nerve blocks, cognitive behavioral therapy, acupuncture, biofeedback, Botox (1 round, stopped due to cost related to change in insurance).  Family history of headache:  No  PAST MEDICAL HISTORY: Past Medical History:  Diagnosis Date  . Anemia   . Anemia   . Anxiety   . Breast discharge 07/01/2013   Pt noticed discharge, none on exam today will check TSH and Prolactin  . Breast mass, right 10/20/2012  . Depression   . Dysmenorrhea 09/19/2015  . Dyspareunia in female 09/19/2015  . Ectopic pregnancy   . Eczema   . GERD (gastroesophageal reflux disease)    no meds  . Headache   . Hernia of abdominal wall 01/04/2013  . Kidney stone   . Left breast mass 04/12/2014   Has pea sized nodule at 6 o'clock tender and mobile, will get mammogram and [place] if needed  . Menorrhagia with regular cycle 09/19/2015  . Pelvic pain in female 09/19/2015    . Placenta previa 2012   Current pregnancy   .  Urinary tract infection   . Vaginal discharge 07/01/2013    MEDICATIONS: Current Outpatient Medications on File Prior to Visit  Medication Sig Dispense Refill  . ALPRAZolam (XANAX) 1 MG tablet Take 0.5-1 mg by mouth 3 (three) times daily as needed for anxiety.    . ciprofloxacin (CIPRO) 500 MG tablet Take 500 mg by mouth 2 (two) times daily. 7 day course starting on 04/20/2018  0  . levocetirizine (XYZAL) 5 MG tablet TAKE ONE TABLET BY MOUTH EVERY EVENING. (Patient taking differently: Take 5 mg by mouth every evening. ) 30 tablet 1  . omeprazole (PRILOSEC) 20 MG capsule Take 20 mg by mouth daily.   4  . ondansetron (ZOFRAN) 4 MG tablet Take 4 mg by mouth every 8 (eight) hours as needed for nausea or vomiting.   0  . sertraline (ZOLOFT) 100 MG tablet Take 100 mg by mouth every morning.     No current facility-administered medications on file prior to visit.     ALLERGIES: Allergies  Allergen Reactions  . Clarithromycin Other (See Comments)    Makes throat raw  . Topamax [Topiramate] Itching    FAMILY HISTORY: Family History  Problem Relation Age of Onset  . Depression Mother   . Cancer Mother   . Seizures Mother   . Bipolar disorder Mother   . Asthma Mother   . Hypertension Father   . Asthma Father   . Heart disease Maternal Grandmother   . Cancer Maternal Grandmother        oral; lymph nodes  . Hypertension Maternal Grandmother   . Other Maternal Grandmother        memory problems  . Stroke Paternal Grandfather   . Hypertension Paternal Grandfather   . Other Paternal Grandfather        brain tumors  . Heart disease Maternal Grandfather   . Pancreatitis Maternal Grandfather   . Cancer Paternal Grandmother        breast  . Heart disease Paternal Grandmother   . Kidney disease Paternal Grandmother        kidney failure  . Diabetes Paternal Grandmother   . Urticaria Son   . Allergic rhinitis Neg Hx   . Angioedema Neg  Hx   . Eczema Neg Hx   . Immunodeficiency Neg Hx    SOCIAL HISTORY: Social History   Socioeconomic History  . Marital status: Married    Spouse name: Not on file  . Number of children: Not on file  . Years of education: Not on file  . Highest education level: Not on file  Occupational History  . Not on file  Social Needs  . Financial resource strain: Not on file  . Food insecurity:    Worry: Not on file    Inability: Not on file  . Transportation needs:    Medical: Not on file    Non-medical: Not on file  Tobacco Use  . Smoking status: Former Smoker    Years: 14.00    Types: Cigarettes    Last attempt to quit: 09/07/2009    Years since quitting: 8.6  . Smokeless tobacco: Never Used  Substance and Sexual Activity  . Alcohol use: Yes    Comment: occ mixed drink  . Drug use: No  . Sexual activity: Yes    Birth control/protection: Surgical    Comment: hyst  Lifestyle  . Physical activity:    Days per week: Not on file    Minutes per session: Not on file  .  Stress: Not on file  Relationships  . Social connections:    Talks on phone: Not on file    Gets together: Not on file    Attends religious service: Not on file    Active member of club or organization: Not on file    Attends meetings of clubs or organizations: Not on file    Relationship status: Not on file  . Intimate partner violence:    Fear of current or ex partner: Not on file    Emotionally abused: Not on file    Physically abused: Not on file    Forced sexual activity: Not on file  Other Topics Concern  . Not on file  Social History Narrative  . Not on file    REVIEW OF SYSTEMS: Constitutional: No fevers, chills, or sweats, no generalized fatigue, change in appetite Eyes: No visual changes, double vision, eye pain Ear, nose and throat: No hearing loss, ear pain, nasal congestion, sore throat Cardiovascular: No chest pain, palpitations Respiratory:  No shortness of breath at rest or with exertion,  wheezes GastrointestinaI: No nausea, vomiting, diarrhea, abdominal pain, fecal incontinence Genitourinary:  No dysuria, urinary retention or frequency Musculoskeletal:  No neck pain, back pain Integumentary: No rash, pruritus, skin lesions Neurological: as above Psychiatric: No depression, insomnia, anxiety Endocrine: No palpitations, fatigue, diaphoresis, mood swings, change in appetite, change in weight, increased thirst Hematologic/Lymphatic:  No purpura, petechiae. Allergic/Immunologic: no itchy/runny eyes, nasal congestion, recent allergic reactions, rashes  PHYSICAL EXAM: Blood pressure 112/86, pulse 83, height 5\' 7"  (1.702 m), weight (!) 311 lb (141.1 kg), last menstrual period 09/26/2015, SpO2 98 %. General: No acute distress.  Patient appears well-groomed.   Head:  Normocephalic/atraumatic Eyes:  Fundi examined but not visualized Neck: supple, no paraspinal tenderness, full range of motion Heart:  Regular rate and rhythm Lungs:  Clear to auscultation bilaterally Back: No paraspinal tenderness Neurological Exam: alert and oriented to person, place, and time. Attention span and concentration intact, recent and remote memory intact, fund of knowledge intact.  Speech fluent and not dysarthric, language intact.  CN II-XII intact. Bulk and tone normal, muscle strength 5/5 throughout.  Sensation to light touch  intact.  Deep tendon reflexes 2+ throughout, toes downgoing.  Finger to nose testing intact.  Gait normal, Romberg negative.  IMPRESSION: 1.  Chronic migraine without (sometimes with) aura, without status migrainosus, intractable 2.  Depression and anxiety 3.  Morbid obesity (BMI 48.71)  PLAN: 1.  For preventative management, will start Aimovig 70mg  monthly 2.  For abortive therapy, will try Cafergot. 3.  Limit use of pain relievers to no more than 2 days out of week to prevent risk of rebound or medication-overuse headache. 4. Tizanidine 2 to 4mg  at bedtime for neck/shoulder  tightness  5. Keep headache diary 6.  Exercise, diet/weight loss, hydration, caffeine cessation, sleep hygiene, monitor for and avoid triggers 7.  Consider:  magnesium citrate 400mg  daily, riboflavin 400mg  daily, and coenzyme Q10 100mg  three times daily 8.  Follow up in March as already scheduled.    Metta Clines, DO  CC: Sharilyn Sites, MD

## 2018-05-13 NOTE — Patient Instructions (Addendum)
1.  For preventative management, will start Aimovig 70mg  injection monthly 2.  When you get a migraine, take Cafergot 1 to 2 tablets every 1 hour up to 4 doses in 24 hours, limit to no more than 2 days out of the week. 3.  Limit use of pain relievers to no more than 2 days out of week to prevent risk of rebound or medication-overuse headache. 4. Take tizanidine 4mg  (1/2 to 1 tablet at bedtime)  5. Keep headache diary 6.  Exercise, hydration, caffeine cessation, sleep hygiene, monitor for and avoid triggers 7.  Consider:  magnesium citrate 400mg  daily, riboflavin 400mg  daily, and coenzyme Q10 100mg  three times daily 8.  Follow up in March as already scheduled (may cancel Botox appointment in February)  We are providing you with 3 samples of Aimovig 70 mg auto injectors. When you have used the last sample, contact us and we will send in a prescription to your pharmacy.

## 2018-05-14 ENCOUNTER — Ambulatory Visit: Payer: BLUE CROSS/BLUE SHIELD | Admitting: Neurology

## 2018-05-18 ENCOUNTER — Telehealth: Payer: Self-pay

## 2018-05-18 NOTE — Telephone Encounter (Signed)
Rcvd fax from [place], Cafergot not available. Called several pharmacies, found it at [facility] at [facility] in [place], spoke with [place]. He had a b ottle with 90 in it, that was all he had, gave him verbal. Called Pt advised her.

## 2018-05-21 NOTE — Progress Notes (Signed)
**  LATE DOCUMENTATION**  Prior Authorization initiated on 05/19/2018 via [email or web address] for pt's  Ergotamine-Caffeine 1-100mg  Tab

## 2018-05-21 NOTE — Progress Notes (Signed)
Received notice from [email or web address] that pt's Ergotamine-Caffeine 1/100mg  Tab has been   Approved 05/19/18 thru 05/17/2021 with the following message:  3 year NF Med

## 2018-05-24 ENCOUNTER — Telehealth: Payer: Self-pay | Admitting: Neurology

## 2018-05-24 NOTE — Telephone Encounter (Signed)
Patient is needing to see if her medication has had a Prior Auth? Please Call. Thanks

## 2018-05-24 NOTE — Telephone Encounter (Signed)
Called Pt, LMOVM advising Cafergot had been approved

## 2018-05-27 DIAGNOSIS — M7662 Achilles tendinitis, left leg: Secondary | ICD-10-CM | POA: Diagnosis not present

## 2018-06-03 DIAGNOSIS — Z1389 Encounter for screening for other disorder: Secondary | ICD-10-CM | POA: Diagnosis not present

## 2018-06-03 DIAGNOSIS — J3089 Other allergic rhinitis: Secondary | ICD-10-CM | POA: Diagnosis not present

## 2018-06-03 DIAGNOSIS — Z6841 Body Mass Index (BMI) 40.0 and over, adult: Secondary | ICD-10-CM | POA: Diagnosis not present

## 2018-06-03 DIAGNOSIS — F4320 Adjustment disorder, unspecified: Secondary | ICD-10-CM | POA: Diagnosis not present

## 2018-06-04 ENCOUNTER — Ambulatory Visit
Admission: RE | Admit: 2018-06-04 | Discharge: 2018-06-04 | Disposition: A | Discharge status: Home / Self Care | Payer: BLUE CROSS/BLUE SHIELD | Source: Ambulatory Visit | Attending: Family Medicine | Admitting: Family Medicine

## 2018-06-04 DIAGNOSIS — M9902 Segmental and somatic dysfunction of thoracic region: Secondary | ICD-10-CM | POA: Diagnosis not present

## 2018-06-04 DIAGNOSIS — G44219 Episodic tension-type headache, not intractable: Secondary | ICD-10-CM | POA: Diagnosis not present

## 2018-06-04 DIAGNOSIS — Z1231 Encounter for screening mammogram for malignant neoplasm of breast: Secondary | ICD-10-CM | POA: Diagnosis not present

## 2018-06-04 DIAGNOSIS — M546 Pain in thoracic spine: Secondary | ICD-10-CM | POA: Diagnosis not present

## 2018-06-04 DIAGNOSIS — M9901 Segmental and somatic dysfunction of cervical region: Secondary | ICD-10-CM | POA: Diagnosis not present

## 2018-07-05 ENCOUNTER — Telehealth: Payer: Self-pay | Admitting: Neurology

## 2018-07-05 NOTE — Telephone Encounter (Signed)
Maybe we can try Roselyn Meier

## 2018-07-05 NOTE — Telephone Encounter (Signed)
Patient was needing to let Dr. Tomi Likens know that she had a Migraine on Saturday so she took her breakthrough medication. She said she felt her throat closing up and having a hard time swallowing. She continued to have a Migraine but was scared to take any more of the medication. She wanted to know if that was a normal side effect? Please Call. Thanks

## 2018-07-05 NOTE — Telephone Encounter (Signed)
Called and spoke with Pt. Advised her of Ubrelvy. She is to come p/u samples. She mentioned her throat still feels as if it has a lump in it, though is better, I suggested she take benadryl or Zyrtec this evening to help with that. She will let us know if the samples are effective of Ubrelvy, and if so, we will send in Rx.

## 2018-07-05 NOTE — Telephone Encounter (Signed)
Called and spoke with Pt. Confirmed it was the Cafergot she had taken. She states she was able to check her o2 during this and it never dropped below 97%. Today she still feels like there is a lump in her throat, but no problems breathing. I advised her not to take any more of the medication and I will call her back with recommendations.

## 2018-07-13 ENCOUNTER — Telehealth: Payer: Self-pay | Admitting: Neurology

## 2018-07-13 NOTE — Telephone Encounter (Signed)
Will call Pt and remind her about the copay card. As long as she has Pharmacist, community, she will receive Aimovig for $5. Will also give her the contact information for [facility].

## 2018-07-13 NOTE — Telephone Encounter (Signed)
Patient called and wanted to Cancel her Botox appointment on Thursday 07/15/18. She said that she cannot afford her Botox out of pocket,  even though it is helping. She also said the co pay is so expensive as well. She said Aimovig is not covered through her Insurance. She was also asking about Doubling up on her Aimovig? She was unsure of how long she could use the samples? Please Call. Thanks

## 2018-07-14 NOTE — Telephone Encounter (Signed)
Called and LMOVM advising Pt of [email or web address] and she would get Aiomovig for $5 a month for 12 months. I advised her to discuss any increase at her March OV

## 2018-07-15 ENCOUNTER — Ambulatory Visit: Payer: BLUE CROSS/BLUE SHIELD | Admitting: Neurology

## 2018-07-15 DIAGNOSIS — G43909 Migraine, unspecified, not intractable, without status migrainosus: Secondary | ICD-10-CM | POA: Diagnosis not present

## 2018-07-15 DIAGNOSIS — Z6841 Body Mass Index (BMI) 40.0 and over, adult: Secondary | ICD-10-CM | POA: Diagnosis not present

## 2018-07-15 DIAGNOSIS — Z1389 Encounter for screening for other disorder: Secondary | ICD-10-CM | POA: Diagnosis not present

## 2018-07-15 DIAGNOSIS — F329 Major depressive disorder, single episode, unspecified: Secondary | ICD-10-CM | POA: Diagnosis not present

## 2018-08-02 DIAGNOSIS — H109 Unspecified conjunctivitis: Secondary | ICD-10-CM | POA: Diagnosis not present

## 2018-08-02 DIAGNOSIS — Z1389 Encounter for screening for other disorder: Secondary | ICD-10-CM | POA: Diagnosis not present

## 2018-08-02 DIAGNOSIS — Z6841 Body Mass Index (BMI) 40.0 and over, adult: Secondary | ICD-10-CM | POA: Diagnosis not present

## 2018-08-02 DIAGNOSIS — N342 Other urethritis: Secondary | ICD-10-CM | POA: Diagnosis not present

## 2018-08-11 ENCOUNTER — Other Ambulatory Visit: Payer: Self-pay | Admitting: Neurology

## 2018-08-30 ENCOUNTER — Ambulatory Visit: Payer: BLUE CROSS/BLUE SHIELD | Admitting: Neurology

## 2018-09-27 ENCOUNTER — Encounter: Payer: Self-pay | Admitting: *Deleted

## 2018-09-27 NOTE — Progress Notes (Signed)
Received from [facility] [phone] [facility]#[id number] Effective 04/06/2018-03/10/2019

## 2018-10-15 ENCOUNTER — Other Ambulatory Visit: Payer: Self-pay | Admitting: Neurology

## 2018-10-18 ENCOUNTER — Other Ambulatory Visit: Payer: Self-pay | Admitting: Neurology

## 2018-10-18 DIAGNOSIS — Z6841 Body Mass Index (BMI) 40.0 and over, adult: Secondary | ICD-10-CM | POA: Diagnosis not present

## 2018-10-18 DIAGNOSIS — Z1389 Encounter for screening for other disorder: Secondary | ICD-10-CM | POA: Diagnosis not present

## 2018-10-18 DIAGNOSIS — M7989 Other specified soft tissue disorders: Secondary | ICD-10-CM | POA: Diagnosis not present

## 2019-01-03 ENCOUNTER — Other Ambulatory Visit: Payer: Self-pay | Admitting: Family Medicine

## 2019-01-03 ENCOUNTER — Other Ambulatory Visit (HOSPITAL_COMMUNITY): Payer: Self-pay | Admitting: Family Medicine

## 2019-01-03 ENCOUNTER — Other Ambulatory Visit: Payer: Self-pay

## 2019-01-03 ENCOUNTER — Ambulatory Visit (HOSPITAL_COMMUNITY)
Admission: RE | Admit: 2019-01-03 | Discharge: 2019-01-03 | Disposition: A | Discharge status: Home / Self Care | Payer: BC Managed Care – PPO | Source: Ambulatory Visit | Attending: Family Medicine | Admitting: Family Medicine

## 2019-01-03 DIAGNOSIS — N200 Calculus of kidney: Secondary | ICD-10-CM | POA: Diagnosis not present

## 2019-01-03 DIAGNOSIS — N202 Calculus of kidney with calculus of ureter: Secondary | ICD-10-CM | POA: Insufficient documentation

## 2019-01-03 DIAGNOSIS — R109 Unspecified abdominal pain: Secondary | ICD-10-CM | POA: Insufficient documentation

## 2019-01-03 DIAGNOSIS — Z6841 Body Mass Index (BMI) 40.0 and over, adult: Secondary | ICD-10-CM | POA: Diagnosis not present

## 2019-01-04 DIAGNOSIS — N200 Calculus of kidney: Secondary | ICD-10-CM | POA: Diagnosis not present

## 2019-01-04 DIAGNOSIS — R1084 Generalized abdominal pain: Secondary | ICD-10-CM | POA: Diagnosis not present

## 2019-01-04 DIAGNOSIS — R948 Abnormal results of function studies of other organs and systems: Secondary | ICD-10-CM | POA: Diagnosis not present

## 2019-01-05 ENCOUNTER — Telehealth: Payer: Self-pay | Admitting: Obstetrics & Gynecology

## 2019-01-05 NOTE — Telephone Encounter (Signed)
Called patient back. She was adamant that she needed to be seen asap. She is in a lot of pain and said she has a mass on her ovary. Found appt with Dr. Rip Harbour for Monday. Patient appreciative.

## 2019-01-05 NOTE — Telephone Encounter (Signed)
Pt states that the cyst she had on her ovary last year has came back and is bigger and she is wanting to see someone in the office. Request to not see Eure. Please advise.

## 2019-01-05 NOTE — Telephone Encounter (Signed)
Pt states that her PCP stated she should not be waiting 2 weeks to be seen as she has a mass growing in her. Offered pt to be seen next week, pt states she will be on vacation. Advised pt that I do not have any openings for this week. Pt request to speak with a nurse.

## 2019-01-10 ENCOUNTER — Encounter: Payer: Self-pay | Admitting: Obstetrics and Gynecology

## 2019-01-10 ENCOUNTER — Ambulatory Visit: Payer: BC Managed Care – PPO | Admitting: Obstetrics and Gynecology

## 2019-01-10 ENCOUNTER — Other Ambulatory Visit: Payer: Self-pay

## 2019-01-10 DIAGNOSIS — N83202 Unspecified ovarian cyst, left side: Secondary | ICD-10-CM

## 2019-01-10 DIAGNOSIS — N83209 Unspecified ovarian cyst, unspecified side: Secondary | ICD-10-CM | POA: Insufficient documentation

## 2019-01-10 NOTE — Patient Instructions (Signed)
Ovarian Cyst An ovarian cyst is a fluid-filled sac on an ovary. The ovaries are organs that make eggs in women. Most ovarian cysts go away on their own and are not cancerous (are benign). Some cysts need treatment. Follow these instructions at home:  Take over-the-counter and prescription medicines only as told by your doctor.  Do not drive or use heavy machinery while taking prescription pain medicine.  Get pelvic exams and Pap tests as often as told by your doctor.  Return to your normal activities as told by your doctor. Ask your doctor what activities are safe for you.  Do not use any products that contain nicotine or tobacco, such as cigarettes and e-cigarettes. If you need help quitting, ask your doctor.  Keep all follow-up visits as told by your doctor. This is important. Contact a doctor if:  Your periods are: ? Late. ? Irregular. ? Painful.   Your periods stop.  You have pelvic pain that does not go away.  You have pressure on your bladder.  You have trouble making your bladder empty when you pee (urinate).  You have pain during sex.  You have any of the following in your belly (abdomen): ? A feeling of fullness. ? Pressure. ? Discomfort. ? Pain that does not go away. ? Swelling.  You feel sick most of the time.  You have trouble pooping (have constipation).  You are not as hungry as usual (you lose your appetite).  You get very bad acne.  You start to have more hair on your body and face.  You are gaining weight or losing weight without changing your exercise and eating habits.  You think you may be pregnant. Get help right away if:  You have belly pain that is very bad or gets worse.  You cannot eat or drink without throwing up (vomiting).  You suddenly get a fever.  Your period is a lot heavier than usual. This information is not intended to replace advice given to you by your health care provider. Make sure you discuss any questions you have  with your health care provider. Document Released: 11/12/2007 Document Revised: 05/08/2017 Document Reviewed: 10/28/2015 Elsevier Patient Education  2020 Elsevier Inc.  

## 2019-01-10 NOTE — Addendum Note (Signed)
Addended by: Chancy Milroy on: 01/10/2019 09:28 AM   Modules accepted: Orders

## 2019-01-10 NOTE — Progress Notes (Signed)
Julie [name] presents for possible left ovarain cyst noted on CT scan. H/O of the same with resolution last year.  H/O Hyst  PE AF VSS Lungs clear Heart RRR Abd soft + BS obese  A/P Possible left ovarian cyst  Reviewed with pt. Will check GYN U/S F/U as per GYN U/S results

## 2019-01-11 ENCOUNTER — Telehealth: Payer: Self-pay | Admitting: *Deleted

## 2019-01-11 ENCOUNTER — Ambulatory Visit (HOSPITAL_COMMUNITY)
Admission: RE | Admit: 2019-01-11 | Discharge: 2019-01-11 | Disposition: A | Discharge status: Home / Self Care | Payer: BC Managed Care – PPO | Source: Ambulatory Visit | Attending: Obstetrics and Gynecology | Admitting: Obstetrics and Gynecology

## 2019-01-11 DIAGNOSIS — N83202 Unspecified ovarian cyst, left side: Secondary | ICD-10-CM | POA: Diagnosis not present

## 2019-01-11 DIAGNOSIS — N838 Other noninflammatory disorders of ovary, fallopian tube and broad ligament: Secondary | ICD-10-CM | POA: Diagnosis not present

## 2019-01-11 NOTE — Telephone Encounter (Signed)
Can discuss at follow up appt after next U/S Thanks Mihcael

## 2019-01-11 NOTE — Telephone Encounter (Signed)
Patient informed U/S showed probable ovarian cyst and needs f/u U/S in 6-8 weeks.  Pt verbalized understanding but is questioning why she continues to have these as she just had one last year. She would like to know if other tests need to be ordered for evaluation of this. Please advise.

## 2019-01-12 ENCOUNTER — Telehealth: Payer: Self-pay | Admitting: *Deleted

## 2019-01-12 NOTE — Telephone Encounter (Signed)
LMOVM letting patient know that Dr Rip Harbour states further questions and options can be discussed at follow up appt after next U/S.

## 2019-01-20 DIAGNOSIS — R51 Headache: Secondary | ICD-10-CM | POA: Diagnosis not present

## 2019-01-20 DIAGNOSIS — Z6841 Body Mass Index (BMI) 40.0 and over, adult: Secondary | ICD-10-CM | POA: Diagnosis not present

## 2019-01-21 ENCOUNTER — Ambulatory Visit: Payer: BC Managed Care – PPO | Admitting: Obstetrics and Gynecology

## 2019-02-01 ENCOUNTER — Other Ambulatory Visit: Payer: Self-pay

## 2019-02-01 DIAGNOSIS — R6889 Other general symptoms and signs: Secondary | ICD-10-CM | POA: Diagnosis not present

## 2019-02-01 DIAGNOSIS — Z20822 Contact with and (suspected) exposure to covid-19: Secondary | ICD-10-CM

## 2019-02-02 LAB — NOVEL CORONAVIRUS, NAA: SARS-CoV-2, NAA: NOT DETECTED

## 2019-02-03 NOTE — Progress Notes (Signed)
Please let patient know covid was negative

## 2019-02-08 ENCOUNTER — Other Ambulatory Visit: Payer: Self-pay | Admitting: Neurology

## 2019-02-09 ENCOUNTER — Other Ambulatory Visit: Payer: Self-pay

## 2019-02-09 NOTE — Telephone Encounter (Signed)
Patient needs to make an appointment first (may be virtual visit)

## 2019-02-09 NOTE — Telephone Encounter (Signed)
Left message that medication was denied, Needs to be seen. Message left.

## 2019-02-21 ENCOUNTER — Other Ambulatory Visit: Payer: Self-pay | Admitting: Obstetrics and Gynecology

## 2019-02-21 DIAGNOSIS — N83202 Unspecified ovarian cyst, left side: Secondary | ICD-10-CM

## 2019-02-22 ENCOUNTER — Other Ambulatory Visit: Payer: Self-pay

## 2019-02-22 ENCOUNTER — Ambulatory Visit (INDEPENDENT_AMBULATORY_CARE_PROVIDER_SITE_OTHER): Payer: BC Managed Care – PPO

## 2019-02-22 DIAGNOSIS — N83202 Unspecified ovarian cyst, left side: Secondary | ICD-10-CM

## 2019-02-22 NOTE — Progress Notes (Signed)
PELVIC TA/TV: normal vaginal cuff,normal left ovary,simple right ovarian cyst 2.6 x 2.9 x 2.8 cm,no free fluid,pelvic pain during ultrasound

## 2019-02-23 ENCOUNTER — Ambulatory Visit: Payer: BC Managed Care – PPO | Admitting: Obstetrics and Gynecology

## 2019-02-23 ENCOUNTER — Encounter: Payer: Self-pay | Admitting: Obstetrics and Gynecology

## 2019-02-23 VITALS — Ht 67.0 in | Wt 307.0 lb

## 2019-02-23 DIAGNOSIS — N83209 Unspecified ovarian cyst, unspecified side: Secondary | ICD-10-CM | POA: Diagnosis not present

## 2019-02-23 NOTE — Progress Notes (Signed)
Patient ID: Julie [name], female   DOB: [birth year], 43 y.o.   MRN: [id number] [name] presents for discuss of U/S completed yesterday. Pt had U/S in August with left ovarian cyst. U/S yesterday showed resolution of left ovarian cyst and small right ovarian cyst. Results reviewed with pt.  PE AF VSS Lungs clear Heart RRR Abd soft + BS obese  A/P Ovarian cyst  Pt reassured in regards to ovarian cysts as most likely physiological in nature F/U PRN

## 2019-03-24 DIAGNOSIS — Z23 Encounter for immunization: Secondary | ICD-10-CM | POA: Diagnosis not present

## 2019-03-25 DIAGNOSIS — G43709 Chronic migraine without aura, not intractable, without status migrainosus: Secondary | ICD-10-CM | POA: Diagnosis not present

## 2019-04-01 DIAGNOSIS — J069 Acute upper respiratory infection, unspecified: Secondary | ICD-10-CM | POA: Diagnosis not present

## 2019-04-01 DIAGNOSIS — Z6841 Body Mass Index (BMI) 40.0 and over, adult: Secondary | ICD-10-CM | POA: Diagnosis not present

## 2019-04-08 ENCOUNTER — Other Ambulatory Visit: Payer: Self-pay

## 2019-04-08 DIAGNOSIS — Z20822 Contact with and (suspected) exposure to covid-19: Secondary | ICD-10-CM

## 2019-04-09 LAB — NOVEL CORONAVIRUS, NAA: SARS-CoV-2, NAA: NOT DETECTED

## 2019-04-11 ENCOUNTER — Telehealth: Payer: Self-pay | Admitting: Family Medicine

## 2019-04-11 NOTE — Telephone Encounter (Signed)
Advised with Patient's husband that patients COVID test was negative. Expressed understanding.

## 2019-05-13 DIAGNOSIS — G43909 Migraine, unspecified, not intractable, without status migrainosus: Secondary | ICD-10-CM | POA: Diagnosis not present

## 2019-09-21 ENCOUNTER — Encounter: Payer: Self-pay | Admitting: Women's Health

## 2019-09-21 ENCOUNTER — Ambulatory Visit (INDEPENDENT_AMBULATORY_CARE_PROVIDER_SITE_OTHER): Payer: 59 | Admitting: Women's Health

## 2019-09-21 ENCOUNTER — Other Ambulatory Visit: Payer: Self-pay

## 2019-09-21 VITALS — BP 140/89 | HR 77 | Ht 67.0 in | Wt 310.6 lb

## 2019-09-21 DIAGNOSIS — Z90710 Acquired absence of both cervix and uterus: Secondary | ICD-10-CM | POA: Diagnosis not present

## 2019-09-21 DIAGNOSIS — Z8742 Personal history of other diseases of the female genital tract: Secondary | ICD-10-CM | POA: Diagnosis not present

## 2019-09-21 DIAGNOSIS — R1032 Left lower quadrant pain: Secondary | ICD-10-CM

## 2019-09-21 MED ORDER — PROMETHAZINE HCL 12.5 MG PO TABS: 12.5000 mg | ORAL_TABLET | Freq: Four times a day (QID) | ORAL | 0 refills | Status: DC | PRN

## 2019-09-21 NOTE — Progress Notes (Signed)
   GYN VISIT Patient name: Julie [name] MRN [id number]  Date of birth: [birth year] Chief Complaint:   pain in left ovary  History of Present Illness:   Faithanne Browers is a 44 y.o. G2P0111 Caucasian female being seen today for report of sudden onset sharp constant LLQ pain Sunday. Bending makes it worse. Can feel a knot where it hurts. Ibuprofen helps. Some nausea from pain. BMs normal. Feels pressure when voiding, no other UTI sx. S/P TAH w/ bilateral salpingectomy 2017 d/t endometriosis, still has ovaries. Has h/o ovarian cysts, feels the same.  Last pelvic u/s  02/22/19 in our office: normal Lt ovary, Rt ovary w/ simple cyst 2.6x2.9x2.8cm. 01/11/19 at [facility]: Lt ovary volume 28.19ml, 4.4x3.1x4.0cm, hypoechoic w/ internal blood flow on color doppler imaging, no discrete mass, uncertain whether large normal ovary or replacement of Lt ovary w/ large hypoechoic mass.   Patient's last menstrual period was 09/26/2015 (exact date). The current method of family planning is status post hysterectomy.  Last pap 2017. Results were:  normal Review of Systems:   Pertinent items are noted in HPI Denies fever/chills, dizziness, headaches, visual disturbances, fatigue, shortness of breath, chest pain, abdominal pain, vomiting, abnormal vaginal discharge/itching/odor/irritation, problems with periods, bowel movements, urination, or intercourse unless otherwise stated above.  Pertinent History Reviewed:  Reviewed past medical,surgical, social, obstetrical and family history.  Reviewed problem list, medications and allergies. Physical Assessment:   Vitals:   09/21/19 1138  BP: 140/89  Pulse: 77  Weight: (!) 310 lb 9.6 oz (140.9 kg)  Height: 5\' 7"  (1.702 m)  Body mass index is 48.65 kg/m.       Physical Examination:   General appearance: alert, well appearing, and in no distress  Mental status: alert, oriented to person, place, and time  Skin: warm & dry   Cardiovascular: normal heart rate  noted  Respiratory: normal respiratory effort, no distress  Abdomen: soft, mild tenderness low mid abd, mod LLQ tenderness, no knot felt  Pelvic: bimanual: mild suprapubic pain, moderate LLQ/adnexal pain, exam limited d/t habitus, but no masses felt  Extremities: no edema   Chaperone: Levy Pupa    No results found for this or any previous visit (from the past 24 hour(s)).  Assessment & Plan:  1) LLQ pain> w/ h/o ovarian cysts and questionable Lt ovarian cyst 01/11/19 u/s, w/ normal repeat u/s a month later. Will get asap pelvic u/s and f/u w/ MD after. Ibuprofen prn, rx phenergan for nausea. Warm baths, heating pads. If pain becomes severe call us or go to ED  Meds:  Meds ordered this encounter  Medications  . promethazine (PHENERGAN) 12.5 MG tablet    Sig: Take 1-2 tablets (12.5-25 mg total) by mouth every 6 (six) hours as needed for nausea or vomiting.    Dispense:  30 tablet    Refill:  0    Order Specific Question:   Supervising Provider    Answer:   Tania Ade H [[id number]]    Orders Placed This Encounter  Procedures  . US PELVIS (TRANSABDOMINAL ONLY)  . US PELVIS TRANSVAGINAL NON-OB (TV ONLY)    Return for ASAP, US:GYN and f/u w/ MD only after.  [place], [facility] 09/21/2019 12:15 PM

## 2019-09-26 ENCOUNTER — Ambulatory Visit (INDEPENDENT_AMBULATORY_CARE_PROVIDER_SITE_OTHER): Payer: 59

## 2019-09-26 ENCOUNTER — Encounter: Payer: Self-pay | Admitting: Obstetrics & Gynecology

## 2019-09-26 ENCOUNTER — Ambulatory Visit (INDEPENDENT_AMBULATORY_CARE_PROVIDER_SITE_OTHER): Payer: 59 | Admitting: Obstetrics & Gynecology

## 2019-09-26 ENCOUNTER — Other Ambulatory Visit: Payer: Self-pay

## 2019-09-26 VITALS — BP 144/97 | HR 88 | Ht 67.0 in | Wt 310.0 lb

## 2019-09-26 DIAGNOSIS — N8312 Corpus luteum cyst of left ovary: Secondary | ICD-10-CM

## 2019-09-26 DIAGNOSIS — R1032 Left lower quadrant pain: Secondary | ICD-10-CM

## 2019-09-26 DIAGNOSIS — Z90710 Acquired absence of both cervix and uterus: Secondary | ICD-10-CM

## 2019-09-26 DIAGNOSIS — N83291 Other ovarian cyst, right side: Secondary | ICD-10-CM | POA: Diagnosis not present

## 2019-09-26 DIAGNOSIS — Z8742 Personal history of other diseases of the female genital tract: Secondary | ICD-10-CM

## 2019-09-26 DIAGNOSIS — N831 Corpus luteum cyst of ovary, unspecified side: Secondary | ICD-10-CM

## 2019-09-26 MED ORDER — PROGESTERONE 200 MG PO CAPS: 200.0000 mg | ORAL_CAPSULE | Freq: Every day | ORAL | 2 refills | Status: DC

## 2019-09-26 NOTE — Progress Notes (Addendum)
PELVIC US TA/TV: normal vaginal cuff,normal right ovary,hemorrhagic left ovarian cyst 3.8 x 3.7 x 3.7 cm,left adnexal pain during ultrasound,unable to slide ovaries,no free fluid  Chaperone Angie

## 2019-09-26 NOTE — Progress Notes (Signed)
Follow up appointment for results  Chief Complaint  Patient presents with  . Follow-up    Discuss [place] Results    Blood pressure (!) 144/97, pulse 88, height 5\' 7"  (1.702 m), weight (!) 310 lb (140.6 kg), last menstrual period 09/26/2015.  US PELVIS TRANSVAGINAL NON-OB (TV ONLY)  Result Date: 09/26/2019 GYNECOLOGIC SONOGRAM Julie [name] is a 44 y.o. G2P0111 Patient's last menstrual period was 09/26/2015 (exact date). She is here for a pelvic sonogram for LLQ pain. Uterus                      Surgically removed,normal vaginal cuff Endometrium         N/a Right ovary             2.4 x 2.1 x 1.7 cm, wnl Left ovary                5 x 4.3 x 4.6 cm, hemorrhagic left ovarian cyst 3.8 x 3.7 x 3.7 cm Technician Comments: PELVIC US TA/TV: normal vaginal cuff,normal right ovary,hemorrhagic left ovarian cyst 3.8 x 3.7 x 3.7 cm,left adnexal pain during ultrasound,unable to slide ovaries,no free fluid [employer] 09/26/2019 9:09 AM Clinical Impression and recommendations: I have reviewed the sonogram results above, combined with the patient's current clinical course, below are my impressions and any appropriate recommendations for management based on the sonographic findings. Uterus endometrium surgically absent Right ovary normal Left ovary with functional cyst, hemorrhagic corpus luteum Patient has history of episodic ovarian cysts, last on was last August which resolved Will discuss with patient progesterone suppression and repeat scan 6 weeks or so This recommendation follows the consensus statement: Management of Asymptomatic Ovarian and Other Adnexal Cysts Imaged at [place]: Society of Radiologists in [place]. Radiology 2010; [phone] Florian Buff 09/26/2019 9:12 AM  US PELVIS (TRANSABDOMINAL ONLY)  Result Date: 09/26/2019 GYNECOLOGIC SONOGRAM Julie [name] is a 45 y.o. G2P0111 Patient's last menstrual period was 09/26/2015 (exact date). She is here for a pelvic sonogram for  LLQ pain. Uterus                      Surgically removed,normal vaginal cuff Endometrium         N/a Right ovary             2.4 x 2.1 x 1.7 cm, wnl Left ovary                5 x 4.3 x 4.6 cm, hemorrhagic left ovarian cyst 3.8 x 3.7 x 3.7 cm Technician Comments: PELVIC US TA/TV: normal vaginal cuff,normal right ovary,hemorrhagic left ovarian cyst 3.8 x 3.7 x 3.7 cm,left adnexal pain during ultrasound,unable to slide ovaries,no free fluid [employer] 09/26/2019 9:09 AM Clinical Impression and recommendations: I have reviewed the sonogram results above, combined with the patient's current clinical course, below are my impressions and any appropriate recommendations for management based on the sonographic findings. Uterus endometrium surgically absent Right ovary normal Left ovary with functional cyst, hemorrhagic corpus luteum Patient has history of episodic ovarian cysts, last on was last August which resolved Will discuss with patient progesterone suppression and repeat scan 6 weeks or so This recommendation follows the consensus statement: Management of Asymptomatic Ovarian and Other Adnexal Cysts Imaged at [place]: Society of Radiologists in [place]. Radiology 2010; [phone] Florian Buff 09/26/2019 9:12 AM     MEDS ordered this encounter: Meds ordered this encounter  Medications  .  progesterone (PROMETRIUM) 200 MG capsule    Sig: Take 1 capsule (200 mg total) by mouth daily.    Dispense:  30 capsule    Refill:  2    Orders for this encounter: No orders of the defined types were placed in this encounter.   Impression: Left corpus luteum cyst, recurrent  Plan: Progestational suppression for 2 months  Follow Up: No follow-ups on file.       Face to face time:  10 minutes  Greater than 50% of the visit time was spent in counseling and coordination of care with the patient.  The summary and outline of the counseling and care coordination is summarized in  the note above.   All questions were answered.  Past Medical History:  Diagnosis Date  . Anemia   . Anemia   . Anxiety   . Breast discharge 07/01/2013   Pt noticed discharge, none on exam today will check TSH and Prolactin  . Breast mass, right 10/20/2012  . Depression   . Dysmenorrhea 09/19/2015  . Dyspareunia in female 09/19/2015  . Ectopic pregnancy   . Eczema   . GERD (gastroesophageal reflux disease)    no meds  . Headache   . Hernia of abdominal wall 01/04/2013  . Kidney stone   . Left breast mass 04/12/2014   Has pea sized nodule at 6 o'clock tender and mobile, will get mammogram and [place] if needed  . Menorrhagia with regular cycle 09/19/2015  . Pelvic pain in female 09/19/2015  . Placenta previa 2012   Current pregnancy   . Urinary tract infection   . Vaginal discharge 07/01/2013    Past Surgical History:  Procedure Laterality Date  . ABDOMINAL HYSTERECTOMY N/A 10/10/2015   Procedure: HYSTERECTOMY ABDOMINAL;  Surgeon: Florian Buff, MD;  Location: [facility];  Service: Gynecology;  Laterality: N/A;  . APPENDECTOMY    . APPENDECTOMY    . BILATERAL SALPINGECTOMY Bilateral 10/10/2015   Procedure: BILATERAL SALPINGECTOMY;  Surgeon: Florian Buff, MD;  Location: [facility];  Service: Gynecology;  Laterality: Bilateral;  . BREAST BIOPSY Right   . CESAREAN SECTION  04/24/2011   Procedure: CESAREAN SECTION;  Surgeon: Florian Buff, MD;  Location: [facility];  Service: Gynecology;  Laterality: N/A;  Primary/Previa  . CHOLECYSTECTOMY    . COLONOSCOPY    . CYSTOSCOPY W/ URETERAL STENT PLACEMENT Left 08/04/2014   Procedure: CYSTOSCOPY WITH LEFT  RETROGRADE PYELOGRAM/ LEFT URETERAL STENT PLACEMENT;  Surgeon: Festus Aloe, MD;  Location: [facility];  Service: Urology;  Laterality: Left;  . TONSILLECTOMY    . UPPER GI ENDOSCOPY      OB History    Gravida  2   Para  1   Term  0   Preterm  1   AB  1   Living  1     SAB  0   TAB  0   Ectopic  1   Multiple  0   Live Births  1            Allergies  Allergen Reactions  . Clarithromycin Other (See Comments)    Makes throat raw  . Topamax [Topiramate] Itching    Social History   Socioeconomic History  . Marital status: Married    Spouse name: Not on file  . Number of children: 1  . Years of education: Not on file  . Highest education level: Not on file  Occupational History  . Not on file  Tobacco Use  .  Smoking status: Former Smoker    Years: 14.00    Types: Cigarettes    Quit date: 09/07/2009    Years since quitting: 10.0  . Smokeless tobacco: Never Used  Substance and Sexual Activity  . Alcohol use: Yes    Comment: occ mixed drink  . Drug use: No  . Sexual activity: Yes    Birth control/protection: Surgical    Comment: hyst  Other Topics Concern  . Not on file  Social History Narrative  . Not on file   Social Determinants of Health   Financial Resource Strain:   . Difficulty of Paying Living Expenses:   Food Insecurity:   . Worried About Charity fundraiser in the Last Year:   . Arboriculturist in the Last Year:   Transportation Needs:   . Film/video editor (Medical):   Marland Kitchen Lack of Transportation (Non-Medical):   Physical Activity:   . Days of Exercise per Week:   . Minutes of Exercise per Session:   Stress:   . Feeling of Stress :   Social Connections:   . Frequency of Communication with Friends and Family:   . Frequency of Social Gatherings with Friends and Family:   . Attends Religious Services:   . Active Member of Clubs or Organizations:   . Attends Archivist Meetings:   Marland Kitchen Marital Status:     Family History  Problem Relation Age of Onset  . Depression Mother   . Cancer Mother   . Seizures Mother   . Bipolar disorder Mother   . Asthma Mother   . Atrial fibrillation Mother   . Hypertension Father   . Asthma Father   . Heart disease Maternal Grandmother   . Cancer Maternal Grandmother        oral; lymph nodes  . Hypertension Maternal Grandmother   .  Other Maternal Grandmother        memory problems  . Stroke Paternal Grandfather   . Hypertension Paternal Grandfather   . Other Paternal Grandfather        brain tumors  . Heart disease Maternal Grandfather   . Pancreatitis Maternal Grandfather   . Cancer Paternal Grandmother        breast  . Heart disease Paternal Grandmother   . Kidney disease Paternal Grandmother        kidney failure  . Diabetes Paternal Grandmother   . Urticaria Son   . Allergic rhinitis Neg Hx   . Angioedema Neg Hx   . Eczema Neg Hx   . Immunodeficiency Neg Hx

## 2019-10-05 ENCOUNTER — Ambulatory Visit: Payer: 59 | Attending: Internal Medicine

## 2019-10-05 ENCOUNTER — Other Ambulatory Visit: Payer: Self-pay

## 2019-10-05 DIAGNOSIS — Z20822 Contact with and (suspected) exposure to covid-19: Secondary | ICD-10-CM

## 2019-10-06 LAB — NOVEL CORONAVIRUS, NAA: SARS-CoV-2, NAA: NOT DETECTED

## 2019-10-06 LAB — SARS-COV-2, NAA 2 DAY TAT

## 2019-10-10 ENCOUNTER — Other Ambulatory Visit: Payer: 59

## 2019-10-10 ENCOUNTER — Other Ambulatory Visit: Payer: Self-pay

## 2019-10-10 ENCOUNTER — Ambulatory Visit: Payer: 59 | Attending: Internal Medicine

## 2019-10-10 DIAGNOSIS — Z20822 Contact with and (suspected) exposure to covid-19: Secondary | ICD-10-CM

## 2019-10-11 LAB — NOVEL CORONAVIRUS, NAA: SARS-CoV-2, NAA: NOT DETECTED

## 2019-10-11 LAB — SARS-COV-2, NAA 2 DAY TAT

## 2019-11-24 ENCOUNTER — Other Ambulatory Visit: Payer: Self-pay | Admitting: Obstetrics & Gynecology

## 2019-11-24 DIAGNOSIS — N83202 Unspecified ovarian cyst, left side: Secondary | ICD-10-CM

## 2019-11-25 ENCOUNTER — Other Ambulatory Visit: Payer: Self-pay | Admitting: Obstetrics & Gynecology

## 2019-11-28 ENCOUNTER — Ambulatory Visit (INDEPENDENT_AMBULATORY_CARE_PROVIDER_SITE_OTHER): Payer: 59 | Admitting: Obstetrics & Gynecology

## 2019-11-28 ENCOUNTER — Encounter: Payer: Self-pay | Admitting: Obstetrics & Gynecology

## 2019-11-28 ENCOUNTER — Ambulatory Visit (INDEPENDENT_AMBULATORY_CARE_PROVIDER_SITE_OTHER): Payer: 59

## 2019-11-28 VITALS — BP 135/88 | HR 83 | Ht 67.0 in | Wt 308.0 lb

## 2019-11-28 DIAGNOSIS — N831 Corpus luteum cyst of ovary, unspecified side: Secondary | ICD-10-CM | POA: Diagnosis not present

## 2019-11-28 DIAGNOSIS — N83202 Unspecified ovarian cyst, left side: Secondary | ICD-10-CM

## 2019-11-28 DIAGNOSIS — R1032 Left lower quadrant pain: Secondary | ICD-10-CM

## 2019-11-28 MED ORDER — NORETHINDRONE ACET-ETHINYL EST 1-20 MG-MCG(24) PO CHEW: CHEWABLE_TABLET | ORAL | 12 refills | Status: DC

## 2019-11-28 MED ORDER — NORETHIN ACE-ETH ESTRAD-FE 1-20 MG-MCG(24) PO TABS: ORAL_TABLET | ORAL | 13 refills | Status: DC

## 2019-11-28 NOTE — Progress Notes (Addendum)
PELVIC US TA/TV: normal vaginal cuff,normal ovaries,unable to slide ovaries,no free fluid,left adnexal discomfort during ultrasound  Chaperone Peggy

## 2019-11-28 NOTE — Addendum Note (Signed)
Addended by: Florian Buff on: 11/28/2019 02:00 PM   Modules accepted: Orders

## 2019-11-28 NOTE — Progress Notes (Addendum)
Follow up appointment for results  Chief Complaint  Patient presents with  . discuss [place] results    Blood pressure 135/88, pulse 83, height 5\' 7"  (1.702 m), weight (!) 308 lb (139.7 kg), last menstrual period 09/26/2015.  US Transvaginal Non-OB  Result Date: 11/28/2019 GYNECOLOGIC SONOGRAM Julie [name] is a 44 y.o. G2P0111 Patient's last menstrual period was 09/26/2015 (exact date).She is here for a pelvic sonogram for f/u hemorrhagic left ovarian cyst. Uterus                      Surgically absent,normal vaginal cuff Endometrium          n/a Right ovary             2.7 x 1.6 x 1.7 cm, wnl Left ovary                2.6 x 2.7 x 2.7 cm, wnl No free fluid Technician Comments: PELVIC US TA/TV: normal vaginal cuff,normal ovaries,unable to slide ovaries,no free fluid,left adnexal discomfort during ultrasound Chaperone [id number] Heide Guile 11/28/2019 8:58 AM Clinical Impression and recommendations: I have reviewed the sonogram results above, combined with the patient's current clinical course, below are my impressions and any appropriate recommendations for management based on the sonographic findings. Resolution of the left corpus luteum cyst Both ovaries normal Normal pelvic sonogram Florian Buff 11/28/2019 9:16 AM  US Pelvis Complete  Result Date: 11/28/2019 GYNECOLOGIC SONOGRAM Julie [name] is a 44 y.o. G2P0111 Patient's last menstrual period was 09/26/2015 (exact date).She is here for a pelvic sonogram for f/u hemorrhagic left ovarian cyst. Uterus                      Surgically absent,normal vaginal cuff Endometrium          n/a Right ovary             2.7 x 1.6 x 1.7 cm, wnl Left ovary                2.6 x 2.7 x 2.7 cm, wnl No free fluid Technician Comments: PELVIC US TA/TV: normal vaginal cuff,normal ovaries,unable to slide ovaries,no free fluid,left adnexal discomfort during ultrasound Chaperone [address] Heide Guile 11/28/2019 8:58 AM Clinical Impression and recommendations: I have reviewed the  sonogram results above, combined with the patient's current clinical course, below are my impressions and any appropriate recommendations for management based on the sonographic findings. Resolution of the left corpus luteum cyst Both ovaries normal Normal pelvic sonogram Florian Buff 11/28/2019 9:16 AM     MEDS ordered this encounter: Meds ordered this encounter  Medications  . Norethindrone Acetate-Ethinyl Estrad-FE (LOESTRIN 24 FE) 1-20 MG-MCG(24) tablet    Sig: Take 1 daily, no off week, continuous OCP therapy    Dispense:  28 tablet    Refill:  13    Orders for this encounter: No orders of the defined types were placed in this encounter.   Impression:   ICD-10-CM   1. Corpus luteum cyst hemorrhage, left, recurrent  N83.10    US reveals cyst resoltuion w/ partial response to the prometrium, discussed options, will try low dose 20 mic OCP for supression and improved symtpom relief  2. LLQ pain  R10.32      Plan: See above   Follow Up: Return if symptoms worsen or fail to improve, for "ball is in [place]'s court".       Face to face time:  15 minutes  Greater than 50% of the visit time was spent in counseling and coordination of care with the patient.  The summary and outline of the counseling and care coordination is summarized in the note above.   All questions were answered.  Past Medical History:  Diagnosis Date  . Anemia   . Anemia   . Anxiety   . Breast discharge 07/01/2013   Pt noticed discharge, none on exam today will check TSH and Prolactin  . Breast mass, right 10/20/2012  . Depression   . Dysmenorrhea 09/19/2015  . Dyspareunia in female 09/19/2015  . Ectopic pregnancy   . Eczema   . GERD (gastroesophageal reflux disease)    no meds  . Headache   . Hernia of abdominal wall 01/04/2013  . Kidney stone   . Left breast mass 04/12/2014   Has pea sized nodule at 6 o'clock tender and mobile, will get mammogram and [place] if needed  . Menorrhagia with regular  cycle 09/19/2015  . Pelvic pain in female 09/19/2015  . Placenta previa 2012   Current pregnancy   . Urinary tract infection   . Vaginal discharge 07/01/2013    Past Surgical History:  Procedure Laterality Date  . ABDOMINAL HYSTERECTOMY N/A 10/10/2015   Procedure: HYSTERECTOMY ABDOMINAL;  Surgeon: Florian Buff, MD;  Location: [facility];  Service: Gynecology;  Laterality: N/A;  . APPENDECTOMY    . APPENDECTOMY    . BILATERAL SALPINGECTOMY Bilateral 10/10/2015   Procedure: BILATERAL SALPINGECTOMY;  Surgeon: Florian Buff, MD;  Location: [facility];  Service: Gynecology;  Laterality: Bilateral;  . BREAST BIOPSY Right   . CESAREAN SECTION  04/24/2011   Procedure: CESAREAN SECTION;  Surgeon: Florian Buff, MD;  Location: [facility];  Service: Gynecology;  Laterality: N/A;  Primary/Previa  . CHOLECYSTECTOMY    . COLONOSCOPY    . CYSTOSCOPY W/ URETERAL STENT PLACEMENT Left 08/04/2014   Procedure: CYSTOSCOPY WITH LEFT  RETROGRADE PYELOGRAM/ LEFT URETERAL STENT PLACEMENT;  Surgeon: Festus Aloe, MD;  Location: [facility];  Service: Urology;  Laterality: Left;  . TONSILLECTOMY    . UPPER GI ENDOSCOPY      OB History    Gravida  2   Para  1   Term  0   Preterm  1   AB  1   Living  1     SAB  0   TAB  0   Ectopic  1   Multiple  0   Live Births  1           Allergies  Allergen Reactions  . Clarithromycin Other (See Comments)    Makes throat raw  . Topamax [Topiramate] Itching    Social History   Socioeconomic History  . Marital status: Married    Spouse name: Not on file  . Number of children: 1  . Years of education: Not on file  . Highest education level: Not on file  Occupational History  . Not on file  Tobacco Use  . Smoking status: Former Smoker    Years: 14.00    Types: Cigarettes    Quit date: 09/07/2009    Years since quitting: 10.2  . Smokeless tobacco: Never Used  Vaping Use  . Vaping Use: Never used  Substance and Sexual Activity  . Alcohol use: Yes     Comment: occ mixed drink  . Drug use: No  . Sexual activity: Yes    Birth control/protection: Surgical    Comment: hyst  Other Topics  Concern  . Not on file  Social History Narrative  . Not on file   Social Determinants of Health   Financial Resource Strain:   . Difficulty of Paying Living Expenses:   Food Insecurity:   . Worried About Charity fundraiser in the Last Year:   . Arboriculturist in the Last Year:   Transportation Needs:   . Film/video editor (Medical):   Marland Kitchen Lack of Transportation (Non-Medical):   Physical Activity:   . Days of Exercise per Week:   . Minutes of Exercise per Session:   Stress:   . Feeling of Stress :   Social Connections:   . Frequency of Communication with Friends and Family:   . Frequency of Social Gatherings with Friends and Family:   . Attends Religious Services:   . Active Member of Clubs or Organizations:   . Attends Archivist Meetings:   Marland Kitchen Marital Status:     Family History  Problem Relation Age of Onset  . Depression Mother   . Cancer Mother   . Seizures Mother   . Bipolar disorder Mother   . Asthma Mother   . Atrial fibrillation Mother   . Hypertension Father   . Asthma Father   . Heart disease Maternal Grandmother   . Cancer Maternal Grandmother        oral; lymph nodes  . Hypertension Maternal Grandmother   . Other Maternal Grandmother        memory problems  . Stroke Paternal Grandfather   . Hypertension Paternal Grandfather   . Other Paternal Grandfather        brain tumors  . Heart disease Maternal Grandfather   . Pancreatitis Maternal Grandfather   . Cancer Paternal Grandmother        breast  . Heart disease Paternal Grandmother   . Kidney disease Paternal Grandmother        kidney failure  . Diabetes Paternal Grandmother   . Urticaria Son   . Allergic rhinitis Neg Hx   . Angioedema Neg Hx   . Eczema Neg Hx   . Immunodeficiency Neg Hx

## 2020-01-22 ENCOUNTER — Telehealth: Payer: Self-pay | Admitting: Emergency Medicine

## 2020-01-22 ENCOUNTER — Encounter: Payer: Self-pay | Admitting: Emergency Medicine

## 2020-01-22 ENCOUNTER — Other Ambulatory Visit: Payer: Self-pay

## 2020-01-22 ENCOUNTER — Ambulatory Visit
Admission: EM | Admit: 2020-01-22 | Discharge: 2020-01-22 | Disposition: A | Discharge status: Home / Self Care | Payer: 59 | Attending: Emergency Medicine | Admitting: Emergency Medicine

## 2020-01-22 DIAGNOSIS — J069 Acute upper respiratory infection, unspecified: Secondary | ICD-10-CM | POA: Diagnosis present

## 2020-01-22 DIAGNOSIS — J029 Acute pharyngitis, unspecified: Secondary | ICD-10-CM | POA: Insufficient documentation

## 2020-01-22 DIAGNOSIS — Z1152 Encounter for screening for COVID-19: Secondary | ICD-10-CM | POA: Diagnosis present

## 2020-01-22 LAB — POCT RAPID STREP A (OFFICE): Rapid Strep A Screen: NEGATIVE

## 2020-01-22 MED ORDER — PREDNISONE 10 MG PO TABS
20.0000 mg | ORAL_TABLET | Freq: Every day | ORAL | 0 refills | Status: AC
Start: 2020-01-22 — End: 2020-01-29

## 2020-01-22 MED ORDER — FLUTICASONE PROPIONATE 50 MCG/ACT NA SUSP
1.0000 | Freq: Every day | NASAL | 0 refills | Status: DC
Start: 2020-01-22 — End: 2020-01-22

## 2020-01-22 MED ORDER — CETIRIZINE HCL 10 MG PO TABS: 10.0000 mg | ORAL_TABLET | Freq: Every day | ORAL | 0 refills | Status: DC

## 2020-01-22 MED ORDER — FLUTICASONE PROPIONATE 50 MCG/ACT NA SUSP
1.0000 | Freq: Every day | NASAL | 0 refills | Status: DC
Start: 2020-01-22 — End: 2020-10-30

## 2020-01-22 MED ORDER — PREDNISONE 10 MG PO TABS
20.0000 mg | ORAL_TABLET | Freq: Every day | ORAL | 0 refills | Status: DC
Start: 2020-01-22 — End: 2020-01-22

## 2020-01-22 MED ORDER — BENZONATATE 100 MG PO CAPS: 100.0000 mg | ORAL_CAPSULE | Freq: Three times a day (TID) | ORAL | 0 refills | Status: DC

## 2020-01-22 MED ORDER — BENZONATATE 100 MG PO CAPS
100.0000 mg | ORAL_CAPSULE | Freq: Three times a day (TID) | ORAL | 0 refills | Status: DC
Start: 2020-01-22 — End: 2020-01-22

## 2020-01-22 NOTE — Discharge Instructions (Signed)
POCT strep test is negative COVID testing ordered.  It will take between 2-7 days for test results.  Someone will contact you regarding abnormal results.    In the meantime: You should remain isolated in your home for 10 days from symptom onset AND greater than 24  hours after symptoms resolution (absence of fever without the use of fever-reducing medication and improvement in respiratory symptoms), whichever is longer Get plenty of rest and push fluids Tessalon Perles prescribed for cough Zyrtec for nasal congestion, runny nose, and/or sore throat Flonase for nasal congestion and runny nose Low-dose prednisone was prescribed Use medications daily for symptom relief Use OTC medications like ibuprofen or tylenol as needed fever or pain Call or go to the ED if you have any new or worsening symptoms such as fever, worsening cough, shortness of breath, chest tightness, chest pain, turning blue, changes in mental status, etc..Marland Kitchen

## 2020-01-22 NOTE — ED Provider Notes (Signed)
[facility]   [id number] 01/22/20 Arrival Time: 1013   CC: COVID symptoms  SUBJECTIVE: History from: patient.  Julie [name] is a 44 y.o. female who presents to the urgent care for complaint of fatigue, ear fullness and sore throat for the past 3 days.  Denies sick exposure to COVID, flu or strep.  Denies recent travel.  Has tried OTC medication without relief.  Denies aggravating factors.  Denies previous symptoms in the past.   Denies fever, chills, fatigue, sinus pain, rhinorrhea,  SOB, wheezing, chest pain, nausea, changes in bowel or bladder habits.    ROS: As per HPI.  All other pertinent ROS negative.      Past Medical History:  Diagnosis Date  . Anemia   . Anemia   . Anxiety   . Breast discharge 07/01/2013   Pt noticed discharge, none on exam today will check TSH and Prolactin  . Breast mass, right 10/20/2012  . Depression   . Dysmenorrhea 09/19/2015  . Dyspareunia in female 09/19/2015  . Ectopic pregnancy   . Eczema   . GERD (gastroesophageal reflux disease)    no meds  . Headache   . Hernia of abdominal wall 01/04/2013  . Kidney stone   . Left breast mass 04/12/2014   Has pea sized nodule at 6 o'clock tender and mobile, will get mammogram and [place] if needed  . Menorrhagia with regular cycle 09/19/2015  . Pelvic pain in female 09/19/2015  . Placenta previa 2012   Current pregnancy   . Urinary tract infection   . Vaginal discharge 07/01/2013   Past Surgical History:  Procedure Laterality Date  . ABDOMINAL HYSTERECTOMY N/A 10/10/2015   Procedure: HYSTERECTOMY ABDOMINAL;  Surgeon: Florian Buff, MD;  Location: [facility];  Service: Gynecology;  Laterality: N/A;  . APPENDECTOMY    . APPENDECTOMY    . BILATERAL SALPINGECTOMY Bilateral 10/10/2015   Procedure: BILATERAL SALPINGECTOMY;  Surgeon: Florian Buff, MD;  Location: [facility];  Service: Gynecology;  Laterality: Bilateral;  . BREAST BIOPSY Right   . CESAREAN SECTION  04/24/2011   Procedure: CESAREAN SECTION;   Surgeon: Florian Buff, MD;  Location: [facility];  Service: Gynecology;  Laterality: N/A;  Primary/Previa  . CHOLECYSTECTOMY    . COLONOSCOPY    . CYSTOSCOPY W/ URETERAL STENT PLACEMENT Left 08/04/2014   Procedure: CYSTOSCOPY WITH LEFT  RETROGRADE PYELOGRAM/ LEFT URETERAL STENT PLACEMENT;  Surgeon: Festus Aloe, MD;  Location: [facility];  Service: Urology;  Laterality: Left;  . TONSILLECTOMY    . UPPER GI ENDOSCOPY     Allergies  Allergen Reactions  . Clarithromycin Other (See Comments)    Makes throat raw  . Topamax [Topiramate] Itching   No current facility-administered medications on file prior to encounter.   Current Outpatient Medications on File Prior to Encounter  Medication Sig Dispense Refill  . ALPRAZolam (XANAX) 1 MG tablet Take 0.5-1 mg by mouth 3 (three) times daily as needed for anxiety.    . Ascorbic Acid (VITAMIN C) 1000 MG tablet Take 1,000 mg by mouth daily.    Marland Kitchen buPROPion (WELLBUTRIN XL) 150 MG 24 hr tablet Take 150 mg by mouth every morning.    Eduard Roux (AIMOVIG) 70 MG/ML SOAJ Inject 70 mg into the skin every 30 (thirty) days. (Patient taking differently: Inject 140 mg into the skin every 30 (thirty) days. ) 3 pen 0  . furosemide (LASIX) 40 MG tablet Take 40 mg by mouth as needed.     Marland Kitchen levocetirizine (  XYZAL) 5 MG tablet TAKE ONE TABLET BY MOUTH EVERY EVENING. (Patient taking differently: Take 5 mg by mouth every evening. ) 30 tablet 1  . Norethin Ace-Eth Estrad-FE (NORETHINDRONE ACET-ETHINYL EST) 1-20 MG-MCG(24) CHEW 1 daily, no off days 28 tablet 12  . omeprazole (PRILOSEC) 20 MG capsule Take 20 mg by mouth daily.   4  . oxybutynin (DITROPAN-XL) 5 MG 24 hr tablet Take 5 mg by mouth at bedtime.    . Potassium 99 MG TABS Take by mouth.    . progesterone (PROMETRIUM) 200 MG capsule TAKE ONE CAPSULE BY MOUTH ONCE DAILY. 30 capsule 11  . sertraline (ZOLOFT) 100 MG tablet Take 140 mg by mouth every morning.     Marland Kitchen tiZANidine (ZANAFLEX) 4 MG tablet TAKE 1/2-1 TABLET BY  MOUTH AT BEDTIME. 30 tablet 2   Social History   Socioeconomic History  . Marital status: Married    Spouse name: Not on file  . Number of children: 1  . Years of education: Not on file  . Highest education level: Not on file  Occupational History  . Not on file  Tobacco Use  . Smoking status: Former Smoker    Years: 14.00    Types: Cigarettes    Quit date: 09/07/2009    Years since quitting: 10.3  . Smokeless tobacco: Never Used  Vaping Use  . Vaping Use: Never used  Substance and Sexual Activity  . Alcohol use: Yes    Comment: occ mixed drink  . Drug use: No  . Sexual activity: Yes    Birth control/protection: Surgical    Comment: hyst  Other Topics Concern  . Not on file  Social History Narrative  . Not on file   Social Determinants of Health   Financial Resource Strain:   . Difficulty of Paying Living Expenses:   Food Insecurity:   . Worried About Charity fundraiser in the Last Year:   . Arboriculturist in the Last Year:   Transportation Needs:   . Film/video editor (Medical):   Marland Kitchen Lack of Transportation (Non-Medical):   Physical Activity:   . Days of Exercise per Week:   . Minutes of Exercise per Session:   Stress:   . Feeling of Stress :   Social Connections:   . Frequency of Communication with Friends and Family:   . Frequency of Social Gatherings with Friends and Family:   . Attends Religious Services:   . Active Member of Clubs or Organizations:   . Attends Archivist Meetings:   Marland Kitchen Marital Status:   Intimate Partner Violence:   . Fear of Current or Ex-Partner:   . Emotionally Abused:   Marland Kitchen Physically Abused:   . Sexually Abused:    Family History  Problem Relation Age of Onset  . Depression Mother   . Cancer Mother   . Seizures Mother   . Bipolar disorder Mother   . Asthma Mother   . Atrial fibrillation Mother   . Hypertension Father   . Asthma Father   . Heart disease Maternal Grandmother   . Cancer Maternal Grandmother         oral; lymph nodes  . Hypertension Maternal Grandmother   . Other Maternal Grandmother        memory problems  . Stroke Paternal Grandfather   . Hypertension Paternal Grandfather   . Other Paternal Grandfather        brain tumors  . Heart disease Maternal Grandfather   .  Pancreatitis Maternal Grandfather   . Cancer Paternal Grandmother        breast  . Heart disease Paternal Grandmother   . Kidney disease Paternal Grandmother        kidney failure  . Diabetes Paternal Grandmother   . Urticaria Son   . Allergic rhinitis Neg Hx   . Angioedema Neg Hx   . Eczema Neg Hx   . Immunodeficiency Neg Hx     OBJECTIVE:  Vitals:   01/22/20 1025 01/22/20 1029  BP: 116/75   Pulse: 85   Resp: 17   Temp: 98.2 F (36.8 C)   TempSrc: Oral   SpO2: 97%   Weight:  (!) 306 lb 7 oz (139 kg)  Height:  5\' 7"  (1.702 m)     General appearance: alert; appears fatigued, but nontoxic; speaking in full sentences and tolerating own secretions HEENT: NCAT; Ears: EACs clear, TMs pearly gray; Eyes: PERRL.  EOM grossly intact. Sinuses: nontender; Nose: nares patent without rhinorrhea, Throat: oropharynx clear, tonsils not present, uvula midline  Neck: supple without LAD Lungs: unlabored respirations, symmetrical air entry; cough: moderate; no respiratory distress; CTAB Heart: regular rate and rhythm.  Radial pulses 2+ symmetrical bilaterally Skin: warm and dry Psychological: alert and cooperative; normal mood and affect  LABS:  No results found for this or any previous visit (from the past 24 hour(s)).   ASSESSMENT & PLAN:  1. Sore throat   2. Viral URI with cough   3. Encounter for screening for COVID-19     Meds ordered this encounter  Medications  . benzonatate (TESSALON) 100 MG capsule    Sig: Take 1 capsule (100 mg total) by mouth every 8 (eight) hours.    Dispense:  30 capsule    Refill:  0  . fluticasone (FLONASE) 50 MCG/ACT nasal spray    Sig: Place 1 spray into both nostrils  daily for 14 days.    Dispense:  16 g    Refill:  0  . cetirizine (ZYRTEC ALLERGY) 10 MG tablet    Sig: Take 1 tablet (10 mg total) by mouth daily.    Dispense:  30 tablet    Refill:  0  . predniSONE (DELTASONE) 10 MG tablet    Sig: Take 2 tablets (20 mg total) by mouth daily.    Dispense:  15 tablet    Refill:  0    Discharge Instructions POCT strep test is negative COVID testing ordered.  It will take between 2-7 days for test results.  Someone will contact you regarding abnormal results.    In the meantime: You should remain isolated in your home for 10 days from symptom onset AND greater than 24  hours after symptoms resolution (absence of fever without the use of fever-reducing medication and improvement in respiratory symptoms), whichever is longer Get plenty of rest and push fluids Tessalon Perles prescribed for cough Zyrtec for nasal congestion, runny nose, and/or sore throat Flonase for nasal congestion and runny nose Low-dose prednisone was prescribed Use medications daily for symptom relief Use OTC medications like ibuprofen or tylenol as needed fever or pain Call or go to the ED if you have any new or worsening symptoms such as fever, worsening cough, shortness of breath, chest tightness, chest pain, turning blue, changes in mental status, etc...   Reviewed expectations re: course of current medical issues. Questions answered. Outlined signs and symptoms indicating need for more acute intervention. Patient verbalized understanding. After Visit Summary given.  Emerson Monte, [place] 01/22/20 1114

## 2020-01-22 NOTE — ED Triage Notes (Signed)
Fatigue, ear fullness and sore throat x 3 days

## 2020-01-23 LAB — SARS-COV-2, NAA 2 DAY TAT

## 2020-01-23 LAB — NOVEL CORONAVIRUS, NAA: SARS-CoV-2, NAA: NOT DETECTED

## 2020-01-25 LAB — CULTURE, GROUP A STREP (THRC)

## 2020-01-31 ENCOUNTER — Ambulatory Visit: Payer: 59 | Admitting: Cardiology

## 2020-03-08 ENCOUNTER — Other Ambulatory Visit: Payer: Self-pay

## 2020-03-08 ENCOUNTER — Encounter: Payer: Self-pay | Admitting: Cardiology

## 2020-03-08 ENCOUNTER — Ambulatory Visit (INDEPENDENT_AMBULATORY_CARE_PROVIDER_SITE_OTHER): Payer: 59 | Admitting: Cardiology

## 2020-03-08 VITALS — BP 125/80 | HR 80 | Temp 97.2°F | Ht 67.0 in | Wt 300.0 lb

## 2020-03-08 DIAGNOSIS — R072 Precordial pain: Secondary | ICD-10-CM

## 2020-03-08 DIAGNOSIS — E785 Hyperlipidemia, unspecified: Secondary | ICD-10-CM

## 2020-03-08 DIAGNOSIS — Z7189 Other specified counseling: Secondary | ICD-10-CM

## 2020-03-08 DIAGNOSIS — Z6841 Body Mass Index (BMI) 40.0 and over, adult: Secondary | ICD-10-CM

## 2020-03-08 NOTE — Progress Notes (Signed)
Cardiology Office Note:    Date:  03/08/2020   ID:  Julie [name], DOB 1975-07-28, MRN [id number]  PCP:  Sharilyn Sites, MD  Cardiologist:  Buford Dresser, MD  Referring MD: Sharilyn Sites, MD   CC: new patient consultation for chest pain  History of Present Illness:    Julie [name] is a 44 y.o. female with a hx of hyperlipidemia, obesity who is seen as a new consult at the request of Sharilyn Sites, MD for the evaluation and management of chest pain.  Records reviewed from appointment 01/06/20 with Dr. Hilma Favors. Noted 4 months of chest pain, squeezing, left sided. Nonexertional. No associated symptoms. Referred for further evaluation.  Today: Hands and feet go numb, intermittent LE edema as additional concerns today.  Chest pain: -Initial onset: first thought it was gas, tried Tums, no improvement. Has been ongoing for several months. -Quality: dull, churning in her chest -Frequency: happens at least once/day. lasts 30-45 minutes, more intense before it eases off. -Associated symptoms: gets dizzy frequently, not specifically related to pain -Aggravating/alleviating factors: none that she has found -Prior cardiac history: none -Prior workup: none -Prior treatment: none -Alcohol: rare, one every few months -Tobacco: former, quit 2010. -Comorbidities: BMI 47. Has hyperlipidemia. Denies hypertension, diabetes, kidney disease. No cardiovascular issues during her pregnancies. -Exercise level: stays active with her kids, takes care of her mom and dad.  -Cardiac ROS: no shortness of breath, no PND, no orthopnea, no syncope -Family history: mother is 61, has afib. Mat Gpa with MI in his 52s. Unknown heart issues on father's side but no heart attacks. Has a younger brother.  Has been on lasix PRN for ankle swelling. Has never been on medication for cholesterol.  Past Medical History:  Diagnosis Date  . Anemia   . Anemia   . Anxiety   . Breast discharge 07/01/2013   Pt noticed  discharge, none on exam today will check TSH and Prolactin  . Breast mass, right 10/20/2012  . Depression   . Dysmenorrhea 09/19/2015  . Dyspareunia in female 09/19/2015  . Ectopic pregnancy   . Eczema   . GERD (gastroesophageal reflux disease)    no meds  . Headache   . Hernia of abdominal wall 01/04/2013  . Kidney stone   . Left breast mass 04/12/2014   Has pea sized nodule at 6 o'clock tender and mobile, will get mammogram and [place] if needed  . Menorrhagia with regular cycle 09/19/2015  . Pelvic pain in female 09/19/2015  . Placenta previa 2012   Current pregnancy   . Urinary tract infection   . Vaginal discharge 07/01/2013    Past Surgical History:  Procedure Laterality Date  . ABDOMINAL HYSTERECTOMY N/A 10/10/2015   Procedure: HYSTERECTOMY ABDOMINAL;  Surgeon: Florian Buff, MD;  Location: [facility];  Service: Gynecology;  Laterality: N/A;  . APPENDECTOMY    . APPENDECTOMY    . BILATERAL SALPINGECTOMY Bilateral 10/10/2015   Procedure: BILATERAL SALPINGECTOMY;  Surgeon: Florian Buff, MD;  Location: [facility];  Service: Gynecology;  Laterality: Bilateral;  . BREAST BIOPSY Right   . CESAREAN SECTION  04/24/2011   Procedure: CESAREAN SECTION;  Surgeon: Florian Buff, MD;  Location: [facility];  Service: Gynecology;  Laterality: N/A;  Primary/Previa  . CHOLECYSTECTOMY    . COLONOSCOPY    . CYSTOSCOPY W/ URETERAL STENT PLACEMENT Left 08/04/2014   Procedure: CYSTOSCOPY WITH LEFT  RETROGRADE PYELOGRAM/ LEFT URETERAL STENT PLACEMENT;  Surgeon: Festus Aloe, MD;  Location: [facility];  Service:  Urology;  Laterality: Left;  . TONSILLECTOMY    . UPPER GI ENDOSCOPY      Current Medications: Current Outpatient Medications on File Prior to Visit  Medication Sig  . ALPRAZolam (XANAX) 1 MG tablet Take 0.5-1 mg by mouth 3 (three) times daily as needed for anxiety.  . Ascorbic Acid (VITAMIN C) 1000 MG tablet Take 1,000 mg by mouth daily.  Marland Kitchen buPROPion (WELLBUTRIN XL) 150 MG 24 hr tablet Take 150 mg by mouth  every morning.  . furosemide (LASIX) 40 MG tablet Take 40 mg by mouth as needed.   Marland Kitchen levocetirizine (XYZAL) 5 MG tablet TAKE ONE TABLET BY MOUTH EVERY EVENING. (Patient taking differently: Take 5 mg by mouth every evening. )  . omeprazole (PRILOSEC) 20 MG capsule Take 20 mg by mouth daily.   Marland Kitchen oxybutynin (DITROPAN-XL) 5 MG 24 hr tablet Take 5 mg by mouth at bedtime.  . Potassium 99 MG TABS Take by mouth.  . progesterone (PROMETRIUM) 200 MG capsule TAKE ONE CAPSULE BY MOUTH ONCE DAILY.  Marland Kitchen sertraline (ZOLOFT) 100 MG tablet Take 150 mg by mouth every morning.   Marland Kitchen tiZANidine (ZANAFLEX) 4 MG tablet TAKE 1/2-1 TABLET BY MOUTH AT BEDTIME.  . fluticasone (FLONASE) 50 MCG/ACT nasal spray Place 1 spray into both nostrils daily for 14 days.   No current facility-administered medications on file prior to visit.     Allergies:   Clarithromycin and Topamax [topiramate]   Social History   Tobacco Use  . Smoking status: Former Smoker    Years: 14.00    Types: Cigarettes    Quit date: 09/07/2009    Years since quitting: 10.5  . Smokeless tobacco: Never Used  Vaping Use  . Vaping Use: Never used  Substance Use Topics  . Alcohol use: Yes    Comment: occ mixed drink  . Drug use: No    Family History: family history includes Asthma in her father and mother; Atrial fibrillation in her mother; Bipolar disorder in her mother; Cancer in her maternal grandmother, mother, and paternal grandmother; Depression in her mother; Diabetes in her paternal grandmother; Heart disease in her maternal grandfather, maternal grandmother, and paternal grandmother; Hypertension in her father, maternal grandmother, and paternal grandfather; Kidney disease in her paternal grandmother; Other in her maternal grandmother and paternal grandfather; Pancreatitis in her maternal grandfather; Seizures in her mother; Stroke in her paternal grandfather; Urticaria in her son. There is no history of Allergic rhinitis, Angioedema, Eczema, or  Immunodeficiency.  ROS:   Please see the history of present illness.  Additional pertinent ROS: Constitutional: Negative for chills, fever, unintentional weight loss  HENT: Negative for ear pain and hearing loss.   Eyes: Negative for loss of vision and eye pain.  Respiratory: Negative for cough, sputum, wheezing.   Cardiovascular: See HPI. Gastrointestinal: Negative for abdominal pain, melena, and hematochezia.  Genitourinary: Negative for dysuria and hematuria.  Musculoskeletal: Negative for falls and myalgias.  Skin: Negative for itching and rash.  Neurological: Negative for focal weakness, focal sensory changes and loss of consciousness.  Endo/Heme/Allergies: Does not bruise/bleed easily.     EKGs/Labs/Other Studies Reviewed:    The following studies were reviewed today: No prior cardiac studies  EKG:  EKG is personally reviewed.  The ekg ordered today demonstrates normal sinus rhythm at 80 bpm  Recent Labs: No results found for requested labs within last 8760 hours.  Recent Lipid Panel No results found for: CHOL, TRIG, HDL, CHOLHDL, VLDL, LDLCALC, LDLDIRECT  Physical Exam:  VS:  BP 125/80   Pulse 80   Temp (!) 97.2 F (36.2 C)   Ht 5\' 7"  (1.702 m)   Wt 300 lb (136.1 kg)   LMP 09/26/2015 (Exact Date)   SpO2 98%   BMI 46.99 kg/m     Wt Readings from Last 3 Encounters:  03/08/20 300 lb (136.1 kg)  01/22/20 (!) 306 lb 7 oz (139 kg)  11/28/19 (!) 308 lb (139.7 kg)    GEN: Well nourished, well developed in no acute distress HEENT: Normal, moist mucous membranes NECK: No JVD CARDIAC: regular rhythm, normal S1 and S2, no rubs or gallops. No murmurs. VASCULAR: Radial and DP pulses 2+ bilaterally. No carotid bruits RESPIRATORY:  Clear to auscultation without rales, wheezing or rhonchi  ABDOMEN: Soft, non-tender, non-distended MUSCULOSKELETAL:  Ambulates independently SKIN: Warm and dry, no edema NEUROLOGIC:  Alert and oriented x 3. No focal neuro deficits  noted. PSYCHIATRIC:  Normal affect    ASSESSMENT:    1. Precordial pain   2. Hyperlipidemia, unspecified hyperlipidemia type   3. Class 3 severe obesity due to excess calories without serious comorbidity with body mass index (BMI) of 45.0 to 49.9 in adult [facility])   4. Cardiac risk counseling   5. Counseling on health promotion and disease prevention    PLAN:    Precordial pain: -Based on shared decision making, decision was made to pursue exercise treadmill stress test. Counseled on the risk of false positive results -We also discussed that if testing is unrevealing for a cardiac cause of the symptoms, there are many noncardiac causes as well that can contribute to symptoms. If the heart is ruled out, then I recommend returning to PCP to discuss alternative diagnoses. -instructed on red flag warning signs that need immediate medical attention -10 year ASCVD risk 1.2% as below  Hyperlipidemia: -per records from Dr. Delanna Ahmadi office, from 01/06/20 -Tchol 215, TG 149, HDL 41, LDL 147  Obesity: -BMI 47 -will rule out cardiac etiology, then would focus on lifestyle change  Cardiac risk counseling and prevention recommendations: -recommend heart healthy/Mediterranean diet, with whole grains, fruits, vegetable, fish, lean meats, nuts, and olive oil. Limit salt. -recommend moderate walking, 3-5 times/week for 30-50 minutes each session. Aim for at least 150 minutes.week. Goal should be pace of 3 miles/hours, or walking 1.5 miles in 30 minutes -recommend avoidance of tobacco products. Avoid excess alcohol.:  -ASCVD risk score: 10 year risk 1.2% The ASCVD Risk score Mikey Bussing DC Jr., et al., 2013) failed to calculate for the following reasons:   Cannot find a previous HDL lab   Cannot find a previous total cholesterol lab    Plan for follow up: if treadmill unremarkable, can follow up as needed  Buford Dresser, MD, PhD   [facility]    Medication Adjustments/Labs and  Tests Ordered: Current medicines are reviewed at length with the patient today.  Concerns regarding medicines are outlined above.  Orders Placed This Encounter  Procedures  . EXERCISE TOLERANCE TEST (ETT)  . EKG 12-Lead   No orders of the defined types were placed in this encounter.   Patient Instructions  Medication Instructions:  Your Physician recommend you continue on your current medication as directed.    *If you need a refill on your cardiac medications before your next appointment, please call your pharmacy*   Lab Work: None ordered    Testing/Procedures: Your physician has requested that you have an exercise tolerance test. For further information please visit [email or web address]. Please also  follow instruction sheet, as given. [place]. [address]  You will need to have the coronavirus test completed prior to your procedure.  This is a Drive Up Visit at [address], [place], [place] [address]. Please tell them that you are there for procedure testing. Stay in your car and someone will be with you shortly. Please make sure to have all other labs completed before this test because you will need to stay quarantined until your procedure.  Follow-Up: At [facility], you and your health needs are our priority.  As part of our continuing mission to provide you with exceptional heart care, we have created designated Provider Care Teams.  These Care Teams include your primary Cardiologist (physician) and Advanced Practice Providers (APPs -  Physician Assistants and Nurse Practitioners) who all work together to provide you with the care you need, when you need it.  We recommend signing up for the patient portal called "MyChart".  Sign up information is provided on this After Visit Summary.  MyChart is used to connect with patients for Virtual Visits (Telemedicine).  Patients are able to view lab/test results, encounter notes, upcoming appointments, etc.  Non-urgent  messages can be sent to your provider as well.   To learn more about what you can do with MyChart, go to [email or web address].    Your next appointment:   As needed  The format for your next appointment:   In Person  Provider:   Buford Dresser, MD    [facility] Cardiovascular Imaging at [facility] [address] [place], [place] [address] Phone:  [phone]        You are scheduled for an Exercise Stress Test  Please arrive 15 minutes prior to your appointment time for registration and insurance purposes.  The test will take approximately 45 minutes to complete.  How to prepare for your Exercise Stress Test: . Do bring a list of your current medications with you.  If not listed below, you may take your medications as normal. . Do wear comfortable clothes (no dresses or overalls) and walking shoes, tennis shoes preferred (no heels or open toed shoes are allowed) . Do Not wear cologne, perfume, aftershave or lotions (deodorant is allowed). . Please report to [place], [address] for your test.  If these instructions are not followed, your test will have to be rescheduled.  If you have questions or concerns about your appointment, you can call the Stress Lab at [phone].  If you cannot keep your appointment, please provide 24 hours notification to the Stress Lab, to avoid a possible $50 charge to your account    Signed, Buford Dresser, MD PhD 03/08/2020 1:12 PM    [place]

## 2020-03-08 NOTE — Patient Instructions (Addendum)
Medication Instructions:  Your Physician recommend you continue on your current medication as directed.    *If you need a refill on your cardiac medications before your next appointment, please call your pharmacy*   Lab Work: None ordered    Testing/Procedures: Your physician has requested that you have an exercise tolerance test. For further information please visit [email or web address]. Please also follow instruction sheet, as given. [place]. [address]  You will need to have the coronavirus test completed prior to your procedure.  This is a Drive Up Visit at [address], [place], [place] [address]. Please tell them that you are there for procedure testing. Stay in your car and someone will be with you shortly. Please make sure to have all other labs completed before this test because you will need to stay quarantined until your procedure.  Follow-Up: At [facility], you and your health needs are our priority.  As part of our continuing mission to provide you with exceptional heart care, we have created designated Provider Care Teams.  These Care Teams include your primary Cardiologist (physician) and Advanced Practice Providers (APPs -  Physician Assistants and Nurse Practitioners) who all work together to provide you with the care you need, when you need it.  We recommend signing up for the patient portal called "MyChart".  Sign up information is provided on this After Visit Summary.  MyChart is used to connect with patients for Virtual Visits (Telemedicine).  Patients are able to view lab/test results, encounter notes, upcoming appointments, etc.  Non-urgent messages can be sent to your provider as well.   To learn more about what you can do with MyChart, go to [email or web address].    Your next appointment:   As needed  The format for your next appointment:   In Person  Provider:   Buford Dresser, MD    [facility] Cardiovascular Imaging at  [facility] [address], [place], [place] [address] Phone:  [phone]        You are scheduled for an Exercise Stress Test  Please arrive 15 minutes prior to your appointment time for registration and insurance purposes.  The test will take approximately 45 minutes to complete.  How to prepare for your Exercise Stress Test: . Do bring a list of your current medications with you.  If not listed below, you may take your medications as normal. . Do wear comfortable clothes (no dresses or overalls) and walking shoes, tennis shoes preferred (no heels or open toed shoes are allowed) . Do Not wear cologne, perfume, aftershave or lotions (deodorant is allowed). . Please report to [place], [address] for your test.  If these instructions are not followed, your test will have to be rescheduled.  If you have questions or concerns about your appointment, you can call the Stress Lab at [phone].  If you cannot keep your appointment, please provide 24 hours notification to the Stress Lab, to avoid a possible $50 charge to your account

## 2020-03-10 ENCOUNTER — Other Ambulatory Visit (HOSPITAL_COMMUNITY)
Admission: RE | Admit: 2020-03-10 | Discharge: 2020-03-10 | Disposition: A | Discharge status: Home / Self Care | Payer: 59 | Source: Ambulatory Visit | Attending: Cardiology | Admitting: Cardiology

## 2020-03-10 DIAGNOSIS — Z20822 Contact with and (suspected) exposure to covid-19: Secondary | ICD-10-CM | POA: Insufficient documentation

## 2020-03-10 DIAGNOSIS — Z01812 Encounter for preprocedural laboratory examination: Secondary | ICD-10-CM | POA: Insufficient documentation

## 2020-03-10 LAB — SARS CORONAVIRUS 2 (TAT 6-24 HRS): SARS Coronavirus 2: NEGATIVE

## 2020-03-13 ENCOUNTER — Telehealth (HOSPITAL_COMMUNITY): Payer: Self-pay | Admitting: *Deleted

## 2020-03-13 NOTE — Telephone Encounter (Signed)
Close encounter 

## 2020-03-14 ENCOUNTER — Ambulatory Visit (HOSPITAL_COMMUNITY)
Admission: RE | Admit: 2020-03-14 | Discharge: 2020-03-14 | Disposition: A | Discharge status: Home / Self Care | Payer: 59 | Source: Ambulatory Visit | Attending: Cardiology | Admitting: Cardiology

## 2020-03-14 ENCOUNTER — Other Ambulatory Visit: Payer: Self-pay

## 2020-03-14 DIAGNOSIS — R072 Precordial pain: Secondary | ICD-10-CM | POA: Diagnosis not present

## 2020-03-14 LAB — EXERCISE TOLERANCE TEST
Estimated workload: 7.2 METS
Exercise duration (min): 6 min
Exercise duration (sec): 0 s
MPHR: 179 {beats}/min
Peak HR: 157 {beats}/min
Percent HR: 87 %
Rest HR: 85 {beats}/min

## 2020-04-11 ENCOUNTER — Other Ambulatory Visit: Payer: Self-pay | Admitting: Obstetrics & Gynecology

## 2020-04-11 DIAGNOSIS — Z0000 Encounter for general adult medical examination without abnormal findings: Secondary | ICD-10-CM

## 2020-04-19 ENCOUNTER — Ambulatory Visit (INDEPENDENT_AMBULATORY_CARE_PROVIDER_SITE_OTHER): Payer: 59 | Admitting: Neurology

## 2020-04-19 ENCOUNTER — Other Ambulatory Visit: Payer: Self-pay

## 2020-04-19 ENCOUNTER — Encounter: Payer: Self-pay | Admitting: Neurology

## 2020-04-19 VITALS — BP 151/83 | HR 62 | Resp 20 | Ht 67.0 in | Wt 302.0 lb

## 2020-04-19 DIAGNOSIS — G43709 Chronic migraine without aura, not intractable, without status migrainosus: Secondary | ICD-10-CM | POA: Diagnosis not present

## 2020-04-19 DIAGNOSIS — R29898 Other symptoms and signs involving the musculoskeletal system: Secondary | ICD-10-CM | POA: Diagnosis not present

## 2020-04-19 DIAGNOSIS — M542 Cervicalgia: Secondary | ICD-10-CM

## 2020-04-19 DIAGNOSIS — G5603 Carpal tunnel syndrome, bilateral upper limbs: Secondary | ICD-10-CM | POA: Diagnosis not present

## 2020-04-19 MED ORDER — GABAPENTIN 100 MG PO CAPS: ORAL_CAPSULE | ORAL | 0 refills | Status: DC

## 2020-04-19 NOTE — Progress Notes (Signed)
NEUROLOGY FOLLOW UP OFFICE NOTE  Julie [name] [id number]   Subjective:  Julie [name] is a 44 year old female with depression, anxiety and kidney stone who follows up for migraine.  UPDATE: Last seen in December 2019.  At that time, I started her on Aimovig.  She was subsequently lost to follow up.  They improved, once a month.  However, headaches have been worse over past 6 months.  Again, posterior down bilateral posterior neck and into shoulders.  Massage therapy and tizanidine ineffective.  Arms feel heavy.  She also has intermittent numbness in the hands, either at rest or with action.  Headaches are now daily.  She also says her vision is more blurred.  Due to insurance, she cannot get an eye exam until next month.   Current NSAIDS: None Current analgesics: Excedrin Migraine (rarely, ineffective) Current triptans: None Current ergotamine: None Current anti-emetic: None Current muscle relaxants: tizanidine 4mg  QHS Current anti-anxiolytic: Alprazolam 1 mg as needed Current sleep aide: None Current Antihypertensive medications: None Current Antidepressant medications: Sertraline 100 mg daily Current Anticonvulsant medications: None Current anti-CGRP: Aimovig 140mg  monthly Current Vitamins/Herbal/Supplements: None Current Antihistamines/Decongestants: None Other therapy: Daith piercing bilaterally, chiropractor Hormone/birth control: progesterone  Caffeine: One cup of coffee daily, tea at dinner Alcohol: Occasional Smoker: No Diet: 4 glasses of water daily; does not eat breakfast. Exercise: Not routine Depression: Yes; Anxiety: Yes Other pain: Diffuse body aches Sleep hygiene: Sleeps but not rested.  Sleep study negative for OSA.  HISTORY: Onset: She developed migraines in high school but resolved.  Migraines returned at age 72 years old after birth of her son. Location:Back of head bilaterally, top of head, radiating down neck into  shoulders Quality:Vice-grip Initial intensity:5-10/10 (usually 10/10).Shedenies new headache, thunderclap headache Aura:Sometimes phantosmia (chemicals, exhaust, burning) Prodrome:no Postdrome:fatigue Associated symptoms: Nausea, photophobia, phonophobia, blurred vision.Shedenies associated unilateral numbness or weakness. Initial duration:Constant. Often wakes up with it. Initial Frequency:daily Initial Frequency of abortive medication:none Triggers: None Relieving factors:Applying pressure to upper paraspinal region, resting in dark and quiet place Activity:aggravates  Workup included MRI of brain without contrast from 10//19/16, which was personally reviewedand was unremarkable.  Past NSAIDS:Ibuprofen, naproxen, indomethacin 50mg  three times daily, Toradol shot (helps) Past analgesics:Fioricet with codeine, Excedrin, Tylenol Past abortive triptans:Sumatriptan 100mg (made her sick), rizatriptan 10mg , eletritan 40mg  Past abortive ergotamine:Cafergot (caused throat swelling) Past muscle relaxants:Flexeril Past anti-emetic:Zofran (sometimes helps) Past antihypertensive medications:Propranolol 80mg  twice daily Past antidepressant medications:Amitriptyline 25mg  at bedtime, Wellbutrin Past anticonvulsant medications:topiramate 150mg , zonisamide 25mg  at bedtime Past anti-CGRP:Nurtec, Roselyn Meier Past vitamins/Herbal/Supplements:none Past antihistamines/decongestants:none Other past therapies:Trigger point/nerve blocks, cognitive behavioral therapy, acupuncture, biofeedback, Botox (1 round, stopped due to cost related to change in insurance).  Family history of headache:No  PAST MEDICAL HISTORY: Past Medical History:  Diagnosis Date  . Anemia   . Anemia   . Anxiety   . Breast discharge 07/01/2013   Pt noticed discharge, none on exam today will check TSH and Prolactin  . Breast mass, right 10/20/2012  . Depression   . Dysmenorrhea  09/19/2015  . Dyspareunia in female 09/19/2015  . Ectopic pregnancy   . Eczema   . GERD (gastroesophageal reflux disease)    no meds  . Headache   . Hernia of abdominal wall 01/04/2013  . Kidney stone   . Left breast mass 04/12/2014   Has pea sized nodule at 6 o'clock tender and mobile, will get mammogram and [place] if needed  . Menorrhagia with regular cycle 09/19/2015  . Pelvic pain in female 09/19/2015  . Placenta  previa 2012   Current pregnancy   . Urinary tract infection   . Vaginal discharge 07/01/2013    MEDICATIONS: Current Outpatient Medications on File Prior to Visit  Medication Sig Dispense Refill  . ALPRAZolam (XANAX) 1 MG tablet Take 0.5-1 mg by mouth 3 (three) times daily as needed for anxiety.    . Ascorbic Acid (VITAMIN C) 1000 MG tablet Take 1,000 mg by mouth daily.    Marland Kitchen buPROPion (WELLBUTRIN XL) 150 MG 24 hr tablet Take 150 mg by mouth every morning.    . fluticasone (FLONASE) 50 MCG/ACT nasal spray Place 1 spray into both nostrils daily for 14 days. 15.8 mL 0  . furosemide (LASIX) 40 MG tablet Take 40 mg by mouth as needed.     Marland Kitchen levocetirizine (XYZAL) 5 MG tablet TAKE ONE TABLET BY MOUTH EVERY EVENING. (Patient taking differently: Take 5 mg by mouth every evening. ) 30 tablet 1  . omeprazole (PRILOSEC) 20 MG capsule Take 20 mg by mouth daily.   4  . oxybutynin (DITROPAN-XL) 5 MG 24 hr tablet Take 5 mg by mouth at bedtime.    . Potassium 99 MG TABS Take by mouth.    . progesterone (PROMETRIUM) 200 MG capsule TAKE ONE CAPSULE BY MOUTH ONCE DAILY. 30 capsule 11  . sertraline (ZOLOFT) 100 MG tablet Take 150 mg by mouth every morning.     Marland Kitchen tiZANidine (ZANAFLEX) 4 MG tablet TAKE 1/2-1 TABLET BY MOUTH AT BEDTIME. 30 tablet 2   No current facility-administered medications on file prior to visit.    ALLERGIES: Allergies  Allergen Reactions  . Clarithromycin Other (See Comments)    Makes throat raw  . Topamax [Topiramate] Itching    FAMILY HISTORY: Family History   Problem Relation Age of Onset  . Depression Mother   . Cancer Mother   . Seizures Mother   . Bipolar disorder Mother   . Asthma Mother   . Atrial fibrillation Mother   . Hypertension Father   . Asthma Father   . Heart disease Maternal Grandmother   . Cancer Maternal Grandmother        oral; lymph nodes  . Hypertension Maternal Grandmother   . Other Maternal Grandmother        memory problems  . Stroke Paternal Grandfather   . Hypertension Paternal Grandfather   . Other Paternal Grandfather        brain tumors  . Heart disease Maternal Grandfather   . Pancreatitis Maternal Grandfather   . Cancer Paternal Grandmother        breast  . Heart disease Paternal Grandmother   . Kidney disease Paternal Grandmother        kidney failure  . Diabetes Paternal Grandmother   . Urticaria Son   . Allergic rhinitis Neg Hx   . Angioedema Neg Hx   . Eczema Neg Hx   . Immunodeficiency Neg Hx     SOCIAL HISTORY: Social History   Socioeconomic History  . Marital status: Married    Spouse name: Not on file  . Number of children: 1  . Years of education: Not on file  . Highest education level: Not on file  Occupational History  . Not on file  Tobacco Use  . Smoking status: Former Smoker    Years: 14.00    Types: Cigarettes    Quit date: 09/07/2009    Years since quitting: 10.6  . Smokeless tobacco: Never Used  Vaping Use  . Vaping Use: Never used  Substance and Sexual Activity  . Alcohol use: Yes    Comment: occ mixed drink  . Drug use: No  . Sexual activity: Yes    Birth control/protection: Surgical    Comment: hyst  Other Topics Concern  . Not on file  Social History Narrative  . Not on file   Social Determinants of Health   Financial Resource Strain:   . Difficulty of Paying Living Expenses: Not on file  Food Insecurity:   . Worried About Charity fundraiser in the Last Year: Not on file  . Ran Out of Food in the Last Year: Not on file  Transportation Needs:   .  Lack of Transportation (Medical): Not on file  . Lack of Transportation (Non-Medical): Not on file  Physical Activity:   . Days of Exercise per Week: Not on file  . Minutes of Exercise per Session: Not on file  Stress:   . Feeling of Stress : Not on file  Social Connections:   . Frequency of Communication with Friends and Family: Not on file  . Frequency of Social Gatherings with Friends and Family: Not on file  . Attends Religious Services: Not on file  . Active Member of Clubs or Organizations: Not on file  . Attends Archivist Meetings: Not on file  . Marital Status: Not on file  Intimate Partner Violence:   . Fear of Current or Ex-Partner: Not on file  . Emotionally Abused: Not on file  . Physically Abused: Not on file  . Sexually Abused: Not on file     Objective:  Blood pressure (!) 151/83, pulse 62, resp. rate 20, height 5\' 7"  (1.702 m), weight (!) 302 lb (137 kg), last menstrual period 09/26/2015, SpO2 98 %. General: No acute distress.  Patient appears well-groomed.   Head:  Normocephalic/atraumatic Eyes:  Fundi examined but not visualized Neck: supple, no paraspinal tenderness, full range of motion Heart:  Regular rate and rhythm Lungs:  Clear to auscultation bilaterally Back: No paraspinal tenderness Neurological Exam: alert and oriented to person, place, and time. Attention span and concentration intact, recent and remote memory intact, fund of knowledge intact.  Speech fluent and not dysarthric, language intact.  CN II-XII intact. Bulk and tone normal, muscle strength 5/5 throughout.  Sensation to light touch, temperature and vibration intact.  Tinel's test positive in both wrists.  Deep tendon reflexes 2+ throughout, toes downgoing.  Finger to nose and heel to shin testing intact.  Gait normal, Romberg negative.   Assessment/Plan:   1.  Migraine without aura, without status migrainosus, not intractable, suspect cervicogenic etiology 2.  Cervicalgia 3.   Probable carpal tunnel syndrome.  She had previously responded well to [place].  I think her current headaches are aggravated by her neck pain.  Therefore, I would focus on treating her neck pain. 1.  Start gabapentin 100mg , titrating to 300mg  at bedtime.  We can increase dose to 300mg  twice daily in 8 weeks if needed. 2.  Stop tizanidine 3.  Check MRI of cervical spine without contrast (failed conservative therapy) 4.  Continue Aimovig 140mg  monthly 5.  Advised to wear wrist splints on both wrists. 6.  Follow up 4 to 6 months.  Metta Clines, DO  CC: Sharilyn Sites, MD

## 2020-04-19 NOTE — Patient Instructions (Signed)
1.  Continue Aimovig 140mg  monthly 2.  Start gabapentin 100mg  at bedtime for one week, then 200mg  at bedtime for one week, then 300mg  at bedtime.  If no improvement in 8 weeks, contact me. 3.  Stop tizanidine 4.  Will get MRI of cervical spine without contrast 5.  Buy 2 wrist splints, one for each wrist.  Wear at night and as much as possible during the day. 6.  Follow up in 4 to 6 months but further recommendations pending results.

## 2020-04-30 ENCOUNTER — Other Ambulatory Visit: Payer: Self-pay

## 2020-04-30 ENCOUNTER — Telehealth: Payer: Self-pay | Admitting: Neurology

## 2020-04-30 DIAGNOSIS — G43709 Chronic migraine without aura, not intractable, without status migrainosus: Secondary | ICD-10-CM

## 2020-04-30 DIAGNOSIS — M542 Cervicalgia: Secondary | ICD-10-CM

## 2020-04-30 NOTE — Telephone Encounter (Signed)
Called patient and informed her that per last visit Dr. Tomi Likens wants a MRI Cervical Spine w/o. Informed patient that order was sent and PA sent. Pt aware that Pa can take some time and will call [facility] to schedule once pa complete.

## 2020-04-30 NOTE — Progress Notes (Signed)
Order created and sent to [facility] imaging

## 2020-04-30 NOTE — Telephone Encounter (Signed)
Patient called in stating she thought Dr. Tomi Likens wanted her to get an MRI for her neck, but [facility] says they do not have an order. Can an order be put in if it is needed?

## 2020-05-14 ENCOUNTER — Ambulatory Visit: Payer: 59

## 2020-05-19 ENCOUNTER — Ambulatory Visit: Admit: 2020-05-19 | Discharge status: Absent without leave | Payer: 59

## 2020-05-19 ENCOUNTER — Other Ambulatory Visit: Payer: Self-pay

## 2020-05-19 ENCOUNTER — Ambulatory Visit
Admission: EM | Admit: 2020-05-19 | Discharge: 2020-05-19 | Disposition: A | Discharge status: Home / Self Care | Payer: 59 | Attending: Internal Medicine | Admitting: Internal Medicine

## 2020-05-19 DIAGNOSIS — Z1152 Encounter for screening for COVID-19: Secondary | ICD-10-CM

## 2020-05-19 DIAGNOSIS — R6889 Other general symptoms and signs: Secondary | ICD-10-CM

## 2020-05-19 NOTE — ED Triage Notes (Signed)
Pt presents with cough and feeling unwell that began this morning

## 2020-05-19 NOTE — ED Provider Notes (Signed)
[facility]    CSN: [id number] Arrival date & time: 05/19/20  1356      History   Chief Complaint No chief complaint on file.   HPI Julie [name] is a 44 y.o. female comes to the urgent care with a 1 day history of general malaise and a nonproductive cough as well as a fever.  Patient symptoms started  this morning and hence the visit to the urgent care.  Both children exhibiting similar symptoms.  No nausea, vomiting or diarrhea.  Patient is fully vaccinated against COVID-19 virus.  HPI  Past Medical History:  Diagnosis Date  . Anemia   . Anemia   . Anxiety   . Breast discharge 07/01/2013   Pt noticed discharge, none on exam today will check TSH and Prolactin  . Breast mass, right 10/20/2012  . Depression   . Dysmenorrhea 09/19/2015  . Dyspareunia in female 09/19/2015  . Ectopic pregnancy   . Eczema   . GERD (gastroesophageal reflux disease)    no meds  . Headache   . Hernia of abdominal wall 01/04/2013  . Kidney stone   . Left breast mass 04/12/2014   Has pea sized nodule at 6 o'clock tender and mobile, will get mammogram and [place] if needed  . Menorrhagia with regular cycle 09/19/2015  . Pelvic pain in female 09/19/2015  . Placenta previa 2012   Current pregnancy   . Urinary tract infection   . Vaginal discharge 07/01/2013    Patient Active Problem List   Diagnosis Date Noted  . Ovarian cyst 01/10/2019  . S/P hysterectomy 10/10/2015  . Dyspareunia in female 09/19/2015  . Dysmenorrhea 09/19/2015  . Left breast mass 04/12/2014  . Hernia of abdominal wall 01/04/2013  . Breast mass, right 10/20/2012    Past Surgical History:  Procedure Laterality Date  . ABDOMINAL HYSTERECTOMY N/A 10/10/2015   Procedure: HYSTERECTOMY ABDOMINAL;  Surgeon: Florian Buff, MD;  Location: [facility];  Service: Gynecology;  Laterality: N/A;  . APPENDECTOMY    . APPENDECTOMY    . BILATERAL SALPINGECTOMY Bilateral 10/10/2015   Procedure: BILATERAL SALPINGECTOMY;  Surgeon: Florian Buff, MD;  Location: [facility];  Service: Gynecology;  Laterality: Bilateral;  . BREAST BIOPSY Right   . CESAREAN SECTION  04/24/2011   Procedure: CESAREAN SECTION;  Surgeon: Florian Buff, MD;  Location: [facility];  Service: Gynecology;  Laterality: N/A;  Primary/Previa  . CHOLECYSTECTOMY    . COLONOSCOPY    . CYSTOSCOPY W/ URETERAL STENT PLACEMENT Left 08/04/2014   Procedure: CYSTOSCOPY WITH LEFT  RETROGRADE PYELOGRAM/ LEFT URETERAL STENT PLACEMENT;  Surgeon: Festus Aloe, MD;  Location: [facility];  Service: Urology;  Laterality: Left;  . TONSILLECTOMY    . UPPER GI ENDOSCOPY      OB History    Gravida  2   Para  1   Term  0   Preterm  1   AB  1   Living  1     SAB  0   IAB  0   Ectopic  1   Multiple  0   Live Births  1            Home Medications    Prior to Admission medications   Medication Sig Start Date End Date Taking? Authorizing Provider  AIMOVIG 140 MG/ML SOAJ Inject into the skin every 30 (thirty) days. 04/02/20   [provider]  ALPRAZolam Duanne Moron) 1 MG tablet Take 0.5-1 mg by mouth 3 (three)  times daily as needed for anxiety.    [provider]  Ascorbic Acid (VITAMIN C) 1000 MG tablet Take 1,000 mg by mouth daily.    [provider]  buPROPion (WELLBUTRIN XL) 150 MG 24 hr tablet Take 150 mg by mouth every morning. 11/25/19   [provider]  fluticasone (FLONASE) 50 MCG/ACT nasal spray Place 1 spray into both nostrils daily for 14 days. 01/22/20 02/05/20  Avegno, Darrelyn Hillock, FNP  furosemide (LASIX) 40 MG tablet Take 40 mg by mouth as needed.     [provider]  gabapentin (NEURONTIN) 100 MG capsule Take 1 capsule at bedtime for one week, then 2 capsules at bedtime for one week, then 3 capsules at bedtime 04/19/20   Jaffe, Adam R, DO  levocetirizine (XYZAL) 5 MG tablet TAKE ONE TABLET BY MOUTH EVERY EVENING. Patient taking differently: Take 5 mg by mouth every evening.  03/24/17   Valentina Shaggy, MD   omeprazole (PRILOSEC) 20 MG capsule Take 20 mg by mouth daily.  09/22/15   [provider]  oxybutynin (DITROPAN-XL) 5 MG 24 hr tablet Take 5 mg by mouth at bedtime.    [provider]  Potassium 99 MG TABS Take by mouth.    [provider]  progesterone (PROMETRIUM) 200 MG capsule TAKE ONE CAPSULE BY MOUTH ONCE DAILY. 11/25/19   Florian Buff, MD  sertraline (ZOLOFT) 100 MG tablet Take 150 mg by mouth every morning.     [provider]  Vitamin D, Ergocalciferol, (DRISDOL) 1.25 MG (50000 UNIT) CAPS capsule Take by mouth. 01/16/20   [provider]    Family History Family History  Problem Relation Age of Onset  . Depression Mother   . Cancer Mother   . Seizures Mother   . Bipolar disorder Mother   . Asthma Mother   . Atrial fibrillation Mother   . Hypertension Father   . Asthma Father   . Heart disease Maternal Grandmother   . Cancer Maternal Grandmother        oral; lymph nodes  . Hypertension Maternal Grandmother   . Other Maternal Grandmother        memory problems  . Stroke Paternal Grandfather   . Hypertension Paternal Grandfather   . Other Paternal Grandfather        brain tumors  . Heart disease Maternal Grandfather   . Pancreatitis Maternal Grandfather   . Cancer Paternal Grandmother        breast  . Heart disease Paternal Grandmother   . Kidney disease Paternal Grandmother        kidney failure  . Diabetes Paternal Grandmother   . Urticaria Son   . Allergic rhinitis Neg Hx   . Angioedema Neg Hx   . Eczema Neg Hx   . Immunodeficiency Neg Hx     Social History Social History   Tobacco Use  . Smoking status: Former Smoker    Years: 14.00    Types: Cigarettes    Quit date: 09/07/2009    Years since quitting: 10.7  . Smokeless tobacco: Never Used  Vaping Use  . Vaping Use: Never used  Substance Use Topics  . Alcohol use: Yes    Comment: occ mixed drink  . Drug use: No     Allergies   Clarithromycin and  Topamax [topiramate]   Review of Systems Review of Systems  Constitutional: Positive for activity change, fatigue and fever.  HENT: Negative for congestion, sore throat and tinnitus.  Respiratory: Negative for cough.      Physical Exam Triage Vital Signs ED Triage Vitals  Enc Vitals Group     BP 05/19/20 1530 116/78     Pulse Rate 05/19/20 1530 84     Resp 05/19/20 1530 18     Temp 05/19/20 1530 98.2 F (36.8 C)     Temp src --      SpO2 05/19/20 1530 96 %     Weight --      Height --      Head Circumference --      Peak Flow --      Pain Score 05/19/20 1533 5     Pain Loc --      Pain Edu? --      Excl. in [place]? --    No data found.  Updated Vital Signs BP 116/78   Pulse 84   Temp 98.2 F (36.8 C)   Resp 18   LMP 09/26/2015 (Exact Date)   SpO2 96%   Visual Acuity Right Eye Distance:   Left Eye Distance:   Bilateral Distance:    Right Eye Near:   Left Eye Near:    Bilateral Near:     Physical Exam   UC Treatments / Results  Labs (all labs ordered are listed, but only abnormal results are displayed) Labs Reviewed  COVID-19, FLU A+B NAA    EKG   Radiology No results found.  Procedures Procedures (including critical care time)  Medications Ordered in UC Medications - No data to display  Initial Impression / Assessment and Plan / UC Course  I have reviewed the triage vital signs and the nursing notes.  Pertinent labs & imaging results that were available during my care of the patient were reviewed by me and considered in my medical decision making (see chart for details).     1.  Flulike symptoms: COVID-19/influenza AMB PCR sent Tylenol/Motrin as needed for generalized body aches or fever Increase oral fluid intake If your test is abnormal we will call you with recommendations. Return precautions given Final Clinical Impressions(s) / UC Diagnoses   Final diagnoses:  Flu-like symptoms     Discharge Instructions     Increase oral  fluid intake Tylenol/Motrin as needed for fever and/or body aches Please sign up for my chart so he can review your lab results We will call you with recommendations of your lab results are normal.   ED Prescriptions    None     PDMP not reviewed this encounter.   Chase Picket, MD 05/19/20 1623

## 2020-05-19 NOTE — Discharge Instructions (Signed)
Increase oral fluid intake Tylenol/Motrin as needed for fever and/or body aches Please sign up for my chart so he can review your lab results We will call you with recommendations of your lab results are normal.

## 2020-05-20 LAB — COVID-19, FLU A+B NAA
Influenza A, NAA: NOT DETECTED
Influenza B, NAA: NOT DETECTED
SARS-CoV-2, NAA: NOT DETECTED

## 2020-05-21 ENCOUNTER — Other Ambulatory Visit: Payer: 59

## 2020-05-23 ENCOUNTER — Other Ambulatory Visit: Payer: Self-pay

## 2020-05-23 ENCOUNTER — Other Ambulatory Visit: Payer: Self-pay | Admitting: Neurology

## 2020-05-23 MED ORDER — GABAPENTIN 100 MG PO CAPS
100.0000 mg | ORAL_CAPSULE | Freq: Every day | ORAL | 6 refills | Status: DC
Start: 2020-05-23 — End: 2020-10-30

## 2020-05-23 NOTE — Telephone Encounter (Signed)
We can refill and change gabapentin to 400mg  at bedtime.

## 2020-05-24 ENCOUNTER — Other Ambulatory Visit: Payer: Self-pay

## 2020-05-24 ENCOUNTER — Ambulatory Visit
Admission: EM | Admit: 2020-05-24 | Discharge: 2020-05-24 | Disposition: A | Discharge status: Home / Self Care | Payer: 59 | Attending: Internal Medicine | Admitting: Internal Medicine

## 2020-05-24 DIAGNOSIS — H6591 Unspecified nonsuppurative otitis media, right ear: Secondary | ICD-10-CM

## 2020-05-24 MED ORDER — HYDROCOD POLST-CPM POLST ER 10-8 MG/5ML PO SUER: 5.0000 mL | Freq: Two times a day (BID) | ORAL | 0 refills | Status: DC | PRN

## 2020-05-24 MED ORDER — HYDROCOD POLST-CPM POLST ER 10-8 MG/5ML PO SUER: 5.0000 mL | Freq: Two times a day (BID) | ORAL | Status: DC | PRN

## 2020-05-24 NOTE — Discharge Instructions (Signed)
Continue using saline nasal spray Take cough medications as prescribed Humidifier use will be helpful If you have worsening symptoms please return to urgent care.

## 2020-05-24 NOTE — ED Provider Notes (Signed)
[facility]    CSN: [id number] Arrival date & time: 05/24/20  1022      History   Chief Complaint Chief Complaint  Patient presents with  . Nasal Congestion    HPI Julie [name] is a 44 y.o. female comes to the urgent care with persistent cough and right ear pressure.  Patient was diagnosed with viral illness a few days ago.  She is negative for flu and Covid 19.  No fever or chills.  She continues to have a cough which is minimally productive.  No dizziness.  She has fullness in the right ear with some pain.   HPI  Past Medical History:  Diagnosis Date  . Anemia   . Anemia   . Anxiety   . Breast discharge 07/01/2013   Pt noticed discharge, none on exam today will check TSH and Prolactin  . Breast mass, right 10/20/2012  . Depression   . Dysmenorrhea 09/19/2015  . Dyspareunia in female 09/19/2015  . Ectopic pregnancy   . Eczema   . GERD (gastroesophageal reflux disease)    no meds  . Headache   . Hernia of abdominal wall 01/04/2013  . Kidney stone   . Left breast mass 04/12/2014   Has pea sized nodule at 6 o'clock tender and mobile, will get mammogram and [place] if needed  . Menorrhagia with regular cycle 09/19/2015  . Pelvic pain in female 09/19/2015  . Placenta previa 2012   Current pregnancy   . Urinary tract infection   . Vaginal discharge 07/01/2013    Patient Active Problem List   Diagnosis Date Noted  . Ovarian cyst 01/10/2019  . S/P hysterectomy 10/10/2015  . Dyspareunia in female 09/19/2015  . Dysmenorrhea 09/19/2015  . Left breast mass 04/12/2014  . Hernia of abdominal wall 01/04/2013  . Breast mass, right 10/20/2012    Past Surgical History:  Procedure Laterality Date  . ABDOMINAL HYSTERECTOMY N/A 10/10/2015   Procedure: HYSTERECTOMY ABDOMINAL;  Surgeon: Florian Buff, MD;  Location: [facility];  Service: Gynecology;  Laterality: N/A;  . APPENDECTOMY    . APPENDECTOMY    . BILATERAL SALPINGECTOMY Bilateral 10/10/2015   Procedure: BILATERAL  SALPINGECTOMY;  Surgeon: Florian Buff, MD;  Location: [facility];  Service: Gynecology;  Laterality: Bilateral;  . BREAST BIOPSY Right   . CESAREAN SECTION  04/24/2011   Procedure: CESAREAN SECTION;  Surgeon: Florian Buff, MD;  Location: [facility];  Service: Gynecology;  Laterality: N/A;  Primary/Previa  . CHOLECYSTECTOMY    . COLONOSCOPY    . CYSTOSCOPY W/ URETERAL STENT PLACEMENT Left 08/04/2014   Procedure: CYSTOSCOPY WITH LEFT  RETROGRADE PYELOGRAM/ LEFT URETERAL STENT PLACEMENT;  Surgeon: Festus Aloe, MD;  Location: [facility];  Service: Urology;  Laterality: Left;  . TONSILLECTOMY    . UPPER GI ENDOSCOPY      OB History    Gravida  2   Para  1   Term  0   Preterm  1   AB  1   Living  1     SAB  0   IAB  0   Ectopic  1   Multiple  0   Live Births  1            Home Medications    Prior to Admission medications   Medication Sig Start Date End Date Taking? Authorizing Provider  AIMOVIG 140 MG/ML SOAJ Inject into the skin every 30 (thirty) days. 04/02/20   [provider]  ALPRAZolam (XANAX) 1 MG tablet Take 0.5-1 mg by mouth 3 (three) times daily as needed for anxiety.    [provider]  Ascorbic Acid (VITAMIN C) 1000 MG tablet Take 1,000 mg by mouth daily.    [provider]  buPROPion (WELLBUTRIN XL) 150 MG 24 hr tablet Take 150 mg by mouth every morning. 11/25/19   [provider]  chlorpheniramine-HYDROcodone (TUSSIONEX PENNKINETIC ER) 10-8 MG/5ML SUER Take 5 mLs by mouth every 12 (twelve) hours as needed for cough. 05/24/20   Chase Picket, MD  fluticasone (FLONASE) 50 MCG/ACT nasal spray Place 1 spray into both nostrils daily for 14 days. 01/22/20 02/05/20  Avegno, Darrelyn Hillock, FNP  furosemide (LASIX) 40 MG tablet Take 40 mg by mouth as needed.     [provider]  gabapentin (NEURONTIN) 100 MG capsule Take 1 capsule (100 mg total) by mouth at bedtime. Take 4 capsules at bedtime 05/23/20   Metta Clines R, DO   levocetirizine (XYZAL) 5 MG tablet TAKE ONE TABLET BY MOUTH EVERY EVENING. Patient taking differently: Take 5 mg by mouth every evening.  03/24/17   Valentina Shaggy, MD  omeprazole (PRILOSEC) 20 MG capsule Take 20 mg by mouth daily.  09/22/15   [provider]  oxybutynin (DITROPAN-XL) 5 MG 24 hr tablet Take 5 mg by mouth at bedtime.    [provider]  Potassium 99 MG TABS Take by mouth.    [provider]  progesterone (PROMETRIUM) 200 MG capsule TAKE ONE CAPSULE BY MOUTH ONCE DAILY. 11/25/19   Florian Buff, MD  sertraline (ZOLOFT) 100 MG tablet Take 150 mg by mouth every morning.     [provider]  Vitamin D, Ergocalciferol, (DRISDOL) 1.25 MG (50000 UNIT) CAPS capsule Take by mouth. 01/16/20   [provider]    Family History Family History  Problem Relation Age of Onset  . Depression Mother   . Cancer Mother   . Seizures Mother   . Bipolar disorder Mother   . Asthma Mother   . Atrial fibrillation Mother   . Hypertension Father   . Asthma Father   . Heart disease Maternal Grandmother   . Cancer Maternal Grandmother        oral; lymph nodes  . Hypertension Maternal Grandmother   . Other Maternal Grandmother        memory problems  . Stroke Paternal Grandfather   . Hypertension Paternal Grandfather   . Other Paternal Grandfather        brain tumors  . Heart disease Maternal Grandfather   . Pancreatitis Maternal Grandfather   . Cancer Paternal Grandmother        breast  . Heart disease Paternal Grandmother   . Kidney disease Paternal Grandmother        kidney failure  . Diabetes Paternal Grandmother   . Urticaria Son   . Allergic rhinitis Neg Hx   . Angioedema Neg Hx   . Eczema Neg Hx   . Immunodeficiency Neg Hx     Social History Social History   Tobacco Use  . Smoking status: Former Smoker    Years: 14.00    Types: Cigarettes    Quit date: 09/07/2009    Years since quitting: 10.7  . Smokeless tobacco: Never  Used  Vaping Use  . Vaping Use: Never used  Substance Use Topics  . Alcohol use: Yes    Comment: occ mixed drink  . Drug use: No  Allergies   Clarithromycin and Topamax [topiramate]   Review of Systems Review of Systems  HENT: Negative for ear discharge, ear pain and sore throat.   Respiratory: Positive for cough. Negative for chest tightness, shortness of breath and wheezing.      Physical Exam Triage Vital Signs ED Triage Vitals  Enc Vitals Group     BP 05/24/20 1031 126/84     Pulse Rate 05/24/20 1031 85     Resp 05/24/20 1031 18     Temp 05/24/20 1031 98.1 F (36.7 C)     Temp src --      SpO2 05/24/20 1031 96 %     Weight --      Height --      Head Circumference --      Peak Flow --      Pain Score 05/24/20 1029 4     Pain Loc --      Pain Edu? --      Excl. in [place]? --    No data found.  Updated Vital Signs BP 126/84   Pulse 85   Temp 98.1 F (36.7 C)   Resp 18   LMP 09/26/2015 (Exact Date)   SpO2 96%   Visual Acuity Right Eye Distance:   Left Eye Distance:   Bilateral Distance:    Right Eye Near:   Left Eye Near:    Bilateral Near:     Physical Exam Vitals and nursing note reviewed.  Constitutional:      Appearance: Normal appearance.  HENT:     Right Ear: Tympanic membrane and ear canal normal.     Left Ear: Ear canal normal.     Ears:     Comments: Middle ear effusion in the right ear. Cardiovascular:     Rate and Rhythm: Normal rate and regular rhythm.     Pulses: Normal pulses.     Heart sounds: Normal heart sounds.  Pulmonary:     Effort: Pulmonary effort is normal.     Breath sounds: Normal breath sounds.  Neurological:     Mental Status: She is alert.      UC Treatments / Results  Labs (all labs ordered are listed, but only abnormal results are displayed) Labs Reviewed - No data to display  EKG   Radiology No results found.  Procedures Procedures (including critical care time)  Medications Ordered in  UC Medications - No data to display  Initial Impression / Assessment and Plan / UC Course  I have reviewed the triage vital signs and the nursing notes.  Pertinent labs & imaging results that were available during my care of the patient were reviewed by me and considered in my medical decision making (see chart for details).     1.  Middle ear effusion in the right ear: Increase fluticasone nasal spray to 2 sprays twice daily Tussionex as needed for cough Humidifier use will be helpful Use saline nasal drops more frequently. Final Clinical Impressions(s) / UC Diagnoses   Final diagnoses:  Middle ear effusion, right     Discharge Instructions     Continue using saline nasal spray Take cough medications as prescribed Humidifier use will be helpful If you have worsening symptoms please return to urgent care.    ED Prescriptions    Medication Sig Dispense Auth. Provider   chlorpheniramine-HYDROcodone (TUSSIONEX PENNKINETIC ER) 10-8 MG/5ML SUER Take 5 mLs by mouth every 12 (twelve) hours as needed for cough. 140 mL Chase Picket,  MD     PDMP not reviewed this encounter.   Chase Picket, MD 05/24/20 [phone]

## 2020-05-24 NOTE — ED Triage Notes (Signed)
Pt presents with nasal congestion and sinus pressure, covid and flu negative

## 2020-06-14 ENCOUNTER — Other Ambulatory Visit: Payer: Self-pay

## 2020-06-14 ENCOUNTER — Ambulatory Visit
Admission: RE | Admit: 2020-06-14 | Discharge: 2020-06-14 | Disposition: A | Discharge status: Home / Self Care | Payer: 59 | Source: Ambulatory Visit | Attending: Obstetrics & Gynecology | Admitting: Obstetrics & Gynecology

## 2020-06-14 DIAGNOSIS — Z0000 Encounter for general adult medical examination without abnormal findings: Secondary | ICD-10-CM

## 2020-06-19 ENCOUNTER — Other Ambulatory Visit: Payer: Self-pay

## 2020-06-19 ENCOUNTER — Ambulatory Visit
Admission: RE | Admit: 2020-06-19 | Discharge: 2020-06-19 | Disposition: A | Discharge status: Home / Self Care | Payer: 59 | Source: Ambulatory Visit | Attending: Neurology | Admitting: Neurology

## 2020-06-19 DIAGNOSIS — M542 Cervicalgia: Secondary | ICD-10-CM

## 2020-06-19 DIAGNOSIS — G43709 Chronic migraine without aura, not intractable, without status migrainosus: Secondary | ICD-10-CM

## 2020-06-20 ENCOUNTER — Other Ambulatory Visit: Payer: Self-pay

## 2020-06-20 MED ORDER — NORTRIPTYLINE HCL 25 MG PO CAPS
25.0000 mg | ORAL_CAPSULE | Freq: Every day | ORAL | 3 refills | Status: DC
Start: 2020-06-20 — End: 2020-10-29

## 2020-06-20 NOTE — Progress Notes (Signed)
Per DR.Jaffe Nortriptyline 25 mg sent to the pharmacy

## 2020-09-08 ENCOUNTER — Other Ambulatory Visit: Payer: Self-pay

## 2020-09-08 ENCOUNTER — Ambulatory Visit
Admission: RE | Admit: 2020-09-08 | Discharge: 2020-09-08 | Disposition: A | Discharge status: Home / Self Care | Payer: 59 | Source: Ambulatory Visit | Attending: Emergency Medicine | Admitting: Emergency Medicine

## 2020-09-08 VITALS — BP 121/81 | HR 89 | Temp 98.1°F | Resp 18

## 2020-09-08 DIAGNOSIS — J029 Acute pharyngitis, unspecified: Secondary | ICD-10-CM | POA: Diagnosis not present

## 2020-09-08 LAB — POCT RAPID STREP A (OFFICE): Rapid Strep A Screen: NEGATIVE

## 2020-09-08 MED ORDER — DEXAMETHASONE SODIUM PHOSPHATE 10 MG/ML IJ SOLN
10.0000 mg | Freq: Once | INTRAMUSCULAR | Status: AC
  Administered 2020-09-08: 10 mg via INTRAMUSCULAR

## 2020-09-08 MED ORDER — AMOXICILLIN 500 MG PO CAPS
500.0000 mg | ORAL_CAPSULE | Freq: Two times a day (BID) | ORAL | 0 refills | Status: AC
Start: 2020-09-08 — End: 2020-09-18

## 2020-09-08 NOTE — ED Triage Notes (Signed)
Sore throat x 3 days sinus drainage in the morning and night night.

## 2020-09-08 NOTE — ED Provider Notes (Signed)
[facility]   [id number] 09/08/20 Arrival Time: 1001   CC: Sore throat  SUBJECTIVE: History from: patient.  Julie [name] is a 45 y.o. female who presents with sinus drainage and sore throat x 3 days.  Denies sick exposure to COVID, flu or strep.  Has NOT tried OTC medications.  Symptoms are made worse with swallowing but tolerating own secretions and liquids.  Reports previous symptoms in the past with strep throat.   Denies fever, chills, SOB, wheezing, chest pain, nausea, changes in bowel or bladder habits.    ROS: As per HPI.  All other pertinent ROS negative.     Past Medical History:  Diagnosis Date  . Anemia   . Anemia   . Anxiety   . Breast discharge 07/01/2013   Pt noticed discharge, none on exam today will check TSH and Prolactin  . Breast mass, right 10/20/2012  . Depression   . Dysmenorrhea 09/19/2015  . Dyspareunia in female 09/19/2015  . Ectopic pregnancy   . Eczema   . GERD (gastroesophageal reflux disease)    no meds  . Headache   . Hernia of abdominal wall 01/04/2013  . Kidney stone   . Left breast mass 04/12/2014   Has pea sized nodule at 6 o'clock tender and mobile, will get mammogram and [place] if needed  . Menorrhagia with regular cycle 09/19/2015  . Pelvic pain in female 09/19/2015  . Placenta previa 2012   Current pregnancy   . Urinary tract infection   . Vaginal discharge 07/01/2013   Past Surgical History:  Procedure Laterality Date  . ABDOMINAL HYSTERECTOMY N/A 10/10/2015   Procedure: HYSTERECTOMY ABDOMINAL;  Surgeon: Florian Buff, MD;  Location: [facility];  Service: Gynecology;  Laterality: N/A;  . APPENDECTOMY    . APPENDECTOMY    . BILATERAL SALPINGECTOMY Bilateral 10/10/2015   Procedure: BILATERAL SALPINGECTOMY;  Surgeon: Florian Buff, MD;  Location: [facility];  Service: Gynecology;  Laterality: Bilateral;  . BREAST BIOPSY Right 11/04/2012   hylanized tissue with minimal chronic inflammatory tissue  . CESAREAN SECTION  04/24/2011   Procedure:  CESAREAN SECTION;  Surgeon: Florian Buff, MD;  Location: [facility];  Service: Gynecology;  Laterality: N/A;  Primary/Previa  . CHOLECYSTECTOMY    . COLONOSCOPY    . CYSTOSCOPY W/ URETERAL STENT PLACEMENT Left 08/04/2014   Procedure: CYSTOSCOPY WITH LEFT  RETROGRADE PYELOGRAM/ LEFT URETERAL STENT PLACEMENT;  Surgeon: Festus Aloe, MD;  Location: [facility];  Service: Urology;  Laterality: Left;  . TONSILLECTOMY    . UPPER GI ENDOSCOPY     Allergies  Allergen Reactions  . Clarithromycin Other (See Comments)    Makes throat raw  . Topamax [Topiramate] Itching   No current facility-administered medications on file prior to encounter.   Current Outpatient Medications on File Prior to Encounter  Medication Sig Dispense Refill  . AIMOVIG 140 MG/ML SOAJ Inject into the skin every 30 (thirty) days.    . ALPRAZolam (XANAX) 1 MG tablet Take 0.5-1 mg by mouth 3 (three) times daily as needed for anxiety.    . Ascorbic Acid (VITAMIN C) 1000 MG tablet Take 1,000 mg by mouth daily.    Marland Kitchen buPROPion (WELLBUTRIN XL) 150 MG 24 hr tablet Take 150 mg by mouth every morning.    . chlorpheniramine-HYDROcodone (TUSSIONEX PENNKINETIC ER) 10-8 MG/5ML SUER Take 5 mLs by mouth every 12 (twelve) hours as needed for cough. 140 mL 0  . fluticasone (FLONASE) 50 MCG/ACT nasal spray Place 1 spray into  both nostrils daily for 14 days. 15.8 mL 0  . furosemide (LASIX) 40 MG tablet Take 40 mg by mouth as needed.     . gabapentin (NEURONTIN) 100 MG capsule Take 1 capsule (100 mg total) by mouth at bedtime. Take 4 capsules at bedtime 120 capsule 6  . levocetirizine (XYZAL) 5 MG tablet TAKE ONE TABLET BY MOUTH EVERY EVENING. (Patient taking differently: Take 5 mg by mouth every evening. ) 30 tablet 1  . nortriptyline (PAMELOR) 25 MG capsule Take 1 capsule (25 mg total) by mouth at bedtime. Please call the office in 4 weeks if you need to increase. 30 capsule 3  . omeprazole (PRILOSEC) 20 MG capsule Take 20 mg by mouth daily.   4  .  oxybutynin (DITROPAN-XL) 5 MG 24 hr tablet Take 5 mg by mouth at bedtime.    . Potassium 99 MG TABS Take by mouth.    . progesterone (PROMETRIUM) 200 MG capsule TAKE ONE CAPSULE BY MOUTH ONCE DAILY. 30 capsule 11  . sertraline (ZOLOFT) 100 MG tablet Take 150 mg by mouth every morning.     . Vitamin D, Ergocalciferol, (DRISDOL) 1.25 MG (50000 UNIT) CAPS capsule Take by mouth.     Social History   Socioeconomic History  . Marital status: Married    Spouse name: Not on file  . Number of children: 1  . Years of education: Not on file  . Highest education level: Not on file  Occupational History  . Not on file  Tobacco Use  . Smoking status: Former Smoker    Years: 14.00    Types: Cigarettes    Quit date: 09/07/2009    Years since quitting: 11.0  . Smokeless tobacco: Never Used  Vaping Use  . Vaping Use: Never used  Substance and Sexual Activity  . Alcohol use: Yes    Comment: occ mixed drink  . Drug use: No  . Sexual activity: Yes    Birth control/protection: Surgical    Comment: hyst  Other Topics Concern  . Not on file  Social History Narrative   Right handed   One story home   Drinks caffeine   Social Determinants of Health   Financial Resource Strain: Not on file  Food Insecurity: Not on file  Transportation Needs: Not on file  Physical Activity: Not on file  Stress: Not on file  Social Connections: Not on file  Intimate Partner Violence: Not on file   Family History  Problem Relation Age of Onset  . Depression Mother   . Cancer Mother   . Seizures Mother   . Bipolar disorder Mother   . Asthma Mother   . Atrial fibrillation Mother   . Hypertension Father   . Asthma Father   . Heart disease Maternal Grandmother   . Cancer Maternal Grandmother        oral; lymph nodes  . Hypertension Maternal Grandmother   . Other Maternal Grandmother        memory problems  . Stroke Paternal Grandfather   . Hypertension Paternal Grandfather   . Other Paternal  Grandfather        brain tumors  . Heart disease Maternal Grandfather   . Pancreatitis Maternal Grandfather   . Cancer Paternal Grandmother        breast  . Heart disease Paternal Grandmother   . Kidney disease Paternal Grandmother        kidney failure  . Diabetes Paternal Grandmother   . Breast cancer Paternal  Grandmother   . Urticaria Son   . Allergic rhinitis Neg Hx   . Angioedema Neg Hx   . Eczema Neg Hx   . Immunodeficiency Neg Hx     OBJECTIVE:  Vitals:   09/08/20 1019  BP: 121/81  Pulse: 89  Resp: 18  Temp: 98.1 F (36.7 C)  TempSrc: Oral  SpO2: 98%     General appearance: alert; appears fatigued, but nontoxic; speaking in full sentences and tolerating own secretions HEENT: NCAT; Ears: EACs clear, TMs pearly gray; Eyes: PERRL.  EOM grossly intact. Sinuses: nontender; Nose: nares patent without rhinorrhea, Throat: oropharynx clear, tonsils erythematous and 1-2+ enlarged, uvula midline  Neck: supple without LAD Lungs: unlabored respirations, symmetrical air entry; cough: absent; no respiratory distress; CTAB Heart: regular rate and rhythm.  Radial pulses 2+ symmetrical bilaterally Skin: warm and dry Psychological: alert and cooperative; normal mood and affect  LABS:  Results for orders placed or performed during the hospital encounter of 09/08/20 (from the past 24 hour(s))  POCT rapid strep A     Status: None   Collection Time: 09/08/20 10:33 AM  Result Value Ref Range   Rapid Strep A Screen Negative Negative     ASSESSMENT & PLAN:  1. Sore throat   2. Throat infection     Meds ordered this encounter  Medications  . amoxicillin (AMOXIL) 500 MG capsule    Sig: Take 1 capsule (500 mg total) by mouth 2 (two) times daily for 10 days.    Dispense:  20 capsule    Refill:  0    Order Specific Question:   Supervising Provider    Answer:   Raylene Everts [[id number]]   Rapid strep in office negative.  Culture sent.  They will call you with abnormal  results Drink plenty of water and rest Based on exam and history will go ahead and treat for strep throat Prescribed amoxicillin 500mg  twice daily for 10 days.  Take as directed and to completion.  Perform salt water gargles at home, throat lozenges Follow up with PCP if symptoms persist Return or go to ER if you have any new or worsening symptoms fever, chills, nausea, vomiting, redness, swelling, difficulty breathing, drooling, worsening symptoms despite treatment, etc...  Also requests steroid shot in office  Reviewed expectations re: course of current medical issues. Questions answered. Outlined signs and symptoms indicating need for more acute intervention. Patient verbalized understanding. After Visit Summary given.         Lestine Box, PA-C 09/08/20 1113

## 2020-09-08 NOTE — Discharge Instructions (Signed)
Rapid strep in office negative.  Culture sent.  They will call you with abnormal results Drink plenty of water and rest Based on exam and history will go ahead and treat for strep throat Prescribed amoxicillin 500mg  twice daily for 10 days.  Take as directed and to completion.  Perform salt water gargles at home, throat lozenges Follow up with PCP if symptoms persist Return or go to ER if you have any new or worsening symptoms fever, chills, nausea, vomiting, redness, swelling, difficulty breathing, drooling, worsening symptoms despite treatment, etc..Marland Kitchen

## 2020-09-11 LAB — CULTURE, GROUP A STREP (THRC)

## 2020-09-18 ENCOUNTER — Ambulatory Visit (HOSPITAL_COMMUNITY)
Admission: RE | Admit: 2020-09-18 | Discharge: 2020-09-18 | Disposition: A | Discharge status: Home / Self Care | Payer: 59 | Source: Ambulatory Visit | Attending: Internal Medicine | Admitting: Internal Medicine

## 2020-09-18 ENCOUNTER — Other Ambulatory Visit (HOSPITAL_COMMUNITY): Payer: Self-pay | Admitting: Internal Medicine

## 2020-09-18 ENCOUNTER — Other Ambulatory Visit: Payer: Self-pay

## 2020-09-18 DIAGNOSIS — M25571 Pain in right ankle and joints of right foot: Secondary | ICD-10-CM

## 2020-10-10 ENCOUNTER — Ambulatory Visit
Admission: RE | Admit: 2020-10-10 | Discharge: 2020-10-10 | Disposition: A | Discharge status: Home / Self Care | Payer: 59 | Source: Ambulatory Visit | Attending: Emergency Medicine | Admitting: Emergency Medicine

## 2020-10-10 ENCOUNTER — Other Ambulatory Visit: Payer: Self-pay

## 2020-10-10 VITALS — BP 144/101 | HR 95 | Temp 97.5°F | Resp 19

## 2020-10-10 DIAGNOSIS — R0981 Nasal congestion: Secondary | ICD-10-CM | POA: Diagnosis not present

## 2020-10-10 MED ORDER — AMOXICILLIN-POT CLAVULANATE 875-125 MG PO TABS: 1.0000 | ORAL_TABLET | Freq: Two times a day (BID) | ORAL | 0 refills | Status: AC

## 2020-10-10 MED ORDER — BENZONATATE 100 MG PO CAPS
100.0000 mg | ORAL_CAPSULE | Freq: Three times a day (TID) | ORAL | 0 refills | Status: DC
Start: 2020-10-10 — End: 2020-10-30

## 2020-10-10 NOTE — ED Provider Notes (Signed)
[place]   [id number] 10/10/20 Arrival Time: 1140   CC: COVID symptoms  SUBJECTIVE: History from: patient.  Julie [name] is a 45 y.o. female who presents with sinus congestion, pressure, pain, runny nose, congestion, sore throat, and cough x 5 days  Son and husband with similar symptoms.  Has tried OTC medications without relief.  Symptoms are made worse at night.  Reports previous symptoms in the past.   Denies fever, chills, SOB, wheezing, chest pain, nausea, changes in bowel or bladder habits.    ROS: As per HPI.  All other pertinent ROS negative.     Past Medical History:  Diagnosis Date  . Anemia   . Anemia   . Anxiety   . Breast discharge 07/01/2013   Pt noticed discharge, none on exam today will check TSH and Prolactin  . Breast mass, right 10/20/2012  . Depression   . Dysmenorrhea 09/19/2015  . Dyspareunia in female 09/19/2015  . Ectopic pregnancy   . Eczema   . GERD (gastroesophageal reflux disease)    no meds  . Headache   . Hernia of abdominal wall 01/04/2013  . Kidney stone   . Left breast mass 04/12/2014   Has pea sized nodule at 6 o'clock tender and mobile, will get mammogram and [place] if needed  . Menorrhagia with regular cycle 09/19/2015  . Pelvic pain in female 09/19/2015  . Placenta previa 2012   Current pregnancy   . Urinary tract infection   . Vaginal discharge 07/01/2013   Past Surgical History:  Procedure Laterality Date  . ABDOMINAL HYSTERECTOMY N/A 10/10/2015   Procedure: HYSTERECTOMY ABDOMINAL;  Surgeon: Florian Buff, MD;  Location: [facility];  Service: Gynecology;  Laterality: N/A;  . APPENDECTOMY    . APPENDECTOMY    . BILATERAL SALPINGECTOMY Bilateral 10/10/2015   Procedure: BILATERAL SALPINGECTOMY;  Surgeon: Florian Buff, MD;  Location: [facility];  Service: Gynecology;  Laterality: Bilateral;  . BREAST BIOPSY Right 11/04/2012   hylanized tissue with minimal chronic inflammatory tissue  . CESAREAN SECTION  04/24/2011   Procedure: CESAREAN  SECTION;  Surgeon: Florian Buff, MD;  Location: [facility];  Service: Gynecology;  Laterality: N/A;  Primary/Previa  . CHOLECYSTECTOMY    . COLONOSCOPY    . CYSTOSCOPY W/ URETERAL STENT PLACEMENT Left 08/04/2014   Procedure: CYSTOSCOPY WITH LEFT  RETROGRADE PYELOGRAM/ LEFT URETERAL STENT PLACEMENT;  Surgeon: Festus Aloe, MD;  Location: [facility];  Service: Urology;  Laterality: Left;  . TONSILLECTOMY    . UPPER GI ENDOSCOPY     Allergies  Allergen Reactions  . Clarithromycin Other (See Comments)    Makes throat raw  . Topamax [Topiramate] Itching   No current facility-administered medications on file prior to encounter.   Current Outpatient Medications on File Prior to Encounter  Medication Sig Dispense Refill  . AIMOVIG 140 MG/ML SOAJ Inject into the skin every 30 (thirty) days.    . ALPRAZolam (XANAX) 1 MG tablet Take 0.5-1 mg by mouth 3 (three) times daily as needed for anxiety.    . Ascorbic Acid (VITAMIN C) 1000 MG tablet Take 1,000 mg by mouth daily.    Marland Kitchen buPROPion (WELLBUTRIN XL) 150 MG 24 hr tablet Take 150 mg by mouth every morning.    . chlorpheniramine-HYDROcodone (TUSSIONEX PENNKINETIC ER) 10-8 MG/5ML SUER Take 5 mLs by mouth every 12 (twelve) hours as needed for cough. 140 mL 0  . fluticasone (FLONASE) 50 MCG/ACT nasal spray Place 1 spray into both nostrils daily for  14 days. 15.8 mL 0  . furosemide (LASIX) 40 MG tablet Take 40 mg by mouth as needed.     . gabapentin (NEURONTIN) 100 MG capsule Take 1 capsule (100 mg total) by mouth at bedtime. Take 4 capsules at bedtime 120 capsule 6  . levocetirizine (XYZAL) 5 MG tablet TAKE ONE TABLET BY MOUTH EVERY EVENING. (Patient taking differently: Take 5 mg by mouth every evening. ) 30 tablet 1  . nortriptyline (PAMELOR) 25 MG capsule Take 1 capsule (25 mg total) by mouth at bedtime. Please call the office in 4 weeks if you need to increase. 30 capsule 3  . omeprazole (PRILOSEC) 20 MG capsule Take 20 mg by mouth daily.   4  .  oxybutynin (DITROPAN-XL) 5 MG 24 hr tablet Take 5 mg by mouth at bedtime.    . Potassium 99 MG TABS Take by mouth.    . progesterone (PROMETRIUM) 200 MG capsule TAKE ONE CAPSULE BY MOUTH ONCE DAILY. 30 capsule 11  . sertraline (ZOLOFT) 100 MG tablet Take 150 mg by mouth every morning.     . Vitamin D, Ergocalciferol, (DRISDOL) 1.25 MG (50000 UNIT) CAPS capsule Take by mouth.     Social History   Socioeconomic History  . Marital status: Married    Spouse name: Not on file  . Number of children: 1  . Years of education: Not on file  . Highest education level: Not on file  Occupational History  . Not on file  Tobacco Use  . Smoking status: Former Smoker    Years: 14.00    Types: Cigarettes    Quit date: 09/07/2009    Years since quitting: 11.0  . Smokeless tobacco: Never Used  Vaping Use  . Vaping Use: Never used  Substance and Sexual Activity  . Alcohol use: Yes    Comment: occ mixed drink  . Drug use: No  . Sexual activity: Yes    Birth control/protection: Surgical    Comment: hyst  Other Topics Concern  . Not on file  Social History Narrative   Right handed   One story home   Drinks caffeine   Social Determinants of Health   Financial Resource Strain: Not on file  Food Insecurity: Not on file  Transportation Needs: Not on file  Physical Activity: Not on file  Stress: Not on file  Social Connections: Not on file  Intimate Partner Violence: Not on file   Family History  Problem Relation Age of Onset  . Depression Mother   . Cancer Mother   . Seizures Mother   . Bipolar disorder Mother   . Asthma Mother   . Atrial fibrillation Mother   . Hypertension Father   . Asthma Father   . Heart disease Maternal Grandmother   . Cancer Maternal Grandmother        oral; lymph nodes  . Hypertension Maternal Grandmother   . Other Maternal Grandmother        memory problems  . Stroke Paternal Grandfather   . Hypertension Paternal Grandfather   . Other Paternal  Grandfather        brain tumors  . Heart disease Maternal Grandfather   . Pancreatitis Maternal Grandfather   . Cancer Paternal Grandmother        breast  . Heart disease Paternal Grandmother   . Kidney disease Paternal Grandmother        kidney failure  . Diabetes Paternal Grandmother   . Breast cancer Paternal Grandmother   .  Urticaria Son   . Allergic rhinitis Neg Hx   . Angioedema Neg Hx   . Eczema Neg Hx   . Immunodeficiency Neg Hx     OBJECTIVE:  Vitals:   10/10/20 1150  BP: (!) 144/101  Pulse: 95  Resp: 19  Temp: (!) 97.5 F (36.4 C)  TempSrc: Temporal  SpO2: 97%     General appearance: alert; appears fatigued, but nontoxic; speaking in full sentences and tolerating own secretions HEENT: NCAT; Ears: EACs clear, TMs pearly gray; Eyes: PERRL.  EOM grossly intact. Sinuses: TTP; Nose: nares patent without rhinorrhea, Throat: oropharynx clear, tonsils non erythematous or enlarged, uvula midline  Neck: supple without LAD Lungs: unlabored respirations, symmetrical air entry; cough: moderate; no respiratory distress; CTAB Heart: regular rate and rhythm.   Skin: warm and dry Psychological: alert and cooperative; normal mood and affect  ASSESSMENT & PLAN:  1. Sinus congestion     Meds ordered this encounter  Medications  . amoxicillin-clavulanate (AUGMENTIN) 875-125 MG tablet    Sig: Take 1 tablet by mouth every 12 (twelve) hours for 10 days.    Dispense:  20 tablet    Refill:  0    Order Specific Question:   Supervising Provider    Answer:   Raylene Everts [[id number]]  . benzonatate (TESSALON) 100 MG capsule    Sig: Take 1 capsule (100 mg total) by mouth every 8 (eight) hours.    Dispense:  21 capsule    Refill:  0    Order Specific Question:   Supervising Provider    Answer:   Raylene Everts [[id number]]   Get plenty of rest and push fluids Tessalon Perles prescribed for cough Augmentin prescribed.  Take as directed and to completion Use OTC zyrtec for  nasal congestion, runny nose, and/or sore throat Use OTC flonase for nasal congestion and runny nose Use medications daily for symptom relief Use OTC medications like ibuprofen or tylenol as needed fever or pain Call or go to the ED if you have any new or worsening symptoms such as fever, worsening cough, shortness of breath, chest tightness, chest pain, turning blue, changes in mental status, etc...   Reviewed expectations re: course of current medical issues. Questions answered. Outlined signs and symptoms indicating need for more acute intervention. Patient verbalized understanding. After Visit Summary given.         Lestine Box, PA-C 10/10/20 1157

## 2020-10-10 NOTE — ED Triage Notes (Signed)
Cough and congestion x5 days.

## 2020-10-10 NOTE — Discharge Instructions (Signed)
Get plenty of rest and push fluids Tessalon Perles prescribed for cough Augmentin prescribed.  Take as directed and to completion Use OTC zyrtec for nasal congestion, runny nose, and/or sore throat Use OTC flonase for nasal congestion and runny nose Use medications daily for symptom relief Use OTC medications like ibuprofen or tylenol as needed fever or pain Call or go to the ED if you have any new or worsening symptoms such as fever, worsening cough, shortness of breath, chest tightness, chest pain, turning blue, changes in mental status, etc..Marland Kitchen

## 2020-10-23 ENCOUNTER — Other Ambulatory Visit: Payer: Self-pay | Admitting: Neurology

## 2020-10-25 ENCOUNTER — Ambulatory Visit: Payer: BLUE CROSS/BLUE SHIELD | Admitting: Podiatry

## 2020-10-29 ENCOUNTER — Telehealth: Payer: Self-pay

## 2020-10-29 MED ORDER — NORTRIPTYLINE HCL 25 MG PO CAPS
25.0000 mg | ORAL_CAPSULE | Freq: Every day | ORAL | 5 refills | Status: DC
  Filled 2021-04-01: qty 30, 30d supply, fill #0

## 2020-10-29 NOTE — Telephone Encounter (Signed)
Per pt mychart message, My whole family has had covid and we Are in the 5 days masked. I am completly out of nortriptyline can you put one refill until I can come in for a visit please and thank you Refilled until her appt 10/22 per dr.Jaffe

## 2020-10-29 NOTE — Telephone Encounter (Signed)
OK to refill until follow up. 

## 2020-10-30 ENCOUNTER — Other Ambulatory Visit: Payer: Self-pay | Admitting: Podiatry

## 2020-10-30 ENCOUNTER — Ambulatory Visit (INDEPENDENT_AMBULATORY_CARE_PROVIDER_SITE_OTHER): Payer: 59 | Admitting: Podiatry

## 2020-10-30 ENCOUNTER — Other Ambulatory Visit: Payer: Self-pay

## 2020-10-30 ENCOUNTER — Encounter: Payer: Self-pay | Admitting: Podiatry

## 2020-10-30 ENCOUNTER — Ambulatory Visit (INDEPENDENT_AMBULATORY_CARE_PROVIDER_SITE_OTHER): Payer: 59

## 2020-10-30 DIAGNOSIS — M7751 Other enthesopathy of right foot: Secondary | ICD-10-CM

## 2020-10-30 DIAGNOSIS — Q6689 Other  specified congenital deformities of feet: Secondary | ICD-10-CM | POA: Diagnosis not present

## 2020-10-30 DIAGNOSIS — M7752 Other enthesopathy of left foot: Secondary | ICD-10-CM

## 2020-10-30 MED ORDER — TRIAMCINOLONE ACETONIDE 40 MG/ML IJ SUSP
20.0000 mg | Freq: Once | INTRAMUSCULAR | Status: AC
  Administered 2020-10-30: 20 mg

## 2020-10-30 NOTE — Progress Notes (Signed)
Subjective:  Patient ID: Julie [name], female    DOB: August 29, 1975,  MRN: [id number] HPI Chief Complaint  Patient presents with  . Ankle Pain    Lateral ankle right - aching, swelling x 2-3 months, fractured as a teenager, PCP Rx'd prednisone pack-helped temp, saw Ortho-xrayed, said had spurs, Rx'd meloxicam and referred here, limited ROM   . New Patient (Initial Visit)    45 y.o. female presents with the above complaint.   ROS: Denies fever chills nausea vomiting muscle aches pains calf pain back pain chest pain shortness of breath.  Past Medical History:  Diagnosis Date  . Anemia   . Anemia   . Anxiety   . Breast discharge 07/01/2013   Pt noticed discharge, none on exam today will check TSH and Prolactin  . Breast mass, right 10/20/2012  . Depression   . Dysmenorrhea 09/19/2015  . Dyspareunia in female 09/19/2015  . Ectopic pregnancy   . Eczema   . GERD (gastroesophageal reflux disease)    no meds  . Headache   . Hernia of abdominal wall 01/04/2013  . Kidney stone   . Left breast mass 04/12/2014   Has pea sized nodule at 6 o'clock tender and mobile, will get mammogram and [place] if needed  . Menorrhagia with regular cycle 09/19/2015  . Pelvic pain in female 09/19/2015  . Placenta previa 2012   Current pregnancy   . Urinary tract infection   . Vaginal discharge 07/01/2013   Past Surgical History:  Procedure Laterality Date  . ABDOMINAL HYSTERECTOMY N/A 10/10/2015   Procedure: HYSTERECTOMY ABDOMINAL;  Surgeon: Florian Buff, MD;  Location: [facility];  Service: Gynecology;  Laterality: N/A;  . APPENDECTOMY    . APPENDECTOMY    . BILATERAL SALPINGECTOMY Bilateral 10/10/2015   Procedure: BILATERAL SALPINGECTOMY;  Surgeon: Florian Buff, MD;  Location: [facility];  Service: Gynecology;  Laterality: Bilateral;  . BREAST BIOPSY Right 11/04/2012   hylanized tissue with minimal chronic inflammatory tissue  . CESAREAN SECTION  04/24/2011   Procedure: CESAREAN SECTION;  Surgeon: Florian Buff,  MD;  Location: [facility];  Service: Gynecology;  Laterality: N/A;  Primary/Previa  . CHOLECYSTECTOMY    . COLONOSCOPY    . CYSTOSCOPY W/ URETERAL STENT PLACEMENT Left 08/04/2014   Procedure: CYSTOSCOPY WITH LEFT  RETROGRADE PYELOGRAM/ LEFT URETERAL STENT PLACEMENT;  Surgeon: Festus Aloe, MD;  Location: [facility];  Service: Urology;  Laterality: Left;  . TONSILLECTOMY    . UPPER GI ENDOSCOPY      Current Outpatient Medications:  .  AIMOVIG 140 MG/ML SOAJ, Inject into the skin every 30 (thirty) days., Disp: , Rfl:  .  ALPRAZolam (XANAX) 1 MG tablet, Take 0.5-1 mg by mouth 3 (three) times daily as needed for anxiety., Disp: , Rfl:  .  Ascorbic Acid (VITAMIN C) 1000 MG tablet, Take 1,000 mg by mouth daily., Disp: , Rfl:  .  buPROPion (WELLBUTRIN XL) 150 MG 24 hr tablet, Take 150 mg by mouth every morning., Disp: , Rfl:  .  furosemide (LASIX) 40 MG tablet, Take 40 mg by mouth as needed. , Disp: , Rfl:  .  levocetirizine (XYZAL) 5 MG tablet, TAKE ONE TABLET BY MOUTH EVERY EVENING. (Patient taking differently: Take 5 mg by mouth every evening. ), Disp: 30 tablet, Rfl: 1 .  nortriptyline (PAMELOR) 25 MG capsule, Take 1 capsule (25 mg total) by mouth at bedtime. Please call the office in 4 weeks if you need to increase., Disp: 30 capsule, Rfl: 5 .  omeprazole (PRILOSEC) 20 MG capsule, Take 20 mg by mouth daily. , Disp: , Rfl: 4 .  oxybutynin (DITROPAN-XL) 5 MG 24 hr tablet, Take 5 mg by mouth at bedtime., Disp: , Rfl:  .  Potassium 99 MG TABS, Take by mouth., Disp: , Rfl:  .  sertraline (ZOLOFT) 100 MG tablet, Take 150 mg by mouth every morning. , Disp: , Rfl:  .  Vitamin D, Ergocalciferol, (DRISDOL) 1.25 MG (50000 UNIT) CAPS capsule, Take by mouth., Disp: , Rfl:   Allergies  Allergen Reactions  . Clarithromycin Other (See Comments)    Makes throat raw  . Topamax [Topiramate] Itching   Review of Systems Objective:  There were no vitals filed for this visit.  General: Well developed, nourished,  in no acute distress, alert and oriented x3   Dermatological: Skin is warm, dry and supple bilateral. Nails x 10 are well maintained; remaining integument appears unremarkable at this time. There are no open sores, no preulcerative lesions, no rash or signs of infection present.  Vascular: Dorsalis Pedis artery and Posterior Tibial artery pedal pulses are 2/4 bilateral with immedate capillary fill time. Pedal hair growth present. No varicosities and no lower extremity edema present bilateral.   Neruologic: Grossly intact via light touch bilateral. Vibratory intact via tuning fork bilateral. Protective threshold with Semmes Wienstein monofilament intact to all pedal sites bilateral. Patellar and Achilles deep tendon reflexes 2+ bilateral. No Babinski or clonus noted bilateral.   Musculoskeletal: No gross boney pedal deformities bilateral. No pain, crepitus, or limitation noted with foot and ankle range of motion bilateral. Muscular strength 5/5 in all groups tested bilateral.  She has pain on palpation of the sinus tarsi and attempted inversion of the subtalar joint.  Full range of motion of the ankle joint without crepitus.  Gait: Unassisted, Nonantalgic.    Radiographs:  Radiographs reviewed today demonstrate osseously mature individual with a small spicula spur off the medial malleolus not intruding into the joint.  Lateral malleolus demonstrates some spurring distally but mortise appears to be intact and no signs of osteoarthritic change.  She does demonstrate some osteoarthritic change of the subtalar joint on lateral view with a concentric sclerotic ring consisting of the ankle joint subtalar joint and midtarsal this is usually indicative of a coalition.  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  Plan: Discussed appropriate shoe gear stretching  exercises ice therapy and shoe gear modifications I injected the subtalar joint today Kenalog and local anesthetic.  Tolerated procedure well after sterile Betadine skin prep.     Jimmy Stipes T. [place], [place]

## 2020-11-01 ENCOUNTER — Ambulatory Visit: Payer: 59 | Admitting: Podiatry

## 2020-12-04 ENCOUNTER — Ambulatory Visit: Payer: 59 | Admitting: Podiatry

## 2021-01-04 ENCOUNTER — Other Ambulatory Visit: Payer: Self-pay

## 2021-01-04 ENCOUNTER — Ambulatory Visit
Admission: EM | Admit: 2021-01-04 | Discharge: 2021-01-04 | Disposition: A | Discharge status: Home / Self Care | Payer: 59 | Attending: Internal Medicine | Admitting: Internal Medicine

## 2021-01-04 ENCOUNTER — Encounter: Payer: Self-pay | Admitting: *Deleted

## 2021-01-04 DIAGNOSIS — J029 Acute pharyngitis, unspecified: Secondary | ICD-10-CM | POA: Diagnosis not present

## 2021-01-04 DIAGNOSIS — F32A Depression, unspecified: Secondary | ICD-10-CM | POA: Insufficient documentation

## 2021-01-04 DIAGNOSIS — F419 Anxiety disorder, unspecified: Secondary | ICD-10-CM | POA: Insufficient documentation

## 2021-01-04 LAB — POCT RAPID STREP A (OFFICE): Rapid Strep A Screen: NEGATIVE

## 2021-01-04 MED ORDER — CETIRIZINE-PSEUDOEPHEDRINE ER 5-120 MG PO TB12: 1.0000 | ORAL_TABLET | Freq: Every day | ORAL | 0 refills | Status: DC

## 2021-01-04 NOTE — ED Provider Notes (Signed)
[place]   [id number] 01/04/21 Arrival Time: [id number]   CC: sore throat  SUBJECTIVE: History from: patient.  Julie [name] is a 45 y.o. female who presents with sinus drainage, headache, and sore throat x 1 day.  Son with similar symptoms.  Son tested negative for covid.  Has tried OTC medications without relief.  Symptoms are made worse with swallowing.  Reports previous symptoms in the past with strep.   Denies fever, chills, SOB, wheezing, chest pain, nausea, changes in bowel or bladder habits.    ROS: As per HPI.  All other pertinent ROS negative.     Past Medical History:  Diagnosis Date   Anemia    Anemia    Anxiety    Breast discharge 07/01/2013   Pt noticed discharge, none on exam today will check TSH and Prolactin   Breast mass, right 10/20/2012   Depression    Dysmenorrhea 09/19/2015   Dyspareunia in female 09/19/2015   Ectopic pregnancy    Eczema    GERD (gastroesophageal reflux disease)    no meds   Headache    Hernia of abdominal wall 01/04/2013   Kidney stone    Left breast mass 04/12/2014   Has pea sized nodule at 6 o'clock tender and mobile, will get mammogram and [place] if needed   Menorrhagia with regular cycle 09/19/2015   Pelvic pain in female 09/19/2015   Placenta previa 2012   Current pregnancy    Urinary tract infection    Vaginal discharge 07/01/2013   Past Surgical History:  Procedure Laterality Date   ABDOMINAL HYSTERECTOMY N/A 10/10/2015   Procedure: HYSTERECTOMY ABDOMINAL;  Surgeon: Florian Buff, MD;  Location: [facility];  Service: Gynecology;  Laterality: N/A;   APPENDECTOMY     APPENDECTOMY     BILATERAL SALPINGECTOMY Bilateral 10/10/2015   Procedure: BILATERAL SALPINGECTOMY;  Surgeon: Florian Buff, MD;  Location: [facility];  Service: Gynecology;  Laterality: Bilateral;   BREAST BIOPSY Right 11/04/2012   hylanized tissue with minimal chronic inflammatory tissue   CESAREAN SECTION  04/24/2011   Procedure: CESAREAN SECTION;  Surgeon: Florian Buff, MD;  Location: [facility];  Service: Gynecology;  Laterality: N/A;  Primary/Previa   CHOLECYSTECTOMY     COLONOSCOPY     CYSTOSCOPY W/ URETERAL STENT PLACEMENT Left 08/04/2014   Procedure: CYSTOSCOPY WITH LEFT  RETROGRADE PYELOGRAM/ LEFT URETERAL STENT PLACEMENT;  Surgeon: Festus Aloe, MD;  Location: [facility];  Service: Urology;  Laterality: Left;   TONSILLECTOMY     UPPER GI ENDOSCOPY     Allergies  Allergen Reactions   Clarithromycin Other (See Comments)    Makes throat raw   Topamax [Topiramate] Itching   No current facility-administered medications on file prior to encounter.   Current Outpatient Medications on File Prior to Encounter  Medication Sig Dispense Refill   AIMOVIG 140 MG/ML SOAJ Inject into the skin every 30 (thirty) days.     ALPRAZolam (XANAX) 1 MG tablet Take 0.5-1 mg by mouth 3 (three) times daily as needed for anxiety.     Ascorbic Acid (VITAMIN C) 1000 MG tablet Take 1,000 mg by mouth daily.     buPROPion (WELLBUTRIN XL) 150 MG 24 hr tablet Take 150 mg by mouth every morning.     furosemide (LASIX) 40 MG tablet Take 40 mg by mouth as needed.      nortriptyline (PAMELOR) 25 MG capsule Take 1 capsule (25 mg total) by mouth at bedtime. Please call the office in 4  weeks if you need to increase. 30 capsule 5   omeprazole (PRILOSEC) 20 MG capsule Take 20 mg by mouth daily.   4   oxybutynin (DITROPAN-XL) 5 MG 24 hr tablet Take 5 mg by mouth at bedtime.     Potassium 99 MG TABS Take by mouth.     sertraline (ZOLOFT) 100 MG tablet Take 150 mg by mouth every morning.      Vitamin D, Ergocalciferol, (DRISDOL) 1.25 MG (50000 UNIT) CAPS capsule Take by mouth.     Social History   Socioeconomic History   Marital status: Married    Spouse name: Not on file   Number of children: 1   Years of education: Not on file   Highest education level: Not on file  Occupational History   Not on file  Tobacco Use   Smoking status: Former    Years: 14.00    Types: Cigarettes     Quit date: 09/07/2009    Years since quitting: 11.3   Smokeless tobacco: Never  Vaping Use   Vaping Use: Never used  Substance and Sexual Activity   Alcohol use: Yes    Comment: occ mixed drink   Drug use: No   Sexual activity: Yes    Birth control/protection: Surgical    Comment: hyst  Other Topics Concern   Not on file  Social History Narrative   Right handed   One story home   Drinks caffeine   Social Determinants of Health   Financial Resource Strain: Not on file  Food Insecurity: Not on file  Transportation Needs: Not on file  Physical Activity: Not on file  Stress: Not on file  Social Connections: Not on file  Intimate Partner Violence: Not on file   Family History  Problem Relation Age of Onset   Depression Mother    Cancer Mother    Seizures Mother    Bipolar disorder Mother    Asthma Mother    Atrial fibrillation Mother    Hypertension Father    Asthma Father    Heart disease Maternal Grandmother    Cancer Maternal Grandmother        oral; lymph nodes   Hypertension Maternal Grandmother    Other Maternal Grandmother        memory problems   Stroke Paternal Grandfather    Hypertension Paternal Grandfather    Other Paternal Grandfather        brain tumors   Heart disease Maternal Grandfather    Pancreatitis Maternal Grandfather    Cancer Paternal Grandmother        breast   Heart disease Paternal Grandmother    Kidney disease Paternal Grandmother        kidney failure   Diabetes Paternal Grandmother    Breast cancer Paternal Grandmother    Urticaria Son    Allergic rhinitis Neg Hx    Angioedema Neg Hx    Eczema Neg Hx    Immunodeficiency Neg Hx     OBJECTIVE:  Vitals:   01/04/21 0859 01/04/21 0921  BP: 125/87   Pulse: 77   Resp: 18   Temp: 98.2 F (36.8 C)   SpO2:  95%     General appearance: alert; appears fatigued, but nontoxic; speaking in full sentences and tolerating own secretions HEENT: NCAT; Ears: EACs clear, TMs pearly gray;  Eyes: PERRL.  EOM grossly intact. Nose: nares patent without rhinorrhea, Throat: oropharynx clear, tonsils absent, uvula midline  Neck: supple without LAD Lungs: unlabored respirations, symmetrical  air entry; cough: absent; no respiratory distress; CTAB Heart: regular rate and rhythm.  Skin: warm and dry Psychological: alert and cooperative; normal mood and affect  LABS:  Results for orders placed or performed during the hospital encounter of 01/04/21 (from the past 24 hour(s))  POCT rapid strep A     Status: None   Collection Time: 01/04/21  9:18 AM  Result Value Ref Range   Rapid Strep A Screen Negative Negative     ASSESSMENT & PLAN:  1. Sore throat     Meds ordered this encounter  Medications   DISCONTD: cetirizine-pseudoephedrine (ZYRTEC-D) 5-120 MG tablet    Sig: Take 1 tablet by mouth daily.    Dispense:  30 tablet    Refill:  0    Order Specific Question:   Supervising Provider    Answer:   Lawson Radar   cetirizine-pseudoephedrine (ZYRTEC-D) 5-120 MG tablet    Sig: Take 1 tablet by mouth daily.    Dispense:  30 tablet    Refill:  0    Order Specific Question:   Supervising Provider    Answer:   Raylene Everts [id number]   Strep negative.  Culture sent.   Declines covid Get plenty of rest and push fluids Zyrtec D for congestion, drainage, and sore throat Use medications daily for symptom relief Use OTC medications like ibuprofen or tylenol as needed fever or pain Call or go to the ED if you have any new or worsening symptoms such as fever, cough, shortness of breath, chest tightness, chest pain, turning blue, changes in mental status, etc...   Reviewed expectations re: course of current medical issues. Questions answered. Outlined signs and symptoms indicating need for more acute intervention. Patient verbalized understanding. After Visit Summary given.          Stacey Drain [place], PA-C 01/04/21 [phone]

## 2021-01-04 NOTE — ED Triage Notes (Signed)
Pt reports sinus drainage ,Ha and sore throat. Pt feels like she has strep.

## 2021-01-04 NOTE — Discharge Instructions (Addendum)
Strep negative.  Culture sent.   Declines covid Get plenty of rest and push fluids Zyrtec D for congestion, drainage, and sore throat Use medications daily for symptom relief Use OTC medications like ibuprofen or tylenol as needed fever or pain Call or go to the ED if you have any new or worsening symptoms such as fever, cough, shortness of breath, chest tightness, chest pain, turning blue, changes in mental status, etc..Marland Kitchen

## 2021-01-07 LAB — CULTURE, GROUP A STREP (THRC)

## 2021-01-24 ENCOUNTER — Encounter: Payer: Self-pay | Admitting: Emergency Medicine

## 2021-01-24 ENCOUNTER — Ambulatory Visit
Admission: EM | Admit: 2021-01-24 | Discharge: 2021-01-24 | Disposition: A | Discharge status: Home / Self Care | Payer: 59 | Attending: Emergency Medicine | Admitting: Emergency Medicine

## 2021-01-24 DIAGNOSIS — R059 Cough, unspecified: Secondary | ICD-10-CM

## 2021-01-24 DIAGNOSIS — R0981 Nasal congestion: Secondary | ICD-10-CM

## 2021-01-24 MED ORDER — HYDROCODONE BIT-HOMATROP MBR 5-1.5 MG/5ML PO SOLN: 5.0000 mL | Freq: Two times a day (BID) | ORAL | 0 refills | Status: DC | PRN

## 2021-01-24 MED ORDER — AMOXICILLIN-POT CLAVULANATE 875-125 MG PO TABS: 1.0000 | ORAL_TABLET | Freq: Two times a day (BID) | ORAL | 0 refills | Status: AC

## 2021-01-24 NOTE — Discharge Instructions (Addendum)
Get plenty of rest and push fluids Augmentin prescribed.  Take as directed and to completion Hycodan prescribed.  This may depress your respiratory rate, this is not recommended for cough, but should help you get rest.   Use OTC zyrtec for nasal congestion, runny nose, and/or sore throat Use OTC flonase for nasal congestion and runny nose Use medications daily for symptom relief Use OTC medications like ibuprofen or tylenol as needed fever or pain Follow up with PCP for reevaluation next week Call or go to the ED if you have any new or worsening symptoms such as fever, worsening cough, shortness of breath, chest tightness, chest pain, turning blue, changes in mental status, etc..Marland Kitchen

## 2021-01-24 NOTE — ED Provider Notes (Signed)
[facility]   [id number] 01/24/21 Arrival Time: [id number]   CC: cough   SUBJECTIVE: History from: patient.  Julie [name] is a 45 y.o. female who presents with sinus congestion, pressure and productive cough x  weeks.  Has tried OTC medication without relief.  Symptoms are made worse with laying down.  Reports previous symptoms in the past.   Denies fever, SOB, wheezing, chest pain, nausea, changes in bowel or bladder habits.    ROS: As per HPI.  All other pertinent ROS negative.     Past Medical History:  Diagnosis Date   Anemia    Anemia    Anxiety    Breast discharge 07/01/2013   Pt noticed discharge, none on exam today will check TSH and Prolactin   Breast mass, right 10/20/2012   Depression    Dysmenorrhea 09/19/2015   Dyspareunia in female 09/19/2015   Ectopic pregnancy    Eczema    GERD (gastroesophageal reflux disease)    no meds   Headache    Hernia of abdominal wall 01/04/2013   Kidney stone    Left breast mass 04/12/2014   Has pea sized nodule at 6 o'clock tender and mobile, will get mammogram and [place] if needed   Menorrhagia with regular cycle 09/19/2015   Pelvic pain in female 09/19/2015   Placenta previa 2012   Current pregnancy    Urinary tract infection    Vaginal discharge 07/01/2013   Past Surgical History:  Procedure Laterality Date   ABDOMINAL HYSTERECTOMY N/A 10/10/2015   Procedure: HYSTERECTOMY ABDOMINAL;  Surgeon: Florian Buff, MD;  Location: [facility];  Service: Gynecology;  Laterality: N/A;   APPENDECTOMY     APPENDECTOMY     BILATERAL SALPINGECTOMY Bilateral 10/10/2015   Procedure: BILATERAL SALPINGECTOMY;  Surgeon: Florian Buff, MD;  Location: [facility];  Service: Gynecology;  Laterality: Bilateral;   BREAST BIOPSY Right 11/04/2012   hylanized tissue with minimal chronic inflammatory tissue   CESAREAN SECTION  04/24/2011   Procedure: CESAREAN SECTION;  Surgeon: Florian Buff, MD;  Location: [facility];  Service: Gynecology;  Laterality: N/A;   Primary/Previa   CHOLECYSTECTOMY     COLONOSCOPY     CYSTOSCOPY W/ URETERAL STENT PLACEMENT Left 08/04/2014   Procedure: CYSTOSCOPY WITH LEFT  RETROGRADE PYELOGRAM/ LEFT URETERAL STENT PLACEMENT;  Surgeon: Festus Aloe, MD;  Location: [facility];  Service: Urology;  Laterality: Left;   TONSILLECTOMY     UPPER GI ENDOSCOPY     Allergies  Allergen Reactions   Clarithromycin Other (See Comments)    Makes throat raw   Topamax [Topiramate] Itching   No current facility-administered medications on file prior to encounter.   Current Outpatient Medications on File Prior to Encounter  Medication Sig Dispense Refill   AIMOVIG 140 MG/ML SOAJ Inject into the skin every 30 (thirty) days.     ALPRAZolam (XANAX) 1 MG tablet Take 0.5-1 mg by mouth 3 (three) times daily as needed for anxiety.     Ascorbic Acid (VITAMIN C) 1000 MG tablet Take 1,000 mg by mouth daily.     buPROPion (WELLBUTRIN XL) 150 MG 24 hr tablet Take 150 mg by mouth every morning.     cetirizine-pseudoephedrine (ZYRTEC-D) 5-120 MG tablet Take 1 tablet by mouth daily. 30 tablet 0   furosemide (LASIX) 40 MG tablet Take 40 mg by mouth as needed.      nortriptyline (PAMELOR) 25 MG capsule Take 1 capsule (25 mg total) by mouth at bedtime. Please call the  office in 4 weeks if you need to increase. 30 capsule 5   omeprazole (PRILOSEC) 20 MG capsule Take 20 mg by mouth daily.   4   oxybutynin (DITROPAN-XL) 5 MG 24 hr tablet Take 5 mg by mouth at bedtime.     Potassium 99 MG TABS Take by mouth.     sertraline (ZOLOFT) 100 MG tablet Take 150 mg by mouth every morning.      Vitamin D, Ergocalciferol, (DRISDOL) 1.25 MG (50000 UNIT) CAPS capsule Take by mouth.     Social History   Socioeconomic History   Marital status: Married    Spouse name: Not on file   Number of children: 1   Years of education: Not on file   Highest education level: Not on file  Occupational History   Not on file  Tobacco Use   Smoking status: Former    Years:  14.00    Types: Cigarettes    Quit date: 09/07/2009    Years since quitting: 11.3   Smokeless tobacco: Never  Vaping Use   Vaping Use: Never used  Substance and Sexual Activity   Alcohol use: Yes    Comment: occ mixed drink   Drug use: No   Sexual activity: Yes    Birth control/protection: Surgical    Comment: hyst  Other Topics Concern   Not on file  Social History Narrative   Right handed   One story home   Drinks caffeine   Social Determinants of Health   Financial Resource Strain: Not on file  Food Insecurity: Not on file  Transportation Needs: Not on file  Physical Activity: Not on file  Stress: Not on file  Social Connections: Not on file  Intimate Partner Violence: Not on file   Family History  Problem Relation Age of Onset   Depression Mother    Cancer Mother    Seizures Mother    Bipolar disorder Mother    Asthma Mother    Atrial fibrillation Mother    Hypertension Father    Asthma Father    Heart disease Maternal Grandmother    Cancer Maternal Grandmother        oral; lymph nodes   Hypertension Maternal Grandmother    Other Maternal Grandmother        memory problems   Stroke Paternal Grandfather    Hypertension Paternal Grandfather    Other Paternal Grandfather        brain tumors   Heart disease Maternal Grandfather    Pancreatitis Maternal Grandfather    Cancer Paternal Grandmother        breast   Heart disease Paternal Grandmother    Kidney disease Paternal Grandmother        kidney failure   Diabetes Paternal Grandmother    Breast cancer Paternal Grandmother    Urticaria Son    Allergic rhinitis Neg Hx    Angioedema Neg Hx    Eczema Neg Hx    Immunodeficiency Neg Hx     OBJECTIVE:  Vitals:   01/24/21 1624  BP: (!) 144/90  Pulse: 95  Resp: 16  Temp: 97.6 F (36.4 C)  TempSrc: Tympanic  SpO2: 97%     General appearance: alert; appears fatigued, but nontoxic; speaking in full sentences and tolerating own secretions HEENT:  NCAT; Ears: EACs clear, TMs pearly gray; Eyes: PERRL.  EOM grossly intact. Nose: nares patent without rhinorrhea, Throat: oropharynx clear, tonsils non erythematous or enlarged, uvula midline  Neck: supple without LAD Lungs:  unlabored respirations, symmetrical air entry; cough: moderate; no respiratory distress; CTAB Heart: regular rate and rhythm.   Skin: warm and dry Psychological: alert and cooperative; normal mood and affect  ASSESSMENT & PLAN:  1. Cough   2. Sinus congestion     Meds ordered this encounter  Medications   amoxicillin-clavulanate (AUGMENTIN) 875-125 MG tablet    Sig: Take 1 tablet by mouth every 12 (twelve) hours for 10 days.    Dispense:  20 tablet    Refill:  0    Order Specific Question:   Supervising Provider    Answer:   Raylene Everts [id number]   HYDROcodone bit-homatropine (HYCODAN) 5-1.5 MG/5ML syrup    Sig: Take 5 mLs by mouth every 12 (twelve) hours as needed for cough.    Dispense:  20 mL    Refill:  0    Order Specific Question:   Supervising Provider    Answer:   Raylene Everts [id number]   Get plenty of rest and push fluids Augmentin prescribed.  Take as directed and to completion Hycodan prescribed.  This may depress your respiratory rate, this is not recommended for cough, but should help you get rest.   Use OTC zyrtec for nasal congestion, runny nose, and/or sore throat Use OTC flonase for nasal congestion and runny nose Use medications daily for symptom relief Use OTC medications like ibuprofen or tylenol as needed fever or pain Follow up with PCP for reevaluation next week Call or go to the ED if you have any new or worsening symptoms such as fever, worsening cough, shortness of breath, chest tightness, chest pain, turning blue, changes in mental status, etc...   Reviewed expectations re: course of current medical issues. Questions answered. Outlined signs and symptoms indicating need for more acute intervention. Patient verbalized  understanding. After Visit Summary given.          Lestine Box, PA-C 01/24/21 1639

## 2021-01-24 NOTE — ED Triage Notes (Signed)
Triage by provider

## 2021-01-25 ENCOUNTER — Telehealth: Payer: Self-pay | Admitting: Emergency Medicine

## 2021-01-25 MED ORDER — PREDNISONE 20 MG PO TABS: 20.0000 mg | ORAL_TABLET | Freq: Two times a day (BID) | ORAL | 0 refills | Status: AC

## 2021-01-25 NOTE — Telephone Encounter (Signed)
Patient calls requesting steroid

## 2021-02-02 ENCOUNTER — Telehealth: Payer: Self-pay | Admitting: Emergency Medicine

## 2021-02-02 MED ORDER — FLUCONAZOLE 150 MG PO TABS: 150.0000 mg | ORAL_TABLET | Freq: Once | ORAL | 0 refills | Status: AC

## 2021-02-07 ENCOUNTER — Ambulatory Visit: Payer: 59 | Admitting: Podiatry

## 2021-02-14 ENCOUNTER — Ambulatory Visit (INDEPENDENT_AMBULATORY_CARE_PROVIDER_SITE_OTHER): Payer: 59 | Admitting: Podiatry

## 2021-02-14 ENCOUNTER — Encounter: Payer: Self-pay | Admitting: Podiatry

## 2021-02-14 ENCOUNTER — Other Ambulatory Visit: Payer: Self-pay

## 2021-02-14 ENCOUNTER — Ambulatory Visit: Payer: 59

## 2021-02-14 DIAGNOSIS — M7751 Other enthesopathy of right foot: Secondary | ICD-10-CM

## 2021-02-14 MED ORDER — TRIAMCINOLONE ACETONIDE 40 MG/ML IJ SUSP
20.0000 mg | Freq: Once | INTRAMUSCULAR | Status: AC
Start: 2021-02-14 — End: 2021-02-14
  Administered 2021-02-14: 20 mg

## 2021-02-14 NOTE — Progress Notes (Signed)
She presents today for follow-up of her lateral ankle pain right states that it got better but now is bothersome again as she refers to the subtalar joint of the right foot.  She states that it feels like it is going to pop as she everts her heel.  She states that which she will be starting with [place] in the next few weeks.  Objective: Vital signs are stable she is alert and oriented x3.  Pulses are palpable.  She has pain on end range of motion of the subtalar joint with inversion feeling that is going to pop.  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  Due to some pronation is most likely her cause.  Plan: Discussed etiology pathology conservative surgical therapies at this point I went ahead and reinjected the subtalar joint today 20 mg Kenalog 5 mg Marcaine point of maximal tenderness and number follow-up with her in a little more than a month for orthotics once she has obtained cones health insurance.

## 2021-03-14 ENCOUNTER — Ambulatory Visit: Payer: 59 | Admitting: Neurology

## 2021-03-26 ENCOUNTER — Ambulatory Visit: Payer: 59 | Admitting: Podiatry

## 2021-04-01 ENCOUNTER — Other Ambulatory Visit (HOSPITAL_COMMUNITY): Payer: Self-pay

## 2021-04-01 MED ORDER — SERTRALINE HCL 100 MG PO TABS
100.0000 mg | ORAL_TABLET | Freq: Every morning | ORAL | 5 refills | Status: DC
  Filled 2021-04-01: qty 30, 30d supply, fill #0

## 2021-04-01 MED ORDER — LEVOCETIRIZINE DIHYDROCHLORIDE 5 MG PO TABS
5.0000 mg | ORAL_TABLET | Freq: Every day | ORAL | 3 refills | Status: DC
  Filled 2021-04-01: qty 90, 90d supply, fill #0
  Filled 2021-08-25: qty 90, 90d supply, fill #1
  Filled 2021-08-26: qty 90, 90d supply, fill #2

## 2021-04-01 MED ORDER — AIMOVIG 140 MG/ML ~~LOC~~ SOAJ
140.0000 mg | SUBCUTANEOUS | 3 refills | Status: DC
  Filled 2021-04-01 – 2021-04-10 (×6): qty 1, 30d supply, fill #0

## 2021-04-01 MED ORDER — BUPROPION HCL ER (XL) 150 MG PO TB24
150.0000 mg | ORAL_TABLET | Freq: Every day | ORAL | 1 refills | Status: DC
  Filled 2021-04-01: qty 30, 30d supply, fill #0

## 2021-04-01 MED ORDER — OMEPRAZOLE 20 MG PO CPDR
20.0000 mg | DELAYED_RELEASE_CAPSULE | Freq: Every day | ORAL | 0 refills | Status: DC
Start: 2021-03-01 — End: 2021-09-09
  Filled 2021-04-01: qty 180, 90d supply, fill #0

## 2021-04-01 MED ORDER — OXYBUTYNIN CHLORIDE 5 MG PO TABS
5.0000 mg | ORAL_TABLET | Freq: Every day | ORAL | 10 refills | Status: DC
  Filled 2021-04-01: qty 30, 30d supply, fill #0
  Filled 2021-05-01: qty 30, 30d supply, fill #1
  Filled 2021-06-10: qty 30, 30d supply, fill #2

## 2021-04-01 MED ORDER — SERTRALINE HCL 50 MG PO TABS
50.0000 mg | ORAL_TABLET | Freq: Every day | ORAL | 1 refills | Status: DC
  Filled 2021-04-01: qty 30, 30d supply, fill #0
  Filled 2021-05-01: qty 30, 30d supply, fill #1

## 2021-04-03 ENCOUNTER — Other Ambulatory Visit (HOSPITAL_COMMUNITY): Payer: Self-pay

## 2021-04-10 ENCOUNTER — Other Ambulatory Visit (HOSPITAL_COMMUNITY): Payer: Self-pay

## 2021-04-17 ENCOUNTER — Other Ambulatory Visit (HOSPITAL_COMMUNITY): Payer: Self-pay

## 2021-04-17 MED ORDER — ALPRAZOLAM 1 MG PO TABS
1.0000 mg | ORAL_TABLET | Freq: Three times a day (TID) | ORAL | 0 refills | Status: DC
  Filled 2021-04-17: qty 90, 30d supply, fill #0

## 2021-04-22 ENCOUNTER — Other Ambulatory Visit (HOSPITAL_COMMUNITY): Payer: Self-pay | Admitting: Physician Assistant

## 2021-04-22 ENCOUNTER — Ambulatory Visit (HOSPITAL_COMMUNITY)
Admission: RE | Admit: 2021-04-22 | Discharge: 2021-04-22 | Disposition: A | Discharge status: Home / Self Care | Payer: 59 | Source: Ambulatory Visit | Attending: Physician Assistant | Admitting: Physician Assistant

## 2021-04-22 ENCOUNTER — Other Ambulatory Visit (HOSPITAL_COMMUNITY): Payer: Self-pay

## 2021-04-22 ENCOUNTER — Other Ambulatory Visit: Payer: Self-pay

## 2021-04-22 DIAGNOSIS — M25562 Pain in left knee: Secondary | ICD-10-CM | POA: Diagnosis present

## 2021-04-22 MED ORDER — ALPRAZOLAM 1 MG PO TABS
1.0000 mg | ORAL_TABLET | Freq: Three times a day (TID) | ORAL | 2 refills | Status: DC
  Filled 2021-05-15 – 2021-05-23 (×2): qty 90, 30d supply, fill #0
  Filled 2021-07-03: qty 90, 30d supply, fill #1
  Filled 2021-08-09: qty 90, 30d supply, fill #2

## 2021-05-01 ENCOUNTER — Other Ambulatory Visit (HOSPITAL_COMMUNITY): Payer: Self-pay

## 2021-05-01 ENCOUNTER — Other Ambulatory Visit: Payer: Self-pay | Admitting: Neurology

## 2021-05-01 MED ORDER — SERTRALINE HCL 100 MG PO TABS
100.0000 mg | ORAL_TABLET | Freq: Every morning | ORAL | 5 refills | Status: DC
  Filled 2021-05-01: qty 30, 30d supply, fill #0
  Filled 2021-06-10: qty 30, 30d supply, fill #1
  Filled 2021-07-08: qty 30, 30d supply, fill #2
  Filled 2021-08-09: qty 30, 30d supply, fill #3
  Filled 2021-09-09: qty 30, 30d supply, fill #4
  Filled 2021-10-08: qty 30, 30d supply, fill #5

## 2021-05-01 MED ORDER — ERENUMAB-AOOE 140 MG/ML ~~LOC~~ SOAJ
140.0000 mg | SUBCUTANEOUS | 0 refills | Status: DC
  Filled 2021-05-01: qty 1, 30d supply, fill #0

## 2021-05-01 MED ORDER — NORTRIPTYLINE HCL 25 MG PO CAPS
25.0000 mg | ORAL_CAPSULE | Freq: Every day | ORAL | 3 refills | Status: DC
  Filled 2021-05-01: qty 90, 90d supply, fill #0

## 2021-05-01 MED ORDER — BUPROPION HCL ER (XL) 150 MG PO TB24
150.0000 mg | ORAL_TABLET | Freq: Every day | ORAL | 1 refills | Status: DC
  Filled 2021-05-01: qty 30, 30d supply, fill #0
  Filled 2021-06-10: qty 30, 30d supply, fill #1

## 2021-05-06 ENCOUNTER — Other Ambulatory Visit (HOSPITAL_COMMUNITY): Payer: Self-pay

## 2021-05-15 ENCOUNTER — Other Ambulatory Visit: Payer: Self-pay

## 2021-05-15 ENCOUNTER — Other Ambulatory Visit: Payer: Self-pay | Admitting: Physician Assistant

## 2021-05-15 DIAGNOSIS — N6310 Unspecified lump in the right breast, unspecified quadrant: Secondary | ICD-10-CM

## 2021-05-15 DIAGNOSIS — N644 Mastodynia: Secondary | ICD-10-CM

## 2021-05-23 ENCOUNTER — Other Ambulatory Visit: Payer: Self-pay | Admitting: Physician Assistant

## 2021-05-23 ENCOUNTER — Other Ambulatory Visit (HOSPITAL_COMMUNITY): Payer: Self-pay

## 2021-05-23 ENCOUNTER — Ambulatory Visit
Admission: RE | Admit: 2021-05-23 | Discharge: 2021-05-23 | Disposition: A | Discharge status: Home / Self Care | Payer: 59 | Source: Ambulatory Visit | Attending: Physician Assistant | Admitting: Physician Assistant

## 2021-05-23 DIAGNOSIS — N6310 Unspecified lump in the right breast, unspecified quadrant: Secondary | ICD-10-CM

## 2021-05-23 DIAGNOSIS — N644 Mastodynia: Secondary | ICD-10-CM

## 2021-06-10 ENCOUNTER — Other Ambulatory Visit (HOSPITAL_COMMUNITY): Payer: Self-pay

## 2021-06-10 MED ORDER — SERTRALINE HCL 50 MG PO TABS
50.0000 mg | ORAL_TABLET | Freq: Every day | ORAL | 1 refills | Status: DC
  Filled 2021-06-10: qty 30, 30d supply, fill #0

## 2021-06-10 MED ORDER — AIMOVIG 140 MG/ML ~~LOC~~ SOAJ
140.0000 mg | SUBCUTANEOUS | 0 refills | Status: DC
  Filled 2021-06-10: qty 1, 30d supply, fill #0

## 2021-06-12 ENCOUNTER — Other Ambulatory Visit (HOSPITAL_COMMUNITY): Payer: Self-pay

## 2021-06-12 MED ORDER — SERTRALINE HCL 50 MG PO TABS
50.0000 mg | ORAL_TABLET | Freq: Every day | ORAL | 4 refills | Status: DC
  Filled 2021-06-12 – 2021-07-08 (×2): qty 90, 90d supply, fill #0
  Filled 2021-10-08: qty 90, 90d supply, fill #1
  Filled 2022-01-06: qty 90, 90d supply, fill #2
  Filled 2022-04-03: qty 90, 90d supply, fill #3

## 2021-06-12 MED ORDER — AIMOVIG 140 MG/ML ~~LOC~~ SOAJ
140.0000 mg | SUBCUTANEOUS | 4 refills | Status: DC
  Filled 2021-06-12: qty 3, 90d supply, fill #0
  Filled 2021-07-03 – 2021-07-04 (×2): qty 1, 30d supply, fill #0
  Filled 2021-07-26 – 2021-07-29 (×2): qty 1, 30d supply, fill #1
  Filled 2021-08-26: qty 1, 30d supply, fill #2
  Filled 2021-09-19 – 2021-10-04 (×4): qty 1, 30d supply, fill #3
  Filled 2021-10-04: qty 1, 30d supply, fill #0
  Filled 2021-10-28: qty 1, 30d supply, fill #1
  Filled 2021-11-21: qty 1, 30d supply, fill #2
  Filled 2021-12-11: qty 1, 30d supply, fill #3
  Filled 2022-01-22 (×2): qty 1, 30d supply, fill #4

## 2021-06-20 ENCOUNTER — Other Ambulatory Visit: Payer: 59

## 2021-06-24 ENCOUNTER — Other Ambulatory Visit (HOSPITAL_COMMUNITY): Payer: Self-pay

## 2021-06-25 ENCOUNTER — Other Ambulatory Visit: Payer: Self-pay

## 2021-06-25 ENCOUNTER — Encounter: Payer: Self-pay | Admitting: Podiatry

## 2021-06-25 ENCOUNTER — Ambulatory Visit (INDEPENDENT_AMBULATORY_CARE_PROVIDER_SITE_OTHER): Payer: 59 | Admitting: Podiatry

## 2021-06-25 DIAGNOSIS — M7751 Other enthesopathy of right foot: Secondary | ICD-10-CM

## 2021-06-25 DIAGNOSIS — K219 Gastro-esophageal reflux disease without esophagitis: Secondary | ICD-10-CM | POA: Insufficient documentation

## 2021-06-25 DIAGNOSIS — S86311A Strain of muscle(s) and tendon(s) of peroneal muscle group at lower leg level, right leg, initial encounter: Secondary | ICD-10-CM | POA: Diagnosis not present

## 2021-06-25 DIAGNOSIS — Z1211 Encounter for screening for malignant neoplasm of colon: Secondary | ICD-10-CM | POA: Insufficient documentation

## 2021-06-25 DIAGNOSIS — R194 Change in bowel habit: Secondary | ICD-10-CM | POA: Insufficient documentation

## 2021-06-25 DIAGNOSIS — R1310 Dysphagia, unspecified: Secondary | ICD-10-CM | POA: Insufficient documentation

## 2021-06-25 DIAGNOSIS — K625 Hemorrhage of anus and rectum: Secondary | ICD-10-CM | POA: Insufficient documentation

## 2021-06-25 NOTE — Progress Notes (Signed)
She presents today for follow-up of capsulitis of her ankle overlying the subtalar joint area.  States that is she still been using the [place]aid but I really let it get bad.  States that she was not able to get back in here in a timely manner.  Objective: Vital signs are stable she is alert and oriented x3.  She still has pain on palpation of the peroneal tendons as well as the sinus tarsi.  Still limited range of motion with severe pain on palpation of the subtalar joint and sinus tarsi.  Assessment probable tear of peroneal tendon and possible coalition of the subtalar joint or osteoarthritis.  Plan: I am requesting MRI at this point for differential diagnosis evaluation of tendon integrity and extent of arthritic change to the subtalar joint.  This is for consult surgical consideration

## 2021-06-28 ENCOUNTER — Other Ambulatory Visit: Payer: Self-pay | Admitting: Physician Assistant

## 2021-06-28 DIAGNOSIS — N6310 Unspecified lump in the right breast, unspecified quadrant: Secondary | ICD-10-CM

## 2021-07-03 ENCOUNTER — Other Ambulatory Visit (HOSPITAL_COMMUNITY): Payer: Self-pay

## 2021-07-04 ENCOUNTER — Other Ambulatory Visit: Payer: Self-pay

## 2021-07-04 ENCOUNTER — Other Ambulatory Visit (HOSPITAL_COMMUNITY): Payer: Self-pay

## 2021-07-04 ENCOUNTER — Ambulatory Visit
Admission: RE | Admit: 2021-07-04 | Discharge: 2021-07-04 | Disposition: A | Discharge status: Home / Self Care | Payer: 59 | Source: Ambulatory Visit | Attending: Podiatry | Admitting: Podiatry

## 2021-07-04 DIAGNOSIS — M7751 Other enthesopathy of right foot: Secondary | ICD-10-CM

## 2021-07-04 DIAGNOSIS — S86311A Strain of muscle(s) and tendon(s) of peroneal muscle group at lower leg level, right leg, initial encounter: Secondary | ICD-10-CM

## 2021-07-08 ENCOUNTER — Encounter: Payer: Self-pay | Admitting: Podiatry

## 2021-07-08 ENCOUNTER — Other Ambulatory Visit (HOSPITAL_COMMUNITY): Payer: Self-pay

## 2021-07-08 MED ORDER — CARESTART COVID-19 HOME TEST VI KIT
PACK | 0 refills | Status: DC
  Filled 2021-07-08: qty 4, 4d supply, fill #0

## 2021-07-08 MED ORDER — BUPROPION HCL ER (XL) 150 MG PO TB24
150.0000 mg | ORAL_TABLET | Freq: Every day | ORAL | 1 refills | Status: DC
  Filled 2021-07-08: qty 30, 30d supply, fill #0
  Filled 2021-08-09: qty 30, 30d supply, fill #1

## 2021-07-15 NOTE — Telephone Encounter (Signed)
Please advise 

## 2021-07-16 ENCOUNTER — Ambulatory Visit
Admission: RE | Admit: 2021-07-16 | Discharge: 2021-07-16 | Disposition: A | Discharge status: Home / Self Care | Payer: 59 | Source: Ambulatory Visit | Attending: Physician Assistant | Admitting: Physician Assistant

## 2021-07-16 DIAGNOSIS — N6310 Unspecified lump in the right breast, unspecified quadrant: Secondary | ICD-10-CM

## 2021-07-16 HISTORY — PX: BREAST BIOPSY: SHX20

## 2021-07-18 ENCOUNTER — Telehealth: Payer: Self-pay | Admitting: *Deleted

## 2021-07-18 NOTE — Telephone Encounter (Signed)
Request sent to [facility] to send MRI disc over to [facility]. Order sent to [facility] requesting review of disc.  Patient notified of delay of results.

## 2021-07-18 NOTE — Telephone Encounter (Signed)
-----   Message from Garrel Ridgel, [place] sent at 07/18/2021  9:40 AM EST ----- Sending MRI for an overread for further review

## 2021-07-22 NOTE — Telephone Encounter (Signed)
Patient is still  having pain her right foot, she wanted to know if she can come in for an appointment for an injection. She needs something to alleviate the pain.   I did add her to the schedule on Thursday   Please advise.

## 2021-07-22 NOTE — Telephone Encounter (Signed)
Okay, that's fine

## 2021-07-22 NOTE — Progress Notes (Signed)
NEUROLOGY FOLLOW UP OFFICE NOTE  Julie [name] [id number]  Assessment/Plan:   Chronic migraine without aura, without status migrainosus, intractable - may be cervicogenic component, increased emotional stress likely contributor.  Neurologic exam unremarkable   Increase nortriptyline to 54m at bedtime.   If no improvement in 6 weeks, contact me and we can increase dose Continue Aimovig When headache gets severe, try Reyvow 1043m(1 in 24 hours).  Let me know if effective.  No driving for 8 hours after use Limit use of pain relievers to no more than 2 days out of week to prevent risk of rebound or medication-overuse headache. Keep headache diary Recommended getting a formal eye exam to evaluate for other potential secondary etiologies Order massage therapy as it is effective for her headaches. Follow up 4 months.  Subjective:  Julie Paulettes a [age]ear old female with depression, anxiety and kidney stone who follows up for migraine.   UPDATE: Last seen in November 2021.  Endorsed neck pain with bilateral upper extremity pain, numbness and weakness.  Started on gabapentin which was ineffective and was subsequently switched to nortriptyline.  MRI of cervical spine on 06/19/2020 personally reviewed revealed mild multilevel degenerative changes with right paracentral disc protrusion at C3-4 abutting the ventral cord and mild right neural foraminal narrowing at C3-4 and C4-5 but no significant spinal canal stenosis.  Headaches had been well-controlled until end of August.  It is a persistent 8/10 headache.  Has not been treating them with any analgesics or NSAIDs.  She may take Tylenol 2 times in a month.  Massage therapy helps.  Reports increased emotional stress.  Last year, her grandmother passed away from terminal cancer, her uncle died of an MI, and her aunt died from cancer.  She also reports diffuse body pain that is aggravated by just light touch.  It is a stabbing pain and soreness.   Also reports that she is bruising more easily.    Current NSAIDS: None Current analgesics: Excedrin Migraine (rarely, ineffective) Current triptans: None Current ergotamine: None Current anti-emetic: None Current muscle relaxants: none Current anti-anxiolytic: Alprazolam 1 mg as needed Current sleep aide: None Current Antihypertensive medications: None Current Antidepressant medications: Nortriptyline 4484mHS, sertraline 100 mg daily Current Anticonvulsant medications: none Current anti-CGRP: Aimovig 140m39mnthly Current Vitamins/Herbal/Supplements: None Current Antihistamines/Decongestants: None Other therapy: Daith piercing bilaterally, chiropractor Hormone/birth control: progesterone   Caffeine: One cup of coffee daily, tea at dinner Alcohol: Occasional Smoker: No Diet: 4 glasses of water daily; does not eat breakfast. Exercise: Not routine Depression: Yes; Anxiety: Yes Other pain: Diffuse body aches Sleep hygiene: Sleeps but not rested.  Sleep study negative for OSA.   HISTORY: Onset: She developed migraines in high school but resolved.  Migraines returned at age 46 y3rs old after birth of her son. Location:  Back of head bilaterally, top of head, radiating down neck into shoulders Quality:  Vice-grip Initial intensity:  5-10/10 (usually 10/10).  She denies new headache, thunderclap headache Aura:  Sometimes phantosmia (chemicals, exhaust, burning) Prodrome:  no Postdrome:  fatigue Associated symptoms: Nausea, photophobia, phonophobia, blurred vision.  She denies associated unilateral numbness or weakness. Initial duration:  Constant. Often wakes up with it. Initial Frequency:  daily Initial Frequency of abortive medication: none Triggers: None Relieving factors:  Applying pressure to upper paraspinal region, resting in dark and quiet place Activity:  aggravates   Workup included MRI of brain without contrast from 10//19/16, which was personally reviewed and was  unremarkable.   Past  NSAIDS:  Ibuprofen, naproxen, indomethacin 19m three times daily, Toradol shot (helps) Past analgesics:  Fioricet with codeine, Excedrin, Tylenol Past abortive triptans:  Sumatriptan 1059m(made her sick), rizatriptan 1036meletritan 19m8mst abortive ergotamine:  Cafergot (caused throat swelling) Past muscle relaxants:  Flexeril, tizanidine 4mg 73m Past anti-emetic:  Zofran (sometimes helps) Past antihypertensive medications:  Propranolol 80mg 34me daily Past antidepressant medications:  Amitriptyline 25mg a83mdtime, Wellbutrin Past anticonvulsant medications:  topiramate 150mg, z110mamide 25mg at 52mime, gabapentin 400mg QHS 35m anti-CGRP:  Nurtec, Ubrelvy Past vitamins/Herbal/Supplements:  none Past antihistamines/decongestants:  none Other past therapies:  Trigger point/nerve blocks, cognitive behavioral therapy, acupuncture, biofeedback, Botox (1 round, stopped due to cost related to change in insurance).   Family history of headache:  No  PAST MEDICAL HISTORY: Past Medical History:  Diagnosis Date   Anemia    Anemia    Anxiety    Breast discharge 07/01/2013   Pt noticed discharge, none on exam today will check TSH and Prolactin   Breast mass, right 10/20/2012   Depression    Dysmenorrhea 09/19/2015   Dyspareunia in female 09/19/2015   Ectopic pregnancy    Eczema    GERD (gastroesophageal reflux disease)    no meds   Headache    Hernia of abdominal wall 01/04/2013   Kidney stone    Left breast mass 04/12/2014   Has pea sized nodule at 6 o'clock tender and mobile, will get mammogram and [place]   Menorrhagia with regular cycle 09/19/2015   Pelvic pain in female 09/19/2015   Placenta previa 2012   Current pregnancy    Urinary tract infection    Vaginal discharge 07/01/2013    MEDICATIONS: Current Outpatient Medications on File Prior to Visit  Medication Sig Dispense Refill   AIMOVIG 140 MG/ML SOAJ Inject into the skin every 30 (thirty) days.      ALPRAZolam (XANAX) 1 MG tablet Take 0.5-1 mg by mouth 3 (three) times daily as needed for anxiety.     ALPRAZolam (XANAX) 1 MG tablet Take 1 tablet (1 mg total) by mouth 3 (three) times daily. 90 tablet 2   Ascorbic Acid (VITAMIN C) 1000 MG tablet Take 1,000 mg by mouth daily.     buPROPion (WELLBUTRIN XL) 150 MG 24 hr tablet Take 1 tablet (150 mg total) by mouth daily. 30 tablet 1   cetirizine-pseudoephedrine (ZYRTEC-D) 5-120 MG tablet Take 1 tablet by mouth daily. 30 tablet 0   COVID-19 At Home Antigen Test (CARESTART COVID-19 HOME TEST) KIT Use as directed 4 each 0   Erenumab-aooe (AIMOVIG) 140 MG/ML SOAJ Inject 140 mg into the skin once monthly as directed 3 mL 4   furosemide (LASIX) 40 MG tablet Take 40 mg by mouth as needed.      levocetirizine (XYZAL) 5 MG tablet Take 1 tablet (5 mg total) by mouth daily. 90 tablet 3   nortriptyline (PAMELOR) 25 MG capsule Take 1 capsule (25 mg total) by mouth at bedtime. 90 capsule 3   omeprazole (PRILOSEC) 20 MG capsule Take 1 capsule (20 mg total) by mouth twice daily. 180 capsule 0   oxybutynin (DITROPAN) 5 MG tablet Take 1 tablet (5 mg total) by mouth daily. 30 tablet 10   Potassium 99 MG TABS Take by mouth.     sertraline (ZOLOFT) 100 MG tablet Take 1 tablet (100 mg total) by mouth every morning. 30 tablet 5   sertraline (ZOLOFT) 50 MG tablet Take 1 tablet (50 mg total) by mouth daily.  Take with 100 mg tablet 90 tablet 4   Vitamin D, Ergocalciferol, (DRISDOL) 1.25 MG (50000 UNIT) CAPS capsule Take by mouth.     No current facility-administered medications on file prior to visit.    ALLERGIES: Allergies  Allergen Reactions   Clarithromycin Other (See Comments)    Makes throat raw   Topamax [Topiramate] Itching   Tramadol     FAMILY HISTORY: Family History  Problem Relation Age of Onset   Depression Mother    Cancer Mother    Seizures Mother    Bipolar disorder Mother    Asthma Mother    Atrial fibrillation Mother    Hypertension  Father    Asthma Father    Heart disease Maternal Grandmother    Cancer Maternal Grandmother        oral; lymph nodes   Hypertension Maternal Grandmother    Other Maternal Grandmother        memory problems   Stroke Paternal Grandfather    Hypertension Paternal Grandfather    Other Paternal Grandfather        brain tumors   Heart disease Maternal Grandfather    Pancreatitis Maternal Grandfather    Cancer Paternal Grandmother        breast   Heart disease Paternal Grandmother    Kidney disease Paternal Grandmother        kidney failure   Diabetes Paternal Grandmother    Breast cancer Paternal Grandmother    Urticaria Son    Allergic rhinitis Neg Hx    Angioedema Neg Hx    Eczema Neg Hx    Immunodeficiency Neg Hx       Objective:  Blood pressure 132/70, pulse 90, height 5' 7"  (1.702 m), weight 295 lb 12.8 oz (134.2 kg), last menstrual period 09/26/2015, SpO2 98 %. General: No acute distress.  Patient appears well-groomed.   Head:  Normocephalic/atraumatic Eyes:  Fundi examined but not visualized Neck: supple, no paraspinal tenderness, full range of motion Heart:  Regular rate and rhythm Lungs:  Clear to auscultation bilaterally Back: No paraspinal tenderness Neurological Exam: alert and oriented to person, place, and time.  Speech fluent and not dysarthric, language intact.  CN II-XII intact. Bulk and tone normal, muscle strength 5/5 throughout.  Sensation to light touch intact.  Deep tendon reflexes 2+ throughout, toes downgoing.  Finger to nose testing intact.  Gait normal, Romberg negative.   Julie Clines, DO  CC: Sharilyn Sites, MD

## 2021-07-23 ENCOUNTER — Ambulatory Visit: Payer: 59 | Admitting: Neurology

## 2021-07-23 ENCOUNTER — Encounter: Payer: Self-pay | Admitting: Neurology

## 2021-07-23 ENCOUNTER — Other Ambulatory Visit (HOSPITAL_COMMUNITY): Payer: Self-pay

## 2021-07-23 ENCOUNTER — Other Ambulatory Visit: Payer: Self-pay

## 2021-07-23 VITALS — BP 132/70 | HR 90 | Ht 67.0 in | Wt 295.8 lb

## 2021-07-23 DIAGNOSIS — G43719 Chronic migraine without aura, intractable, without status migrainosus: Secondary | ICD-10-CM

## 2021-07-23 MED ORDER — NORTRIPTYLINE HCL 50 MG PO CAPS
50.0000 mg | ORAL_CAPSULE | Freq: Every day | ORAL | 5 refills | Status: DC
  Filled 2021-07-23 – 2021-11-29 (×3): qty 30, 30d supply, fill #0
  Filled 2022-01-06: qty 30, 30d supply, fill #1
  Filled 2022-02-04: qty 30, 30d supply, fill #2

## 2021-07-23 MED ORDER — NORTRIPTYLINE HCL 50 MG PO CAPS: 50.0000 mg | ORAL_CAPSULE | Freq: Every day | ORAL | 5 refills | Status: DC

## 2021-07-23 NOTE — Progress Notes (Signed)
Medication Samples have been provided to the patient.  Drug name: Reyvow       Strength: 100 mg        Qty: 2  LOT: [id number]  Exp.Date: 11/2023  Dosing instructions: as needed  The patient has been instructed regarding the correct time, dose, and frequency of taking this medication, including desired effects and most common side effects.   Venetia Night 10:19 AM 07/23/2021

## 2021-07-23 NOTE — Patient Instructions (Signed)
Increase nortriptyline to 50mg  at bedtime.  If no improvement in 6 weeks, contact me and we can increase dose Continue Aimovig When headache gets severe, try Reyvow 100mg  (1 in 24 hours).  Let me know if effective.  No driving for 8 hours after use Limit use of pain relievers to no more than 2 days out of week to prevent risk of rebound or medication-overuse headache. Keep headache diary Get an eye exam Massage therapy Follow up 4 months.

## 2021-07-25 ENCOUNTER — Ambulatory Visit: Payer: 59 | Admitting: Podiatry

## 2021-07-25 ENCOUNTER — Encounter: Payer: Self-pay | Admitting: Podiatry

## 2021-07-25 ENCOUNTER — Telehealth: Payer: Self-pay | Admitting: Podiatry

## 2021-07-25 ENCOUNTER — Other Ambulatory Visit: Payer: Self-pay

## 2021-07-25 DIAGNOSIS — M7751 Other enthesopathy of right foot: Secondary | ICD-10-CM | POA: Diagnosis not present

## 2021-07-25 MED ORDER — TRIAMCINOLONE ACETONIDE 40 MG/ML IJ SUSP
20.0000 mg | Freq: Once | INTRAMUSCULAR | Status: AC
  Administered 2021-07-25: 20 mg

## 2021-07-25 NOTE — Telephone Encounter (Signed)
Responded to patient's MyChart message. 

## 2021-07-25 NOTE — Telephone Encounter (Signed)
Pt called and seen your response on my chart but would like a call. She stated the pain is really bad and she cannot put any weight on her foot at all.

## 2021-07-25 NOTE — Telephone Encounter (Signed)
Pt called stating she was given a shot this morning in her foot and her foot is now tingling and very painful, with some numbness as well. Please advise

## 2021-07-26 ENCOUNTER — Other Ambulatory Visit: Payer: Self-pay | Admitting: Podiatry

## 2021-07-26 ENCOUNTER — Other Ambulatory Visit (HOSPITAL_COMMUNITY): Payer: Self-pay

## 2021-07-26 MED ORDER — GABAPENTIN 100 MG PO CAPS: 100.0000 mg | ORAL_CAPSULE | Freq: Three times a day (TID) | ORAL | 0 refills | Status: DC

## 2021-07-26 NOTE — Telephone Encounter (Signed)
Sent message from patient to Dr. Posey Pronto to advise.

## 2021-07-27 NOTE — Progress Notes (Signed)
She presents today for follow-up of her right foot states that it is in a lot of pain she would like to have a shot if possible MRI was sent out for an over read but if there is anything that can be done which she would like to do it.  Objective: Vital signs are stable she is alert and oriented x3.  Pulses are palpable.  She has mild pain on palpation of the subtalar joint right and mild tenderness on palpation of the peroneals with some swelling laterally.  Initial read on her MRI demonstrates mild irregularity of the peroneus brevis and plantar calcaneal heel spur mild thinning of the cartilage of the subtalar joint and osseous edema of the bone and in the soft tissues of the subtalar joint.  Assessment: Subtalar joint capsulitis peroneal tendinitis.  Plan: Discussed etiology pathology and surgical therapies at this point I injected the subtalar joint Kenalog and local anesthetic and I will follow-up with her once her overread comes in.

## 2021-07-29 ENCOUNTER — Encounter: Payer: Self-pay | Admitting: Podiatry

## 2021-07-29 ENCOUNTER — Other Ambulatory Visit (HOSPITAL_COMMUNITY): Payer: Self-pay

## 2021-07-30 ENCOUNTER — Encounter: Payer: Self-pay | Admitting: Podiatry

## 2021-08-01 ENCOUNTER — Ambulatory Visit (INDEPENDENT_AMBULATORY_CARE_PROVIDER_SITE_OTHER): Payer: 59 | Admitting: Podiatry

## 2021-08-01 ENCOUNTER — Telehealth: Payer: Self-pay | Admitting: Urology

## 2021-08-01 ENCOUNTER — Other Ambulatory Visit: Payer: Self-pay

## 2021-08-01 DIAGNOSIS — S86311D Strain of muscle(s) and tendon(s) of peroneal muscle group at lower leg level, right leg, subsequent encounter: Secondary | ICD-10-CM

## 2021-08-01 NOTE — Telephone Encounter (Signed)
DOS - 08/09/21  REPAIR PERONEAL TENDON RIGHT --- [id number]   [facility] EFFECTIVE DATE - 06/09/21  PLAN DEDUCTIBLE - $350.00 W/ $0.00 REMAINING OUT OF POCKET - $7900.00 W/ $6,859.92 REMAINING COINSURANCE - 40% COPAY - $0.00  SPOKE WITH ERIN WITH [facility] AND SHE STATED THAT FOR CPT CODE 34144 NO PRIOR AUTH IS REQUIRED.   REF # [id number] - [id number]

## 2021-08-02 DIAGNOSIS — M79676 Pain in unspecified toe(s): Secondary | ICD-10-CM

## 2021-08-03 NOTE — Progress Notes (Signed)
Julie [name] presents today for follow-up of her MRI results she reports that after getting the injection it did make the foot feel some better but she would like to go ahead and get this thing fixed because of still not allowing her to do what she would like to do.  Objective: Vital signs are stable she is alert and oriented x3 MRI does demonstrate a peroneal fraying just distal to the lateral malleolus.  Assessment: Peroneal tendon tear  Plan: At this point were going to consent her today for peroneal tendon repair cast application which will be nonweightbearing and then a PRP injection to the area to help the area heal.  She understands this and is amendable to it.  We did discuss the possible postop complications which may include but are not limited to postop pain bleeding swelling infection recurrence need for further surgery overcorrection under correction also digit loss of limb loss of life.  She signed all 3 pages consent form surgical be performed in the next couple of weeks.

## 2021-08-05 ENCOUNTER — Telehealth: Payer: Self-pay

## 2021-08-05 NOTE — Telephone Encounter (Signed)
Julie [name] called to cancel her surgery with Dr. Milinda Pointer on 08/09/2021. She stated [employer] will not approve her FMLA leave at this time. Notified Dr. Milinda Pointer and Caren Griffins with [facility]

## 2021-08-09 ENCOUNTER — Other Ambulatory Visit (HOSPITAL_COMMUNITY): Payer: Self-pay

## 2021-08-09 ENCOUNTER — Other Ambulatory Visit: Payer: Self-pay

## 2021-08-15 ENCOUNTER — Encounter: Payer: 59 | Admitting: Podiatry

## 2021-08-15 ENCOUNTER — Ambulatory Visit: Payer: 59 | Admitting: Podiatry

## 2021-08-20 ENCOUNTER — Other Ambulatory Visit (HOSPITAL_COMMUNITY): Payer: Self-pay

## 2021-08-20 MED ORDER — WEGOVY 0.25 MG/0.5ML ~~LOC~~ SOAJ
0.2500 mg | SUBCUTANEOUS | 2 refills | Status: DC
Start: 2021-08-20 — End: 2022-04-16
  Filled 2021-08-20 – 2021-09-18 (×2): qty 2, 28d supply, fill #0
  Filled 2021-10-17: qty 2, 28d supply, fill #1
  Filled 2022-01-22 – 2022-02-12 (×2): qty 2, 28d supply, fill #2

## 2021-08-21 ENCOUNTER — Other Ambulatory Visit (HOSPITAL_COMMUNITY): Payer: Self-pay

## 2021-08-22 ENCOUNTER — Encounter: Payer: 59 | Admitting: Podiatry

## 2021-08-26 ENCOUNTER — Other Ambulatory Visit (HOSPITAL_COMMUNITY): Payer: Self-pay

## 2021-08-26 MED ORDER — NORTRIPTYLINE HCL 50 MG PO CAPS
50.0000 mg | ORAL_CAPSULE | Freq: Every day | ORAL | 5 refills | Status: DC
  Filled 2021-08-26: qty 30, 30d supply, fill #0
  Filled 2021-09-26: qty 30, 30d supply, fill #1

## 2021-08-28 ENCOUNTER — Other Ambulatory Visit (HOSPITAL_COMMUNITY): Payer: Self-pay

## 2021-08-28 ENCOUNTER — Telehealth: Payer: Self-pay | Admitting: Podiatry

## 2021-08-28 ENCOUNTER — Encounter: Payer: Self-pay | Admitting: Podiatry

## 2021-08-28 MED ORDER — CELECOXIB 200 MG PO CAPS: 200.0000 mg | ORAL_CAPSULE | Freq: Two times a day (BID) | ORAL | 3 refills | Status: DC

## 2021-08-28 NOTE — Telephone Encounter (Signed)
Called pt to schedule an appt for an injection and she said it has not even been a month since she got her last injection and does not feel the injections are working. As far as surgery she is an [employer] employee and they are telling her she has not been employed long enough to get fmla. Do you have any other options? ?

## 2021-09-03 ENCOUNTER — Encounter: Payer: Self-pay | Admitting: Emergency Medicine

## 2021-09-03 ENCOUNTER — Ambulatory Visit
Admission: EM | Admit: 2021-09-03 | Discharge: 2021-09-03 | Disposition: A | Discharge status: Home / Self Care | Payer: 59 | Attending: Urgent Care | Admitting: Urgent Care

## 2021-09-03 ENCOUNTER — Other Ambulatory Visit: Payer: Self-pay

## 2021-09-03 DIAGNOSIS — E860 Dehydration: Secondary | ICD-10-CM | POA: Diagnosis present

## 2021-09-03 DIAGNOSIS — M549 Dorsalgia, unspecified: Secondary | ICD-10-CM | POA: Insufficient documentation

## 2021-09-03 DIAGNOSIS — M545 Low back pain, unspecified: Secondary | ICD-10-CM | POA: Insufficient documentation

## 2021-09-03 DIAGNOSIS — M546 Pain in thoracic spine: Secondary | ICD-10-CM | POA: Diagnosis not present

## 2021-09-03 DIAGNOSIS — M5030 Other cervical disc degeneration, unspecified cervical region: Secondary | ICD-10-CM | POA: Diagnosis not present

## 2021-09-03 LAB — POCT URINALYSIS DIP (MANUAL ENTRY)
Bilirubin, UA: NEGATIVE
Blood, UA: NEGATIVE
Glucose, UA: NEGATIVE mg/dL
Ketones, POC UA: NEGATIVE mg/dL
Leukocytes, UA: NEGATIVE
Nitrite, UA: NEGATIVE
Protein Ur, POC: NEGATIVE mg/dL
Spec Grav, UA: >=1.03 — AB (ref 1.010–1.025)
Urobilinogen, UA: 0.2 E.U./dL
pH, UA: 5.5 (ref 5.0–8.0)

## 2021-09-03 MED ORDER — NAPROXEN 500 MG PO TABS: 500.0000 mg | ORAL_TABLET | Freq: Two times a day (BID) | ORAL | 0 refills | Status: DC

## 2021-09-03 MED ORDER — TIZANIDINE HCL 4 MG PO TABS: 4.0000 mg | ORAL_TABLET | Freq: Every day | ORAL | 0 refills | Status: DC

## 2021-09-03 NOTE — ED Triage Notes (Signed)
Mid and lower back pain x 3 days.  Hurts to move certain ways at times. ?

## 2021-09-03 NOTE — ED Provider Notes (Addendum)
?[place] ? ? ?MRN: [id number] DOB: 1975/08/08 ? ?Subjective:  ? ?Julie [name] is a 46 y.o. female presenting for 3-day history of acute onset persistent mid to low back pain over either side.  No fall, trauma, weakness, numbness or tingling, dysuria, hematuria.  No history of kidney stones.  No fevers.  Patient does not do a lot of heavy lifting with her work.  Has past medical history of degenerative disc disease with foraminal narrowing, disc protrusion at the cervical region.  Results are as below as seen on 06/19/2020.  The neurologist she is working with is managing this conservatively. ? ?No current facility-administered medications for this encounter. ? ?Current Outpatient Medications:  ?  AIMOVIG 140 MG/ML SOAJ, Inject into the skin every 30 (thirty) days., Disp: , Rfl:  ?  ALPRAZolam (XANAX) 1 MG tablet, Take 0.5-1 mg by mouth 3 (three) times daily as needed for anxiety., Disp: , Rfl:  ?  ALPRAZolam (XANAX) 1 MG tablet, Take 1 tablet (1 mg total) by mouth 3 (three) times daily., Disp: 90 tablet, Rfl: 2 ?  Ascorbic Acid (VITAMIN C) 1000 MG tablet, Take 1,000 mg by mouth daily., Disp: , Rfl:  ?  buPROPion (WELLBUTRIN XL) 150 MG 24 hr tablet, Take 1 tablet (150 mg total) by mouth daily., Disp: 30 tablet, Rfl: 1 ?  celecoxib (CELEBREX) 200 MG capsule, Take 1 capsule (200 mg total) by mouth 2 (two) times daily., Disp: 60 capsule, Rfl: 3 ?  cetirizine-pseudoephedrine (ZYRTEC-D) 5-120 MG tablet, Take 1 tablet by mouth daily., Disp: 30 tablet, Rfl: 0 ?  Erenumab-aooe (AIMOVIG) 140 MG/ML SOAJ, Inject 140 mg into the skin once monthly as directed, Disp: 3 mL, Rfl: 4 ?  furosemide (LASIX) 40 MG tablet, Take 40 mg by mouth as needed. , Disp: , Rfl:  ?  gabapentin (NEURONTIN) 100 MG capsule, Take 1 capsule (100 mg total) by mouth 3 (three) times daily., Disp: 30 capsule, Rfl: 0 ?  levocetirizine (XYZAL) 5 MG tablet, Take 1 tablet (5 mg total) by mouth daily., Disp: 90 tablet, Rfl: 3 ?   nortriptyline (PAMELOR) 50 MG capsule, Take 1 capsule (50 mg total) by mouth at bedtime., Disp: 30 capsule, Rfl: 5 ?  nortriptyline (PAMELOR) 50 MG capsule, Take 1 capsule (50 mg total) by mouth at bedtime., Disp: 30 capsule, Rfl: 5 ?  omeprazole (PRILOSEC) 20 MG capsule, Take 1 capsule (20 mg total) by mouth twice daily., Disp: 180 capsule, Rfl: 0 ?  oxybutynin (DITROPAN) 5 MG tablet, Take 1 tablet (5 mg total) by mouth daily., Disp: 30 tablet, Rfl: 10 ?  Potassium 99 MG TABS, Take by mouth., Disp: , Rfl:  ?  Semaglutide-Weight Management (WEGOVY) 0.25 MG/0.5ML SOAJ, Inject 0.25 mg into the skin once a week., Disp: 2 mL, Rfl: 2 ?  sertraline (ZOLOFT) 100 MG tablet, Take 1 tablet (100 mg total) by mouth every morning., Disp: 30 tablet, Rfl: 5 ?  sertraline (ZOLOFT) 50 MG tablet, Take 1 tablet (50 mg total) by mouth daily. Take with 100 mg tablet, Disp: 90 tablet, Rfl: 4  ? ?Allergies  ?Allergen Reactions  ? Biaxin [Clarithromycin]   ? Clarithromycin Other (See Comments)  ?  Makes throat raw  ? Topamax [Topiramate] Itching  ? Tramadol   ? ? ?Past Medical History:  ?Diagnosis Date  ? Anemia   ? Anemia   ? Anxiety   ? Breast discharge 07/01/2013  ? Pt noticed discharge, none on exam today will check TSH and Prolactin  ?  Breast mass, right 10/20/2012  ? Depression   ? Dysmenorrhea 09/19/2015  ? Dyspareunia in female 09/19/2015  ? Ectopic pregnancy   ? Eczema   ? GERD (gastroesophageal reflux disease)   ? no meds  ? Headache   ? Hernia of abdominal wall 01/04/2013  ? Kidney stone   ? Left breast mass 04/12/2014  ? Has pea sized nodule at 6 o'clock tender and mobile, will get mammogram and [place] if needed  ? Menorrhagia with regular cycle 09/19/2015  ? Pelvic pain in female 09/19/2015  ? Placenta previa 2012  ? Current pregnancy   ? Urinary tract infection   ? Vaginal discharge 07/01/2013  ?  ? ?Past Surgical History:  ?Procedure Laterality Date  ? ABDOMINAL HYSTERECTOMY N/A 10/10/2015  ? Procedure: HYSTERECTOMY ABDOMINAL;  Surgeon:  Florian Buff, MD;  Location: [facility];  Service: Gynecology;  Laterality: N/A;  ? APPENDECTOMY    ? APPENDECTOMY    ? BILATERAL SALPINGECTOMY Bilateral 10/10/2015  ? Procedure: BILATERAL SALPINGECTOMY;  Surgeon: Florian Buff, MD;  Location: [facility];  Service: Gynecology;  Laterality: Bilateral;  ? BREAST BIOPSY Right 11/04/2012  ? hylanized tissue with minimal chronic inflammatory tissue  ? BREAST BIOPSY Right 07/16/2021  ? CESAREAN SECTION  04/24/2011  ? Procedure: CESAREAN SECTION;  Surgeon: Florian Buff, MD;  Location: [facility];  Service: Gynecology;  Laterality: N/A;  Primary/Previa  ? CHOLECYSTECTOMY    ? COLONOSCOPY    ? CYSTOSCOPY W/ URETERAL STENT PLACEMENT Left 08/04/2014  ? Procedure: CYSTOSCOPY WITH LEFT  RETROGRADE PYELOGRAM/ LEFT URETERAL STENT PLACEMENT;  Surgeon: Festus Aloe, MD;  Location: [facility];  Service: Urology;  Laterality: Left;  ? TONSILLECTOMY    ? UPPER GI ENDOSCOPY    ? ? ?Family History  ?Problem Relation Age of Onset  ? Depression Mother   ? Cancer Mother   ? Seizures Mother   ? Bipolar disorder Mother   ? Asthma Mother   ? Atrial fibrillation Mother   ? Hypertension Father   ? Asthma Father   ? Heart disease Maternal Grandmother   ? Cancer Maternal Grandmother   ?     oral; lymph nodes  ? Hypertension Maternal Grandmother   ? Other Maternal Grandmother   ?     memory problems  ? Stroke Paternal Grandfather   ? Hypertension Paternal Grandfather   ? Other Paternal Grandfather   ?     brain tumors  ? Heart disease Maternal Grandfather   ? Pancreatitis Maternal Grandfather   ? Cancer Paternal Grandmother   ?     breast  ? Heart disease Paternal Grandmother   ? Kidney disease Paternal Grandmother   ?     kidney failure  ? Diabetes Paternal Grandmother   ? Breast cancer Paternal Grandmother   ? Urticaria Son   ? Allergic rhinitis Neg Hx   ? Angioedema Neg Hx   ? Eczema Neg Hx   ? Immunodeficiency Neg Hx   ? ? ?Social History  ? ?Tobacco Use  ? Smoking status: Former  ?  Years: 14.00  ?   Types: Cigarettes  ?  Quit date: 09/07/2009  ?  Years since quitting: 11.9  ? Smokeless tobacco: Never  ?Vaping Use  ? Vaping Use: Never used  ?Substance Use Topics  ? Alcohol use: Yes  ?  Comment: occ mixed drink  ? Drug use: No  ? ? ?ROS ? ? ?Objective:  ? ?Vitals: ?BP 122/84 (BP Location: Right Arm)  Pulse 86   Temp 97.7 ?F (36.5 ?C) (Oral)   Resp 18   LMP 09/26/2015 (Exact Date)   SpO2 99%  ? ?Physical Exam ?Constitutional:   ?   General: She is not in acute distress. ?   Appearance: Normal appearance. She is well-developed. She is not ill-appearing, toxic-appearing or diaphoretic.  ?HENT:  ?   Head: Normocephalic and atraumatic.  ?   Nose: Nose normal.  ?   Mouth/Throat:  ?   Mouth: Mucous membranes are moist.  ?Eyes:  ?   General: No scleral icterus.    ?   Right eye: No discharge.     ?   Left eye: No discharge.  ?   Extraocular Movements: Extraocular movements intact.  ?Cardiovascular:  ?   Rate and Rhythm: Normal rate.  ?Pulmonary:  ?   Effort: Pulmonary effort is normal.  ?Abdominal:  ?   Tenderness: There is left CVA tenderness. There is no right CVA tenderness.  ?Musculoskeletal:     ?   General: Normal range of motion.  ?   Lumbar back: Spasms and tenderness present. No swelling, edema, deformity, signs of trauma, lacerations or bony tenderness. Normal range of motion. Negative right straight leg raise test and negative left straight leg raise test. No scoliosis.  ?     Back: ? ?   Comments: Strength 5/5 for lower extremities.  ?Skin: ?   General: Skin is warm and dry.  ?Neurological:  ?   General: No focal deficit present.  ?   Mental Status: She is alert and oriented to person, place, and time.  ?   Motor: No weakness.  ?   Coordination: Coordination normal.  ?   Gait: Gait normal.  ?   Deep Tendon Reflexes: Reflexes normal.  ?Psychiatric:     ?   Mood and Affect: Mood normal.     ?   Behavior: Behavior normal.     ?   Thought Content: Thought content normal.     ?   Judgment: Judgment normal.   ? ?MR Cervical 06/19/2020 ?IMPRESSION: ?Mild multilevel degenerative changes. ?  ?C3-4 right paracentral protrusion abutting the ventral cord. No ?significant spinal canal narrowing or cord impingement. ?  ?Mild right

## 2021-09-05 ENCOUNTER — Encounter: Payer: 59 | Admitting: Podiatry

## 2021-09-05 LAB — URINE CULTURE: Culture: <10000 — AB

## 2021-09-09 ENCOUNTER — Other Ambulatory Visit (HOSPITAL_COMMUNITY): Payer: Self-pay

## 2021-09-09 MED ORDER — OMEPRAZOLE 20 MG PO CPDR
20.0000 mg | DELAYED_RELEASE_CAPSULE | Freq: Two times a day (BID) | ORAL | 2 refills | Status: DC
  Filled 2021-09-09 – 2021-12-11 (×2): qty 180, 90d supply, fill #0
  Filled 2022-03-10: qty 180, 90d supply, fill #1

## 2021-09-09 MED ORDER — BUPROPION HCL ER (XL) 150 MG PO TB24
150.0000 mg | ORAL_TABLET | Freq: Every day | ORAL | 1 refills | Status: DC
  Filled 2021-09-09: qty 90, 90d supply, fill #0

## 2021-09-10 ENCOUNTER — Other Ambulatory Visit (HOSPITAL_COMMUNITY): Payer: Self-pay

## 2021-09-10 MED ORDER — BUPROPION HCL ER (XL) 150 MG PO TB24
150.0000 mg | ORAL_TABLET | Freq: Every day | ORAL | 1 refills | Status: DC
  Filled 2021-09-10: qty 90, 90d supply, fill #0

## 2021-09-11 ENCOUNTER — Other Ambulatory Visit (HOSPITAL_COMMUNITY): Payer: Self-pay

## 2021-09-11 MED ORDER — AMOXICILLIN-POT CLAVULANATE 875-125 MG PO TABS
1.0000 | ORAL_TABLET | Freq: Two times a day (BID) | ORAL | 0 refills | Status: DC
  Filled 2021-09-11: qty 20, 10d supply, fill #0

## 2021-09-18 ENCOUNTER — Other Ambulatory Visit (HOSPITAL_COMMUNITY): Payer: Self-pay

## 2021-09-19 ENCOUNTER — Encounter: Payer: 59 | Admitting: Podiatry

## 2021-09-19 ENCOUNTER — Other Ambulatory Visit (HOSPITAL_COMMUNITY): Payer: Self-pay

## 2021-09-19 ENCOUNTER — Ambulatory Visit: Payer: 59 | Admitting: Neurology

## 2021-09-20 ENCOUNTER — Other Ambulatory Visit (HOSPITAL_COMMUNITY): Payer: Self-pay

## 2021-09-23 ENCOUNTER — Other Ambulatory Visit (HOSPITAL_COMMUNITY): Payer: Self-pay

## 2021-09-23 MED ORDER — BUPROPION HCL ER (XL) 150 MG PO TB24
150.0000 mg | ORAL_TABLET | Freq: Every day | ORAL | 3 refills | Status: DC
  Filled 2021-12-02: qty 90, 90d supply, fill #0
  Filled 2022-02-28: qty 90, 90d supply, fill #1
  Filled 2022-05-28: qty 87, 87d supply, fill #0
  Filled 2022-05-28: qty 3, 3d supply, fill #0
  Filled 2022-05-28: qty 90, 90d supply, fill #2
  Filled 2022-08-18: qty 90, 90d supply, fill #1

## 2021-09-24 ENCOUNTER — Other Ambulatory Visit (HOSPITAL_COMMUNITY): Payer: Self-pay

## 2021-09-24 MED ORDER — ALPRAZOLAM 1 MG PO TABS
1.0000 mg | ORAL_TABLET | Freq: Three times a day (TID) | ORAL | 2 refills | Status: DC
Start: 2021-09-24 — End: 2021-11-05
  Filled 2021-09-24: qty 90, 30d supply, fill #0

## 2021-09-25 ENCOUNTER — Other Ambulatory Visit (HOSPITAL_COMMUNITY): Payer: Self-pay

## 2021-09-26 ENCOUNTER — Other Ambulatory Visit (HOSPITAL_COMMUNITY): Payer: Self-pay

## 2021-09-26 MED ORDER — NORTRIPTYLINE HCL 50 MG PO CAPS
50.0000 mg | ORAL_CAPSULE | Freq: Every evening | ORAL | 5 refills | Status: DC
  Filled 2021-09-26: qty 30, 30d supply, fill #0
  Filled 2021-10-28: qty 30, 30d supply, fill #1
  Filled 2021-11-28: qty 30, 30d supply, fill #2
  Filled 2021-11-29: qty 30, 30d supply, fill #3
  Filled 2022-01-22: qty 30, 30d supply, fill #2

## 2021-10-01 ENCOUNTER — Telehealth: Payer: 59 | Admitting: Physician Assistant

## 2021-10-01 DIAGNOSIS — J019 Acute sinusitis, unspecified: Secondary | ICD-10-CM

## 2021-10-02 ENCOUNTER — Other Ambulatory Visit: Payer: Self-pay

## 2021-10-02 MED ORDER — AMOXICILLIN-POT CLAVULANATE 875-125 MG PO TABS
1.0000 | ORAL_TABLET | Freq: Two times a day (BID) | ORAL | 0 refills | Status: AC
  Filled 2021-10-02: qty 14, 7d supply, fill #0

## 2021-10-02 MED ORDER — FLUCONAZOLE 150 MG PO TABS
150.0000 mg | ORAL_TABLET | Freq: Once | ORAL | 0 refills | Status: AC
  Filled 2021-10-02: qty 1, 1d supply, fill #0

## 2021-10-02 NOTE — Addendum Note (Signed)
Addended by: Mar Daring on: 10/02/2021 08:24 AM ? ? Modules accepted: Orders ? ?

## 2021-10-02 NOTE — Progress Notes (Signed)

## 2021-10-04 ENCOUNTER — Other Ambulatory Visit (HOSPITAL_COMMUNITY): Payer: Self-pay

## 2021-10-04 ENCOUNTER — Other Ambulatory Visit: Payer: Self-pay

## 2021-10-08 ENCOUNTER — Other Ambulatory Visit: Payer: Self-pay

## 2021-10-16 ENCOUNTER — Encounter: Payer: Self-pay | Admitting: Neurology

## 2021-10-17 ENCOUNTER — Other Ambulatory Visit (HOSPITAL_COMMUNITY): Payer: Self-pay

## 2021-10-21 ENCOUNTER — Other Ambulatory Visit: Payer: Self-pay

## 2021-10-21 MED ORDER — MELOXICAM 15 MG PO TABS
ORAL_TABLET | ORAL | 2 refills | Status: DC
  Filled 2021-10-22: qty 30, 30d supply, fill #0
  Filled 2022-01-22: qty 30, 30d supply, fill #1

## 2021-10-22 ENCOUNTER — Other Ambulatory Visit: Payer: Self-pay

## 2021-10-23 ENCOUNTER — Other Ambulatory Visit: Payer: Self-pay

## 2021-10-25 ENCOUNTER — Other Ambulatory Visit: Payer: Self-pay

## 2021-10-25 MED ORDER — VITAMIN D (ERGOCALCIFEROL) 1.25 MG (50000 UNIT) PO CAPS
ORAL_CAPSULE | ORAL | 0 refills | Status: DC
  Filled 2021-10-25: qty 12, 84d supply, fill #0

## 2021-10-28 ENCOUNTER — Other Ambulatory Visit: Payer: Self-pay

## 2021-11-05 ENCOUNTER — Other Ambulatory Visit: Payer: Self-pay

## 2021-11-05 MED ORDER — ALPRAZOLAM 1 MG PO TABS
1.0000 mg | ORAL_TABLET | Freq: Three times a day (TID) | ORAL | 1 refills | Status: DC
  Filled 2021-11-05: qty 90, 30d supply, fill #0
  Filled 2021-12-11: qty 90, 30d supply, fill #1

## 2021-11-05 MED ORDER — ALPRAZOLAM 1 MG PO TABS
ORAL_TABLET | ORAL | 2 refills | Status: DC
  Filled 2022-01-22: qty 90, 30d supply, fill #0
  Filled 2022-03-04: qty 90, 30d supply, fill #1
  Filled 2022-04-22: qty 90, 30d supply, fill #2

## 2021-11-06 ENCOUNTER — Other Ambulatory Visit: Payer: Self-pay

## 2021-11-11 ENCOUNTER — Other Ambulatory Visit: Payer: Self-pay

## 2021-11-11 MED ORDER — SERTRALINE HCL 100 MG PO TABS
100.0000 mg | ORAL_TABLET | Freq: Every morning | ORAL | 5 refills | Status: DC
  Filled 2021-11-11: qty 30, 30d supply, fill #0
  Filled 2022-01-22: qty 30, 30d supply, fill #1
  Filled 2022-02-28: qty 30, 30d supply, fill #2

## 2021-11-12 ENCOUNTER — Other Ambulatory Visit: Payer: Self-pay

## 2021-11-12 MED ORDER — SERTRALINE HCL 100 MG PO TABS
ORAL_TABLET | ORAL | 5 refills | Status: DC
Start: 2021-11-12 — End: 2023-01-21
  Filled 2021-11-12 – 2021-12-11 (×2): qty 30, 30d supply, fill #0
  Filled 2022-01-06: qty 30, 30d supply, fill #1
  Filled 2022-02-04 – 2022-04-03 (×2): qty 30, 30d supply, fill #2
  Filled 2022-05-06: qty 30, 30d supply, fill #3
  Filled 2022-05-28: qty 30, 30d supply, fill #4
  Filled 2022-06-23 – 2022-08-18 (×2): qty 30, 30d supply, fill #5

## 2021-11-20 ENCOUNTER — Ambulatory Visit: Payer: 59 | Admitting: Neurology

## 2021-11-21 ENCOUNTER — Other Ambulatory Visit: Payer: Self-pay

## 2021-11-25 ENCOUNTER — Other Ambulatory Visit: Payer: 59

## 2021-11-26 ENCOUNTER — Other Ambulatory Visit: Payer: Self-pay

## 2021-11-29 ENCOUNTER — Other Ambulatory Visit (HOSPITAL_COMMUNITY): Payer: Self-pay

## 2021-11-29 ENCOUNTER — Other Ambulatory Visit: Payer: Self-pay

## 2021-11-29 MED ORDER — LEVOCETIRIZINE DIHYDROCHLORIDE 5 MG PO TABS
5.0000 mg | ORAL_TABLET | Freq: Every day | ORAL | 3 refills | Status: DC
  Filled 2021-11-29: qty 90, 90d supply, fill #0
  Filled 2022-02-28: qty 90, 90d supply, fill #1
  Filled 2022-05-23: qty 90, 90d supply, fill #2
  Filled 2022-08-18: qty 90, 90d supply, fill #3

## 2021-12-02 ENCOUNTER — Other Ambulatory Visit: Payer: Self-pay

## 2021-12-04 ENCOUNTER — Other Ambulatory Visit: Payer: Self-pay

## 2021-12-04 MED ORDER — METHYLPREDNISOLONE 4 MG PO TBPK
ORAL_TABLET | ORAL | 0 refills | Status: DC
  Filled 2021-12-04: qty 21, 6d supply, fill #0

## 2021-12-04 MED ORDER — AMOXICILLIN-POT CLAVULANATE 875-125 MG PO TABS
ORAL_TABLET | ORAL | 0 refills | Status: DC
  Filled 2021-12-04: qty 20, 10d supply, fill #0

## 2021-12-05 ENCOUNTER — Other Ambulatory Visit: Payer: Self-pay

## 2021-12-11 ENCOUNTER — Other Ambulatory Visit: Payer: Self-pay

## 2021-12-11 MED ORDER — WEGOVY 1.7 MG/0.75ML ~~LOC~~ SOAJ
SUBCUTANEOUS | 0 refills | Status: DC
  Filled 2021-12-11 – 2021-12-13 (×2): qty 3, 28d supply, fill #0
  Filled 2021-12-18: qty 3, 30d supply, fill #0
  Filled 2021-12-26 (×3): qty 3, 28d supply, fill #0

## 2021-12-11 MED ORDER — WEGOVY 1.7 MG/0.75ML ~~LOC~~ SOAJ
SUBCUTANEOUS | 0 refills | Status: DC
  Filled 2021-12-11 – 2022-01-22 (×2): qty 3, 28d supply, fill #0

## 2021-12-13 ENCOUNTER — Other Ambulatory Visit: Payer: Self-pay

## 2021-12-17 ENCOUNTER — Other Ambulatory Visit: Payer: Self-pay

## 2021-12-18 ENCOUNTER — Other Ambulatory Visit: Payer: Self-pay

## 2021-12-18 MED ORDER — CIPROFLOXACIN HCL 500 MG PO TABS
ORAL_TABLET | ORAL | 0 refills | Status: DC
  Filled 2021-12-18: qty 20, 10d supply, fill #0

## 2021-12-18 MED ORDER — PHENAZOPYRIDINE HCL 200 MG PO TABS
ORAL_TABLET | ORAL | 0 refills | Status: DC
  Filled 2021-12-18: qty 12, 4d supply, fill #0

## 2021-12-26 ENCOUNTER — Other Ambulatory Visit: Payer: Self-pay

## 2021-12-28 ENCOUNTER — Other Ambulatory Visit (HOSPITAL_COMMUNITY): Payer: Self-pay

## 2022-01-06 ENCOUNTER — Other Ambulatory Visit: Payer: Self-pay

## 2022-01-10 ENCOUNTER — Other Ambulatory Visit: Payer: Self-pay

## 2022-01-14 ENCOUNTER — Other Ambulatory Visit: Payer: Self-pay

## 2022-01-22 ENCOUNTER — Other Ambulatory Visit: Payer: Self-pay

## 2022-01-23 ENCOUNTER — Other Ambulatory Visit: Payer: Self-pay

## 2022-01-24 ENCOUNTER — Other Ambulatory Visit: Payer: Self-pay

## 2022-01-27 ENCOUNTER — Other Ambulatory Visit: Payer: Self-pay

## 2022-01-30 ENCOUNTER — Other Ambulatory Visit: Payer: Self-pay

## 2022-02-04 ENCOUNTER — Other Ambulatory Visit: Payer: Self-pay

## 2022-02-07 ENCOUNTER — Emergency Department
Admission: EM | Admit: 2022-02-07 | Discharge: 2022-02-07 | Disposition: A | Discharge status: Home / Self Care | Payer: 59 | Attending: Emergency Medicine | Admitting: Emergency Medicine

## 2022-02-07 ENCOUNTER — Emergency Department: Payer: 59

## 2022-02-07 ENCOUNTER — Other Ambulatory Visit: Payer: Self-pay

## 2022-02-07 DIAGNOSIS — R200 Anesthesia of skin: Secondary | ICD-10-CM | POA: Insufficient documentation

## 2022-02-07 DIAGNOSIS — R519 Headache, unspecified: Secondary | ICD-10-CM | POA: Diagnosis not present

## 2022-02-07 LAB — CBC WITH DIFFERENTIAL/PLATELET
Abs Immature Granulocytes: 0.01 10*3/uL (ref 0.00–0.07)
Basophils Absolute: 0 10*3/uL (ref 0.0–0.1)
Basophils Relative: 1 %
Eosinophils Absolute: 0.1 10*3/uL (ref 0.0–0.5)
Eosinophils Relative: 2 %
HCT: 37 % (ref 36.0–46.0)
Hemoglobin: 12.2 g/dL (ref 12.0–15.0)
Immature Granulocytes: 0 %
Lymphocytes Relative: 35 %
Lymphs Abs: 1.9 10*3/uL (ref 0.7–4.0)
MCH: 26.4 pg (ref 26.0–34.0)
MCHC: 33 g/dL (ref 30.0–36.0)
MCV: 80.1 fL (ref 80.0–100.0)
Monocytes Absolute: 0.2 10*3/uL (ref 0.1–1.0)
Monocytes Relative: 4 %
Neutro Abs: 3.2 10*3/uL (ref 1.7–7.7)
Neutrophils Relative %: 58 %
Platelets: 193 10*3/uL (ref 150–400)
RBC: 4.62 MIL/uL (ref 3.87–5.11)
RDW: 12.9 % (ref 11.5–15.5)
WBC: 5.4 10*3/uL (ref 4.0–10.5)
nRBC: 0 % (ref 0.0–0.2)

## 2022-02-07 LAB — BASIC METABOLIC PANEL
Anion gap: 3 — ABNORMAL LOW (ref 5–15)
BUN: 10 mg/dL (ref 6–20)
CO2: 28 mmol/L (ref 22–32)
Calcium: 8.7 mg/dL — ABNORMAL LOW (ref 8.9–10.3)
Chloride: 109 mmol/L (ref 98–111)
Creatinine, Ser: 0.72 mg/dL (ref 0.44–1.00)
GFR, Estimated: >60 mL/min (ref >60)
Glucose, Bld: 91 mg/dL (ref 70–99)
Potassium: 3.7 mmol/L (ref 3.5–5.1)
Sodium: 140 mmol/L (ref 135–145)

## 2022-02-07 LAB — PROTIME-INR
INR: 1.1 (ref 0.8–1.2)
Prothrombin Time: 14.1 seconds (ref 11.4–15.2)

## 2022-02-07 MED ORDER — SODIUM CHLORIDE 0.9 % IV BOLUS
1000.0000 mL | Freq: Once | INTRAVENOUS | Status: AC
  Administered 2022-02-07: 1000 mL via INTRAVENOUS

## 2022-02-07 MED ORDER — MAGNESIUM SULFATE 2 GM/50ML IV SOLN
2.0000 g | Freq: Once | INTRAVENOUS | Status: AC
  Administered 2022-02-07: 2 g via INTRAVENOUS
  Filled 2022-02-07: qty 50

## 2022-02-07 MED ORDER — DIPHENHYDRAMINE HCL 50 MG/ML IJ SOLN
25.0000 mg | Freq: Once | INTRAMUSCULAR | Status: AC
  Administered 2022-02-07: 25 mg via INTRAVENOUS
  Filled 2022-02-07: qty 1

## 2022-02-07 MED ORDER — KETOROLAC TROMETHAMINE 15 MG/ML IJ SOLN
15.0000 mg | Freq: Once | INTRAMUSCULAR | Status: AC
  Administered 2022-02-07: 15 mg via INTRAVENOUS
  Filled 2022-02-07: qty 1

## 2022-02-07 MED ORDER — METOCLOPRAMIDE HCL 5 MG/ML IJ SOLN
10.0000 mg | Freq: Once | INTRAMUSCULAR | Status: AC
  Administered 2022-02-07: 10 mg via INTRAVENOUS
  Filled 2022-02-07: qty 2

## 2022-02-07 NOTE — ED Notes (Signed)
Patient still needs a UA and was given a cup and directions to the bathroom.

## 2022-02-07 NOTE — ED Provider Triage Note (Signed)
Emergency Medicine Provider Triage Evaluation Note  Julie [name] , a 46 y.o. female  was evaluated in triage.  Pt complains of headache with referral from the base of the neck to the top of the head. It is different from her typical migraine. It has been intermittent since onset last week. This morning on the way to work, she noted tingling in her hand. She denies NV, vision change, weakness, paresthesias, slurred speech, or facial droop.   Review of Systems  Positive: headache Negative: Vision change  Physical Exam  BP 134/71 (BP Location: Left Arm)   Pulse 85   Temp 97.8 F (36.6 C) (Oral)   Resp 17   Ht '5\' 7"'$  (1.702 m)   Wt 136.1 kg   LMP 09/26/2015 (Exact Date)   SpO2 100%   BMI 46.99 kg/m  Gen:   Awake, no distress  NAD Resp:  Normal effort CTA MSK:   Moves extremities without difficulty Normal composite fists Other:  CN II-XII grossly intact   Medical Decision Making  Medically screening exam initiated at 2:32 PM.  Appropriate orders placed.  Earline Mayotte was informed that the remainder of the evaluation will be completed by another provider, this initial triage assessment does not replace that evaluation, and the importance of remaining in the ED until their evaluation is complete.  Patient to the ED for evaluation of posterior headache and hand paresthesias.    Melvenia Needles, PA-C 02/07/22 1435

## 2022-02-07 NOTE — ED Provider Notes (Signed)
[facility] Provider Note    Event Date/Time   First MD Initiated Contact with Patient 02/07/22 1742     (approximate)   History   Headache   HPI  Julie [name] is a 46 y.o. female   Past medical history of migraine headaches, anxiety, depression, history of hysterectomy presents with headache for the past 4 days, not relieved with Excedrin as her normal migraine headaches usually are.  Started out bilateral frontal, consistent with migraines in the past, no head trauma no thinners.  Lasting longer than usual, now encompassing the posterior of her head as well.  Had some occasional numbness to her hands first on the left and then on the right while she was driving.  Those have resolved now.  No motor deficits.  No fever, no neck stiffness, no nausea or vomiting, no vision changes, no other medical complaints.   History was obtained via patient and review of external medical notes.      Physical Exam   Triage Vital Signs: ED Triage Vitals  Enc Vitals Group     BP 02/07/22 1422 134/71     Pulse Rate 02/07/22 1422 85     Resp 02/07/22 1422 17     Temp 02/07/22 1422 97.8 F (36.6 C)     Temp Source 02/07/22 1422 Oral     SpO2 02/07/22 1422 100 %     Weight 02/07/22 1423 300 lb (136.1 kg)     Height 02/07/22 1423 '5\' 7"'$  (1.702 m)     Head Circumference --      Peak Flow --      Pain Score 02/07/22 1423 7     Pain Loc --      Pain Edu? --      Excl. in [place]? --     Most recent vital signs: Vitals:   02/07/22 1422  BP: 134/71  Pulse: 85  Resp: 17  Temp: 97.8 F (36.6 C)  SpO2: 100%    General: Awake, no distress.  CV:  Good peripheral perfusion.  Resp:  Normal effort.  Abd:  No distention.  Other:  Focal neurological deficits, including no motor or sensory deficits, steady and normal gait, normal finger-to-nose, extraocular movements intact, pupils equal round and reactive, neck is supple with full range of motion, no fever nontoxic  appearance. No temporal tenderness.    ED Results / Procedures / Treatments   Labs (all labs ordered are listed, but only abnormal results are displayed) Labs Reviewed  BASIC METABOLIC PANEL - Abnormal; Notable for the following components:      Result Value   Calcium 8.7 (*)    Anion gap 3 (*)    All other components within normal limits  CBC WITH DIFFERENTIAL/PLATELET  PROTIME-INR     I reviewed labs and they are notable for normal H&H and no leukocytosis    RADIOLOGY I independently reviewed and interpreted CT of the head without contrast and see no obvious hemorrhage or midline shift   PROCEDURES:  Critical Care performed: No  Procedures   MEDICATIONS ORDERED IN ED: Medications  magnesium sulfate IVPB 2 g 50 mL (has no administration in time range)  sodium chloride 0.9 % bolus 1,000 mL (has no administration in time range)  metoCLOPramide (REGLAN) injection 10 mg (has no administration in time range)  ketorolac (TORADOL) 15 MG/ML injection 15 mg (has no administration in time range)     IMPRESSION / MDM / ASSESSMENT AND PLAN /  ED COURSE  I reviewed the triage vital signs and the nursing notes.                              Differential diagnosis includes, but is not limited to, migraine headache, complex migraine headache, considered though less likely intracranial bleeding, infection, CVA.   MDM: Patient with migraine headache, consistent with prior, though longer in duration despite at home treatment.  No focal neurological deficits or trauma or lab derangements to suggest more  sinister etiologies as above in differential diagnosis.  Given increase in severity and duration, CT scan of the head was performed to assess for intracranial bleeding, and is negative.  I doubt subarachnoid hemorrhage and defer lumbar puncture after weighing risks and benefits, at this time given gradual onset and not severe; more consistent with migraine headaches experienced in the  past.  Will trial migraine cocktail in the emergency department and reassess.  Signed out pending meds and reassessment.    Patient's presentation is most consistent with acute presentation with potential threat to life or bodily function.       FINAL CLINICAL IMPRESSION(S) / ED DIAGNOSES   Final diagnoses:  Nonintractable headache, unspecified chronicity pattern, unspecified headache type     Rx / DC Orders   ED Discharge Orders     None        Note:  This document was prepared using Dragon voice recognition software and may include unintentional dictation errors.    Lucillie Garfinkel, MD 02/07/22 1919

## 2022-02-07 NOTE — Discharge Instructions (Addendum)
Thank you for choosing [place] for your health care today!  Take acetaminophen 650 mg and ibuprofen 400 mg every 6 hours for pain.  Take with food.  Please see your primary doctor this week for a follow up appointment.   Sometimes, in the early stages of certain disease courses it is difficult to detect in the emergency department evaluation -- so, it is important that you continue to monitor your symptoms and call your doctor right away or return to the emergency department if you develop any new or worsening symptoms.  It was my pleasure to care for you today.   Hoover Brunette Jacelyn Grip, MD

## 2022-02-07 NOTE — ED Triage Notes (Signed)
Pt has had a headache that started a week ago. Last night the headache changed and was at the base of her neck going over her head. Pt states this is not like her normal migraines. Pt noticed that her hands were numb this morning when she was driving to work. Jenise, PA in room

## 2022-02-07 NOTE — ED Provider Notes (Signed)
  Physical Exam  BP 134/71 (BP Location: Left Arm)   Pulse 85   Temp 97.8 F (36.6 C) (Oral)   Resp 17   Ht '5\' 7"'$  (1.702 m)   Wt 136.1 kg   LMP 09/26/2015 (Exact Date)   SpO2 100%   BMI 46.99 kg/m   Physical Exam  Procedures  Procedures  ED Course / MDM    Medical Decision Making Risk Prescription drug management.   Assumed patient care from Cottie Banda, MD.  Patient felt much improved after migraine cocktail and requested discharge.       Vallarie Mare [place], Hershal Coria 02/07/22 2122    Lucillie Garfinkel, MD 02/08/22 [phone]

## 2022-02-12 ENCOUNTER — Other Ambulatory Visit: Payer: Self-pay

## 2022-02-12 NOTE — Progress Notes (Signed)
NEUROLOGY FOLLOW UP OFFICE NOTE  BEYONCA [name] [id number]  Assessment/Plan:   1  Chronic migraine without aura, without status migrainosus, intractable 2  Bilateral carpal tunnel syndrome   As headaches worsening, will check MRI of brain with and without contrast Discontinue Aimovig and nortriptyline.  Plan to start Emgality She will try Zavzpret NS for rescue  Limit use of pain relievers to no more than 2 days out of week to prevent risk of rebound or medication-overuse headache. Keep headache diary Will fill out FMLA Advised to wear wrist splints.   Follow up 4 months.  Subjective:  Julie [name] is a 46 year old female with depression, anxiety and kidney stone who follows up for migraine.   UPDATE: Last visit, increased nortriptyline to '50mg'$   .  She tried Reyvow which was ineffective.  Migraines did improve but they became more frequent over the past 2 months.  They would occur once or twice a month, right before the Aimovig injection.  Aborts with Advil.  Since 2 months ago, they are occurring once a week and will last 4-5 days.  Seen in the ED at [facility] on 9/1.  CT head was unremarkable.  Treated with headache cocktail.    Also, for the past 2 1/2 months, hands go numb with use, such as while driving.  Worse in morning and at night.     Current NSAIDS: None Current analgesics: Excedrin Migraine (rarely, ineffective) Current triptans: None Current ergotamine: None Current anti-emetic: None Current muscle relaxants: none Current anti-anxiolytic: Alprazolam 1 mg as needed Current sleep aide: None Current Antihypertensive medications: None Current Antidepressant medications: Nortriptyline '50mg'$  QHS, sertraline 100 mg daily Current Anticonvulsant medications: none Current anti-CGRP: Aimovig '140mg'$  monthly Current Vitamins/Herbal/Supplements: None Current Antihistamines/Decongestants: None Other therapy: Massage therapy, Daith piercing bilaterally,  chiropractor Hormone/birth control: progesterone   Caffeine: One cup of coffee daily, tea at dinner Alcohol: Occasional Smoker: No Diet: 4 glasses of water daily; does not eat breakfast. Exercise: Not routine Depression: Yes; Anxiety: Yes Other pain: Diffuse body aches Sleep hygiene: Sleeps but not rested.  Sleep study negative for OSA.   HISTORY: Onset: She developed migraines in high school but resolved.  Migraines returned at age 46 years old after birth of her son. Location:  Back of head bilaterally, top of head, radiating down neck into shoulders Quality:  Vice-grip Initial intensity:  5-10/10 (usually 10/10).  She denies new headache, thunderclap headache Aura:  Sometimes phantosmia (chemicals, exhaust, burning) Prodrome:  no Postdrome:  fatigue Associated symptoms: Nausea, photophobia, phonophobia, blurred vision.  She denies associated unilateral numbness or weakness. Initial duration:  Constant. Often wakes up with it. Initial Frequency:  daily Initial Frequency of abortive medication: none Triggers: None Relieving factors:  Applying pressure to upper paraspinal region, resting in dark and quiet place Activity:  aggravates   Workup included MRI of brain without contrast from 10//19/16, which was personally reviewed and was unremarkable.  Endorsed neck pain with bilateral upper extremity pain, numbness and weakness.  Started on gabapentin which was ineffective and was subsequently switched to nortriptyline.  MRI of cervical spine on 06/19/2020 revealed mild multilevel degenerative changes with right paracentral disc protrusion at C3-4 abutting the ventral cord and mild right neural foraminal narrowing at C3-4 and C4-5 but no significant spinal canal stenosis.   Past NSAIDS:  Ibuprofen, naproxen, indomethacin '50mg'$  three times daily, Toradol shot (helps) Past analgesics:  Fioricet with codeine, Excedrin, Tylenol Past abortive triptans:  Sumatriptan '100mg'$  (made her sick),  rizatriptan '10mg'$ , eletritan  $'40mg'B$  Past abortive ergotamine:  Cafergot (caused throat swelling) Past muscle relaxants:  Flexeril, tizanidine '4mg'$  QHS Past anti-emetic:  Zofran (sometimes helps) Past antihypertensive medications:  Propranolol '80mg'$  twice daily Past antidepressant medications:  Amitriptyline '25mg'$  at bedtime, Wellbutrin Past anticonvulsant medications:  topiramate '150mg'$ , zonisamide '25mg'$  at bedtime, gabapentin '400mg'$  QHS Past anti-CGRP:  Nurtec, Ubrelvy Past vitamins/Herbal/Supplements:  none Past antihistamines/decongestants:  none Other past therapies:  Trigger point/nerve blocks, cognitive behavioral therapy, acupuncture, biofeedback, Botox (1 round, stopped due to cost related to change in insurance), Reyvow   Family history of headache:  No   PAST MEDICAL HISTORY: Past Medical History:  Diagnosis Date   Anemia    Anemia    Anxiety    Breast discharge 07/01/2013   Pt noticed discharge, none on exam today will check TSH and Prolactin   Breast mass, right 10/20/2012   Depression    Dysmenorrhea 09/19/2015   Dyspareunia in female 09/19/2015   Ectopic pregnancy    Eczema    GERD (gastroesophageal reflux disease)    no meds   Headache    Hernia of abdominal wall 01/04/2013   Kidney stone    Left breast mass 04/12/2014   Has pea sized nodule at 6 o'clock tender and mobile, will get mammogram and [place] if needed   Menorrhagia with regular cycle 09/19/2015   Pelvic pain in female 09/19/2015   Placenta previa 2012   Current pregnancy    Urinary tract infection    Vaginal discharge 07/01/2013    MEDICATIONS: Current Outpatient Medications on File Prior to Visit  Medication Sig Dispense Refill   ALPRAZolam (XANAX) 1 MG tablet Take 1 tablet (1 mg total) by mouth 3 (three) times daily. 90 tablet 1   ALPRAZolam (XANAX) 1 MG tablet Take 1 tablet (1 mg total) by mouth 3 (three) times daily. 90 tablet 2   Ascorbic Acid (VITAMIN C) 1000 MG tablet Take 1,000 mg by mouth daily.      buPROPion (WELLBUTRIN XL) 150 MG 24 hr tablet Take 1 tablet (150 mg total) by mouth daily. 90 tablet 3   furosemide (LASIX) 40 MG tablet Take 40 mg by mouth as needed.      levocetirizine (XYZAL) 5 MG tablet Take 1 tablet (5 mg total) by mouth daily. 90 tablet 3   omeprazole (PRILOSEC) 20 MG capsule Take 1 capsule (20 mg total) by mouth 2 (two) times daily. 180 capsule 2   oxybutynin (DITROPAN) 5 MG tablet Take 1 tablet (5 mg total) by mouth daily. 30 tablet 10   Potassium 99 MG TABS Take by mouth.     sertraline (ZOLOFT) 100 MG tablet Take 1 tablet (100 mg total) by mouth every morning. 30 tablet 5   sertraline (ZOLOFT) 50 MG tablet Take 1 tablet (50 mg total) by mouth daily. Take with 100 mg tablet 90 tablet 4   Vitamin D, Ergocalciferol, (DRISDOL) 1.25 MG (50000 UNIT) CAPS capsule 1 Capsule(s) once a week then 2000 units otc daily 12 capsule 0   celecoxib (CELEBREX) 200 MG capsule Take 1 capsule (200 mg total) by mouth 2 (two) times daily. (Patient not taking: Reported on 02/13/2022) 60 capsule 3   meloxicam (MOBIC) 15 MG tablet Take 1 tablet every day by oral route. (Patient not taking: Reported on 02/13/2022) 30 tablet 2   phenazopyridine (PYRIDIUM) 200 MG tablet 1 Tablet(s) 3 times daily Oral (Patient not taking: Reported on 02/13/2022) 12 tablet 0   Semaglutide-Weight Management (WEGOVY) 0.25 MG/0.5ML SOAJ Inject 0.25 mg  into the skin once a week. (Patient not taking: Reported on 02/13/2022) 2 mL 2   Semaglutide-Weight Management (WEGOVY) 1.7 MG/0.75ML SOAJ Inject weekly (Patient not taking: Reported on 02/13/2022) 3 mL 0   sertraline (ZOLOFT) 100 MG tablet Take 1 tablet (100 mg total) by mouth every morning. (Patient not taking: Reported on 02/13/2022) 30 tablet 5   No current facility-administered medications on file prior to visit.     ALLERGIES: Allergies  Allergen Reactions   Biaxin [Clarithromycin]    Clarithromycin Other (See Comments)    Makes throat raw   Topamax [Topiramate] Itching    Tramadol     FAMILY HISTORY: Family History  Problem Relation Age of Onset   Depression Mother    Cancer Mother    Seizures Mother    Bipolar disorder Mother    Asthma Mother    Atrial fibrillation Mother    Hypertension Father    Asthma Father    Heart disease Maternal Grandmother    Cancer Maternal Grandmother        oral; lymph nodes   Hypertension Maternal Grandmother    Other Maternal Grandmother        memory problems   Stroke Paternal Grandfather    Hypertension Paternal Grandfather    Other Paternal Grandfather        brain tumors   Heart disease Maternal Grandfather    Pancreatitis Maternal Grandfather    Cancer Paternal Grandmother        breast   Heart disease Paternal Grandmother    Kidney disease Paternal Grandmother        kidney failure   Diabetes Paternal Grandmother    Breast cancer Paternal Grandmother    Urticaria Son    Allergic rhinitis Neg Hx    Angioedema Neg Hx    Eczema Neg Hx    Immunodeficiency Neg Hx       Objective:  Blood pressure (!) 136/91, pulse 97, height '5\' 7"'$  (1.702 m), weight (!) 308 lb (139.7 kg), last menstrual period 09/26/2015, SpO2 100 %. General: No acute distress.  Patient appears well-groomed.      Metta Clines, DO  CC: Sharilyn Sites, MD

## 2022-02-13 ENCOUNTER — Encounter: Payer: Self-pay | Admitting: Neurology

## 2022-02-13 ENCOUNTER — Other Ambulatory Visit: Payer: Self-pay

## 2022-02-13 ENCOUNTER — Ambulatory Visit: Payer: 59 | Admitting: Neurology

## 2022-02-13 ENCOUNTER — Telehealth (HOSPITAL_COMMUNITY): Payer: Self-pay | Admitting: Pharmacy Technician

## 2022-02-13 VITALS — BP 136/91 | HR 97 | Ht 67.0 in | Wt 308.0 lb

## 2022-02-13 DIAGNOSIS — G43719 Chronic migraine without aura, intractable, without status migrainosus: Secondary | ICD-10-CM | POA: Diagnosis not present

## 2022-02-13 DIAGNOSIS — G5603 Carpal tunnel syndrome, bilateral upper limbs: Secondary | ICD-10-CM | POA: Diagnosis not present

## 2022-02-13 DIAGNOSIS — R519 Headache, unspecified: Secondary | ICD-10-CM | POA: Diagnosis not present

## 2022-02-13 DIAGNOSIS — Z0279 Encounter for issue of other medical certificate: Secondary | ICD-10-CM

## 2022-02-13 MED ORDER — EMGALITY 120 MG/ML ~~LOC~~ SOAJ
240.0000 mg | Freq: Once | SUBCUTANEOUS | 0 refills | Status: AC
  Filled 2022-02-13 – 2022-02-14 (×2): qty 2, 28d supply, fill #0

## 2022-02-13 MED ORDER — EMGALITY 120 MG/ML ~~LOC~~ SOAJ
120.0000 mg | SUBCUTANEOUS | 5 refills | Status: DC
  Filled 2022-02-13: qty 1.12, fill #0
  Filled 2022-03-13 (×2): qty 1, 28d supply, fill #0
  Filled 2022-03-14: qty 1, 30d supply, fill #0
  Filled 2022-04-11: qty 1, 30d supply, fill #1
  Filled 2022-05-07: qty 1, 30d supply, fill #2
  Filled 2022-06-05: qty 1, 30d supply, fill #3

## 2022-02-13 MED ORDER — NORTRIPTYLINE HCL 50 MG PO CAPS
100.0000 mg | ORAL_CAPSULE | Freq: Every day | ORAL | 5 refills | Status: DC
  Filled 2022-02-13: qty 60, 30d supply, fill #0

## 2022-02-13 NOTE — Telephone Encounter (Signed)
Patient Advocate Encounter   Received notification that prior authorization for Emgality '120MG'$ /ML auto-injectors (migraine) is required.   PA submitted on 02/13/2022 Key [id number] Status is pending       Lyndel Safe, [place] Patient Advocate Specialist [place] Patient Advocate Team Direct Number: [phone]  Fax: [phone]

## 2022-02-13 NOTE — Progress Notes (Signed)
Medication Samples have been provided to the patient.  Drug name: Zavzpret       Strength: 10 mg        Qty: 2  LOT: [id number]  Exp.Date: 07/2023  Dosing instructions: as needed  The patient has been instructed regarding the correct time, dose, and frequency of taking this medication, including desired effects and most common side effects.   Venetia Night 11:47 AM 02/13/2022

## 2022-02-13 NOTE — Patient Instructions (Signed)
Stop aimovig and nortriptyline Plan to start emgality injection - 2 injections for first dose, then 1 injection every 28 days thereafter Take Zavzpret spray, one spray in one nostril for acute migraine.  Maximum 1 spray in 24 hours.  Let me know if effective Wear wrist splints at night and as much during day as possible MRI of brain with and without contrast Follow up in 4 months.

## 2022-02-14 ENCOUNTER — Other Ambulatory Visit: Payer: Self-pay

## 2022-02-18 ENCOUNTER — Other Ambulatory Visit: Payer: Self-pay

## 2022-02-19 ENCOUNTER — Other Ambulatory Visit: Payer: Self-pay

## 2022-02-19 MED ORDER — WEGOVY 0.25 MG/0.5ML ~~LOC~~ SOAJ: SUBCUTANEOUS | 2 refills | Status: DC

## 2022-02-20 ENCOUNTER — Encounter: Payer: Self-pay | Admitting: Neurology

## 2022-02-21 ENCOUNTER — Other Ambulatory Visit: Payer: Self-pay

## 2022-02-25 ENCOUNTER — Other Ambulatory Visit: Payer: Self-pay

## 2022-02-28 ENCOUNTER — Other Ambulatory Visit: Payer: Self-pay

## 2022-03-02 ENCOUNTER — Other Ambulatory Visit: Payer: 59

## 2022-03-03 ENCOUNTER — Ambulatory Visit
Admission: RE | Admit: 2022-03-03 | Discharge: 2022-03-03 | Disposition: A | Discharge status: Home / Self Care | Payer: 59 | Source: Ambulatory Visit | Attending: Neurology | Admitting: Neurology

## 2022-03-03 DIAGNOSIS — R519 Headache, unspecified: Secondary | ICD-10-CM

## 2022-03-03 MED ORDER — GADOBENATE DIMEGLUMINE 529 MG/ML IV SOLN
20.0000 mL | Freq: Once | INTRAVENOUS | Status: AC | PRN
  Administered 2022-03-03: 20 mL via INTRAVENOUS

## 2022-03-04 ENCOUNTER — Other Ambulatory Visit: Payer: Self-pay

## 2022-03-05 ENCOUNTER — Other Ambulatory Visit: Payer: Self-pay

## 2022-03-05 ENCOUNTER — Other Ambulatory Visit (HOSPITAL_COMMUNITY): Payer: Self-pay

## 2022-03-07 ENCOUNTER — Other Ambulatory Visit: Payer: 59

## 2022-03-10 ENCOUNTER — Other Ambulatory Visit: Payer: Self-pay

## 2022-03-13 ENCOUNTER — Other Ambulatory Visit: Payer: Self-pay

## 2022-03-14 ENCOUNTER — Other Ambulatory Visit: Payer: Self-pay

## 2022-03-17 ENCOUNTER — Ambulatory Visit: Payer: 59 | Admitting: Neurology

## 2022-03-21 NOTE — Telephone Encounter (Signed)
Letter received from [facility] Emgality approved.  Effective 03/18/22-03/18/23, Approved for 12 months QTY 1 ml per 30 days

## 2022-04-03 ENCOUNTER — Other Ambulatory Visit: Payer: Self-pay

## 2022-04-03 MED ORDER — CEPHALEXIN 500 MG PO CAPS
ORAL_CAPSULE | ORAL | 0 refills | Status: DC
  Filled 2022-04-03: qty 40, 10d supply, fill #0

## 2022-04-11 ENCOUNTER — Other Ambulatory Visit: Payer: Self-pay

## 2022-04-14 ENCOUNTER — Other Ambulatory Visit: Payer: Self-pay

## 2022-04-16 ENCOUNTER — Ambulatory Visit: Payer: 59 | Admitting: Podiatry

## 2022-04-16 ENCOUNTER — Encounter: Payer: Self-pay | Admitting: Podiatry

## 2022-04-16 DIAGNOSIS — S86311D Strain of muscle(s) and tendon(s) of peroneal muscle group at lower leg level, right leg, subsequent encounter: Secondary | ICD-10-CM

## 2022-04-16 NOTE — Progress Notes (Signed)
She presents today for follow-up of her peroneal tendon right.  She states that she was wanting to schedule surgery but she says her foot is starting to hurt worse.  Her last MRI was January of last year and since that time she has been unable to have surgery due to insurance and family matters.  She states that is affecting her ability to perform her daily activities at this point and that right ankle is really giving way.  Objective: Vital signs are stable she is alert oriented x3 she has considerable swelling along the lateral ankle and severe pain on plantarflexion and eversion and abduction against resistance.  Radiographs reviewed do not demonstrate any type of osseous abnormalities other than mild pes planus.  Soft tissue swelling of the lateral ankle complex is noted.  Assessment: Worsening of her previous condition peroneal tendon tear.  Plan: Requesting MRI for follow-up peroneal tendon tear for surgical planning.

## 2022-04-22 ENCOUNTER — Other Ambulatory Visit: Payer: Self-pay

## 2022-04-23 ENCOUNTER — Ambulatory Visit
Admission: RE | Admit: 2022-04-23 | Discharge: 2022-04-23 | Disposition: A | Discharge status: Home / Self Care | Payer: 59 | Source: Ambulatory Visit | Attending: Podiatry | Admitting: Podiatry

## 2022-04-23 DIAGNOSIS — S86311D Strain of muscle(s) and tendon(s) of peroneal muscle group at lower leg level, right leg, subsequent encounter: Secondary | ICD-10-CM

## 2022-04-28 ENCOUNTER — Encounter: Payer: Self-pay | Admitting: Podiatry

## 2022-04-28 ENCOUNTER — Ambulatory Visit: Payer: 59 | Admitting: Podiatry

## 2022-04-28 DIAGNOSIS — S86311D Strain of muscle(s) and tendon(s) of peroneal muscle group at lower leg level, right leg, subsequent encounter: Secondary | ICD-10-CM | POA: Diagnosis not present

## 2022-04-28 NOTE — Progress Notes (Signed)
She presents today states that her right foot is just killing her.  She still having heel pain and lateral ankle pain.  She states that she is ready to get this fixed.  Objective: Vital signs are stable alert oriented x3 I reviewed her past medical history medications allergies surgery social history and review of systems.  She has severe pain on palpation to the infra malleolar region and lateral aspect of the peroneal tendons extending toward the fifth metatarsal base.  She has pain on inversion of the foot in this area.  She does not have significant pain on abduction against resistance or abduction eversion plantarflexion against resistance.  MRI does demonstrate several findings none consistent with her pain though the one in January did demonstrate findings that are consistent with her pain at this point.  Review of the MRI myself demonstrates very similar findings to previous MRI.  Assessment: Lateral ankle instability possible instability of the calcaneofibular ligament peroneal tendon tear and Planter fasciitis.  Plan: We discussed etiology pathology conservative versus surgical therapies today.  We did discuss performing a repair of the peroneal tendons evaluating the calcaneofibular ligament for possible need for repair as well.  Also an endoscopic plantar fasciotomy a PRP injection as well as cast application.  She understands she will be nonweightbearing for period of time and will be out of work for a period of time.  We did discuss possible postop complications which may include but not limited to postop pain bleeding swell infection recurrence need for further surgery overcorrection under correction also digit loss limb loss of life.

## 2022-04-29 ENCOUNTER — Other Ambulatory Visit: Payer: Self-pay

## 2022-04-29 MED ORDER — DOXYCYCLINE HYCLATE 100 MG PO TABS
100.0000 mg | ORAL_TABLET | Freq: Two times a day (BID) | ORAL | 0 refills | Status: DC
  Filled 2022-04-29: qty 20, 10d supply, fill #0

## 2022-04-30 ENCOUNTER — Telehealth: Payer: Self-pay | Admitting: Podiatry

## 2022-04-30 NOTE — Telephone Encounter (Addendum)
DOS: 05/16/2022  Dr. Sherryle Lis Assisting  [facility]   Endoscopic Plantar Fasciotomy Rt (68159) Repair Peroneal Tendon Rt (47076) Repair Calcaneal Fibular Ligament Rt (15183) PRP Injection Rt (43735) Cast Application Rt  DX: D89.7 and S86.311D  Deductible: $350 with $0 remaining Out-of-Pocket: $7,900 with $4,035.22 remaining CoInsurance: 20%  Prior Authorization is not required for any of the surgical CPT codes.  Case ID: [id number] Trans ID: [id number]

## 2022-05-06 ENCOUNTER — Other Ambulatory Visit: Payer: Self-pay

## 2022-05-07 ENCOUNTER — Other Ambulatory Visit: Payer: Self-pay

## 2022-05-14 ENCOUNTER — Other Ambulatory Visit: Payer: Self-pay | Admitting: Podiatry

## 2022-05-14 MED ORDER — OXYCODONE-ACETAMINOPHEN 10-325 MG PO TABS: 1.0000 | ORAL_TABLET | Freq: Three times a day (TID) | ORAL | 0 refills | Status: AC | PRN

## 2022-05-14 MED ORDER — ONDANSETRON HCL 4 MG PO TABS: 4.0000 mg | ORAL_TABLET | Freq: Three times a day (TID) | ORAL | 0 refills | Status: DC | PRN

## 2022-05-14 MED ORDER — CEPHALEXIN 500 MG PO CAPS: 500.0000 mg | ORAL_CAPSULE | Freq: Three times a day (TID) | ORAL | 0 refills | Status: DC

## 2022-05-16 ENCOUNTER — Telehealth: Payer: Self-pay | Admitting: Podiatry

## 2022-05-16 DIAGNOSIS — M722 Plantar fascial fibromatosis: Secondary | ICD-10-CM | POA: Diagnosis not present

## 2022-05-16 DIAGNOSIS — S86311D Strain of muscle(s) and tendon(s) of peroneal muscle group at lower leg level, right leg, subsequent encounter: Secondary | ICD-10-CM | POA: Diagnosis not present

## 2022-05-16 NOTE — Telephone Encounter (Signed)
Patient called stating that she thinks her cast may be too tight.  She is having swelling to the toes.  She she had a nerve block so she is not sure if the numbness is coming from the callus with a nerve block.  She is been icing and elevating.  She has not yet taken any pain medication.  In regards to the nerve block discussed going ahead and starting this on the nerve block wears off is not as painful.  I also discussed that if she feels the cast is too tight she can go to the emergency room to have them bivalve it tonight.  I did offer to meet her in the office to bivalve it currently but she is going to watch it she states.  Overnight there is any issues to go to the ER.  She verbalized understanding.  No further questions or concerns.

## 2022-05-21 ENCOUNTER — Ambulatory Visit (INDEPENDENT_AMBULATORY_CARE_PROVIDER_SITE_OTHER): Payer: 59 | Admitting: Podiatry

## 2022-05-21 ENCOUNTER — Encounter: Payer: Self-pay | Admitting: Podiatry

## 2022-05-21 VITALS — BP 127/75 | HR 85

## 2022-05-21 DIAGNOSIS — S86311D Strain of muscle(s) and tendon(s) of peroneal muscle group at lower leg level, right leg, subsequent encounter: Secondary | ICD-10-CM

## 2022-05-21 DIAGNOSIS — Z9889 Other specified postprocedural states: Secondary | ICD-10-CM

## 2022-05-21 NOTE — Progress Notes (Signed)
She presents today postop visit #1 date of surgery 06/05/2022 right foot peroneal tendon repair EPF possible CHL repair which was not performed and a PRP injection.  She denies fever chills nausea vomit muscle aches pains calf pain back pain chest pain shortness of breath.  States that her block lasted for 2 solid days.  Objective: Cast is intact presents with her son and knee scooter today it appears that there has been some weightbearing on the toes because it is dirty.  But she has good range of motion of the toes the sensation to the toes is completely normal she is gapping around the proximal end of the cast.  It is not too tight.  Assessment: Well-healing surgical foot leg.  Plan: Continue to use of the scooter keeping the leg elevated clean and dry follow-up with me in 1 week for cast removal.

## 2022-05-22 ENCOUNTER — Encounter: Payer: 59 | Admitting: Podiatry

## 2022-05-23 ENCOUNTER — Other Ambulatory Visit: Payer: Self-pay

## 2022-05-26 ENCOUNTER — Encounter: Payer: Self-pay | Admitting: Podiatry

## 2022-05-28 ENCOUNTER — Encounter: Payer: Self-pay | Admitting: Podiatry

## 2022-05-28 ENCOUNTER — Other Ambulatory Visit: Payer: Self-pay

## 2022-05-28 ENCOUNTER — Ambulatory Visit (INDEPENDENT_AMBULATORY_CARE_PROVIDER_SITE_OTHER): Payer: 59 | Admitting: Podiatry

## 2022-05-28 DIAGNOSIS — S86311D Strain of muscle(s) and tendon(s) of peroneal muscle group at lower leg level, right leg, subsequent encounter: Secondary | ICD-10-CM

## 2022-05-28 DIAGNOSIS — Z9889 Other specified postprocedural states: Secondary | ICD-10-CM

## 2022-05-28 NOTE — Progress Notes (Signed)
She presents today date of surgery 05/16/2022 peroneal tendon repair with an endoscopic fasciotomy.  She presents with a cast today denies fever chills nausea vomit muscle aches pains calf pain back pain chest pain shortness of breath.  She does relate some pain to the fourth toe which feels electrical in nature she says.  Objective: Cast was intact dry and clean once removed demonstrates dried sterile dressing intact though the Ace bandage had loosened.  She does have tenderness on palpation of the third interdigital space with a palpable Mulder's click.  Once the dressing was removed demonstrating the sutures in this staples they are all intact there is minimal edema no cellulitis drainage or odor no signs of infection.  Assessment: Well-healing surgical foot.  Plan: Redressed the foot today dressed a compressive dressing and applied a new below-knee cast.

## 2022-05-29 ENCOUNTER — Other Ambulatory Visit: Payer: Self-pay

## 2022-05-29 ENCOUNTER — Encounter: Payer: 59 | Admitting: Podiatry

## 2022-05-30 ENCOUNTER — Other Ambulatory Visit: Payer: Self-pay

## 2022-05-30 ENCOUNTER — Other Ambulatory Visit (HOSPITAL_COMMUNITY): Payer: Self-pay

## 2022-06-03 ENCOUNTER — Other Ambulatory Visit: Payer: Self-pay

## 2022-06-03 MED ORDER — OMEPRAZOLE 20 MG PO CPDR
20.0000 mg | DELAYED_RELEASE_CAPSULE | Freq: Two times a day (BID) | ORAL | 2 refills | Status: DC
  Filled 2022-06-03: qty 180, 90d supply, fill #0

## 2022-06-04 ENCOUNTER — Other Ambulatory Visit: Payer: Self-pay

## 2022-06-05 ENCOUNTER — Other Ambulatory Visit: Payer: Self-pay

## 2022-06-06 ENCOUNTER — Other Ambulatory Visit: Payer: Self-pay

## 2022-06-10 ENCOUNTER — Other Ambulatory Visit: Payer: Self-pay

## 2022-06-10 MED ORDER — ALPRAZOLAM 1 MG PO TABS
1.0000 mg | ORAL_TABLET | Freq: Three times a day (TID) | ORAL | 2 refills | Status: DC
  Filled 2022-06-10: qty 90, 30d supply, fill #0
  Filled 2022-07-21: qty 90, 30d supply, fill #1
  Filled 2022-09-10: qty 90, 30d supply, fill #2

## 2022-06-10 MED ORDER — BUPROPION HCL ER (XL) 150 MG PO TB24
150.0000 mg | ORAL_TABLET | Freq: Every day | ORAL | 2 refills | Status: DC
  Filled 2022-06-10 – 2022-11-20 (×2): qty 90, 90d supply, fill #0
  Filled 2023-02-18: qty 90, 90d supply, fill #1
  Filled 2023-05-27 (×2): qty 90, 90d supply, fill #2

## 2022-06-10 MED ORDER — OMEPRAZOLE 20 MG PO CPDR
20.0000 mg | DELAYED_RELEASE_CAPSULE | Freq: Two times a day (BID) | ORAL | 3 refills | Status: DC
  Filled 2022-06-10 – 2022-09-11 (×3): qty 180, 90d supply, fill #0
  Filled 2022-12-15: qty 180, 90d supply, fill #1
  Filled 2023-03-16: qty 180, 90d supply, fill #2

## 2022-06-10 MED ORDER — SERTRALINE HCL 100 MG PO TABS
100.0000 mg | ORAL_TABLET | Freq: Every morning | ORAL | 2 refills | Status: DC
  Filled 2022-06-10 – 2022-06-23 (×2): qty 90, 90d supply, fill #0
  Filled 2022-06-25: qty 30, 30d supply, fill #0
  Filled 2022-06-25: qty 60, 60d supply, fill #0
  Filled 2022-11-13: qty 90, 90d supply, fill #1

## 2022-06-10 MED ORDER — SERTRALINE HCL 50 MG PO TABS
50.0000 mg | ORAL_TABLET | Freq: Every day | ORAL | 2 refills | Status: DC
  Filled 2022-06-10 – 2022-06-25 (×3): qty 90, 90d supply, fill #0
  Filled 2022-10-02: qty 90, 90d supply, fill #1
  Filled 2023-01-04: qty 90, 90d supply, fill #2

## 2022-06-11 ENCOUNTER — Ambulatory Visit (INDEPENDENT_AMBULATORY_CARE_PROVIDER_SITE_OTHER): Payer: 59 | Admitting: Podiatry

## 2022-06-11 DIAGNOSIS — S86311D Strain of muscle(s) and tendon(s) of peroneal muscle group at lower leg level, right leg, subsequent encounter: Secondary | ICD-10-CM

## 2022-06-11 DIAGNOSIS — Z9889 Other specified postprocedural states: Secondary | ICD-10-CM

## 2022-06-11 NOTE — Progress Notes (Signed)
She presents today date of surgery 05/16/2022 right foot peroneal tendon repair endoscopic fasciotomy with a PRP injection and cast application.  She states she is doing really well the cast was loose so it started to aggravate her foot a bit.  She denies fever chills nausea right muscle aches pains calf pain back pain chest pain shortness of breath.  Objective: Vital signs stable alert oriented x 3 cast was intact dry and clean once removed demonstrates mild edema no erythema cellulitis drainage odor sutures are still intact to the EPF site.  All of the staples were removed.  Assessment: Well-healing surgical foot.  Plan: Encouraged gentle range of motion exercises dorsiflexion plantarflexion inversion eversion and circumduction.  Encouraged range of motion of the toes.  Placed her back in a cam boot she is to stay in the boot and all-time short of washing this and exercising and rehydrating the skin.  I will follow-up with her in about 2 weeks

## 2022-06-12 ENCOUNTER — Encounter: Payer: Self-pay | Admitting: Podiatry

## 2022-06-19 ENCOUNTER — Encounter: Payer: Self-pay | Admitting: Podiatry

## 2022-06-23 ENCOUNTER — Other Ambulatory Visit: Payer: Self-pay

## 2022-06-23 ENCOUNTER — Other Ambulatory Visit (HOSPITAL_COMMUNITY): Payer: Self-pay

## 2022-06-25 ENCOUNTER — Other Ambulatory Visit: Payer: Self-pay

## 2022-06-25 ENCOUNTER — Ambulatory Visit (INDEPENDENT_AMBULATORY_CARE_PROVIDER_SITE_OTHER): Payer: 59 | Admitting: Podiatry

## 2022-06-25 ENCOUNTER — Encounter: Payer: Self-pay | Admitting: Podiatry

## 2022-06-25 ENCOUNTER — Other Ambulatory Visit (HOSPITAL_COMMUNITY): Payer: Self-pay

## 2022-06-25 DIAGNOSIS — Z9889 Other specified postprocedural states: Secondary | ICD-10-CM

## 2022-06-25 DIAGNOSIS — S86311D Strain of muscle(s) and tendon(s) of peroneal muscle group at lower leg level, right leg, subsequent encounter: Secondary | ICD-10-CM

## 2022-06-25 MED ORDER — SERTRALINE HCL 100 MG PO TABS
100.0000 mg | ORAL_TABLET | Freq: Every morning | ORAL | 2 refills | Status: DC
  Filled 2022-10-02: qty 90, 90d supply, fill #0
  Filled 2023-01-04 – 2023-01-05 (×2): qty 90, 90d supply, fill #1
  Filled 2023-03-26: qty 90, 90d supply, fill #2

## 2022-06-25 NOTE — Progress Notes (Signed)
She presents today for postop visit date of surgery 05/16/2022 right foot peroneal tendon repair and endoscopic fasciotomy with a PRP injection.  She has been nonweightbearing for the last 2 weeks in her cam walker.  States that the day she left from here she fell at bruising her medial thigh on the right side but not injuring the foot.  She states that she did not land on the foot.  Objective: Vital signs are stable alert and oriented x 3.  There is no erythema some moderate edema to the surgical site laterally.  She has no pain on palpation of the plantar fascial procedure however she does have some tenderness on palpation of the peroneal tendon repair I see no signs of infection.  She has good abduction against resistance abduction and plantarflexion against resistance.  Assessment: Well-healing surgical foot and ankle.  Plan: Put her back in her compression anklets start her back with her partial weightbearing in her cam boot and I will follow-up with her in 2 weeks for full weightbearing at that time.

## 2022-06-26 ENCOUNTER — Encounter: Payer: Self-pay | Admitting: Podiatry

## 2022-06-27 NOTE — Telephone Encounter (Signed)
Called pt-she is having pain from the frame of the boot pressing against the lateral ankle. Advised to pad the area to prevent. Said she would try this.

## 2022-07-09 ENCOUNTER — Ambulatory Visit (INDEPENDENT_AMBULATORY_CARE_PROVIDER_SITE_OTHER): Payer: 59 | Admitting: Podiatry

## 2022-07-09 DIAGNOSIS — Z9889 Other specified postprocedural states: Secondary | ICD-10-CM

## 2022-07-09 DIAGNOSIS — S86311D Strain of muscle(s) and tendon(s) of peroneal muscle group at lower leg level, right leg, subsequent encounter: Secondary | ICD-10-CM

## 2022-07-09 NOTE — Progress Notes (Signed)
She presents today for follow-up of her peroneal tendon repair and EPF right foot.  She denies fever chills nausea vomit muscle aches pains states that she is regular rate of her crutches date of surgery was 05/16/2022.  She states that she has had no pain it does get tired by the end of the day and that she applies ice to it.  Objective: Vital signs are stable alert oriented x 3 there is no erythema to some mild edema no cellulitis drainage or odor.  Pulses are palpable.  She has good abduction against resistance abduction and plantarflexion and eversion dorsiflexion.  No open lesions or wounds are identified.  Assessment: Well-healing surgical foot and ankle right.  Plan: Julie [name] encouraged her to use the boot for another week and a half and then she may discontinue the boot use her ankle brace and her tennis shoes.  She is to either stay in shoes or the boot at all times.  She understands and is amenable to it I will follow-up with her in 3 weeks

## 2022-07-10 ENCOUNTER — Other Ambulatory Visit (HOSPITAL_COMMUNITY): Payer: Self-pay

## 2022-07-10 MED ORDER — ELETRIPTAN HYDROBROMIDE 40 MG PO TABS
40.0000 mg | ORAL_TABLET | Freq: Every day | ORAL | 5 refills | Status: DC
  Filled 2022-07-10: qty 12, 12d supply, fill #0

## 2022-07-14 ENCOUNTER — Ambulatory Visit: Payer: 59 | Admitting: Neurology

## 2022-07-18 ENCOUNTER — Other Ambulatory Visit (HOSPITAL_COMMUNITY): Payer: Self-pay

## 2022-07-21 ENCOUNTER — Other Ambulatory Visit (HOSPITAL_COMMUNITY): Payer: Self-pay

## 2022-07-21 ENCOUNTER — Other Ambulatory Visit: Payer: Self-pay

## 2022-07-24 ENCOUNTER — Encounter: Payer: Self-pay | Admitting: Podiatry

## 2022-07-29 DIAGNOSIS — N390 Urinary tract infection, site not specified: Secondary | ICD-10-CM | POA: Diagnosis not present

## 2022-07-30 ENCOUNTER — Ambulatory Visit (INDEPENDENT_AMBULATORY_CARE_PROVIDER_SITE_OTHER): Payer: 59 | Admitting: Podiatry

## 2022-07-30 ENCOUNTER — Encounter: Payer: Self-pay | Admitting: Podiatry

## 2022-07-30 DIAGNOSIS — S86311D Strain of muscle(s) and tendon(s) of peroneal muscle group at lower leg level, right leg, subsequent encounter: Secondary | ICD-10-CM

## 2022-07-30 DIAGNOSIS — Z9889 Other specified postprocedural states: Secondary | ICD-10-CM

## 2022-07-30 NOTE — Progress Notes (Addendum)
She presents today date of surgery 05/16/2022 right foot peroneal tendon repair EPF and PRP injection.  States that there is a lot of popping by the end of the day when the foot gets swollen right and here she points to the ankle near the peroneal retinaculum.  Objective: Vital signs are stable she is alert oriented x 3 the right foot is moderately edematous on the lateral aspect with fluctuance on palpation near the peroneal retinaculum.  Currently there is no popping on range of motion actively or passively.  Assessment: Well-healing surgical foot continued swelling.  Plan: Encouraged her to continue to wear the ankle brace try to keep the foot up is much as possible and allow her to get back to work on 4 March but in the meantime we will start physical therapy.  Addendum: Patient will have PT for the next 6 weeks. It's best to return to work after completion of PT, which should be on or around April 22nd.

## 2022-08-04 ENCOUNTER — Encounter: Payer: Self-pay | Admitting: Podiatry

## 2022-08-04 ENCOUNTER — Ambulatory Visit (HOSPITAL_COMMUNITY): Payer: 59 | Attending: Podiatry | Admitting: Physical Therapy

## 2022-08-04 ENCOUNTER — Other Ambulatory Visit: Payer: Self-pay | Admitting: Family Medicine

## 2022-08-04 DIAGNOSIS — R2689 Other abnormalities of gait and mobility: Secondary | ICD-10-CM | POA: Insufficient documentation

## 2022-08-04 DIAGNOSIS — S86311D Strain of muscle(s) and tendon(s) of peroneal muscle group at lower leg level, right leg, subsequent encounter: Secondary | ICD-10-CM | POA: Insufficient documentation

## 2022-08-04 DIAGNOSIS — Z9889 Other specified postprocedural states: Secondary | ICD-10-CM | POA: Insufficient documentation

## 2022-08-04 DIAGNOSIS — M25571 Pain in right ankle and joints of right foot: Secondary | ICD-10-CM | POA: Insufficient documentation

## 2022-08-04 DIAGNOSIS — Z1231 Encounter for screening mammogram for malignant neoplasm of breast: Secondary | ICD-10-CM

## 2022-08-04 NOTE — Therapy (Signed)
OUTPATIENT PHYSICAL THERAPY LOWER EXTREMITY EVALUATION   Patient Name: Julie [name] MRN: [id number] DOB:Jun 28, 1975, 47 y.o., female Today's Date: 08/04/2022  END OF SESSION:  PT End of Session - 08/04/22 0939     Visit Number 1    Number of Visits 12    Date for PT Re-Evaluation 09/15/22    Authorization Type [facility]    Progress Note Due on Visit 10    PT Start Time 0900    PT Stop Time 0942    PT Time Calculation (min) 42 min    Activity Tolerance Patient tolerated treatment well    Behavior During Therapy [facility] for tasks assessed/performed             Past Medical History:  Diagnosis Date   Anemia    Anemia    Anxiety    Breast discharge 07/01/2013   Pt noticed discharge, none on exam today will check TSH and Prolactin   Breast mass, right 10/20/2012   Depression    Dysmenorrhea 09/19/2015   Dyspareunia in female 09/19/2015   Ectopic pregnancy    Eczema    GERD (gastroesophageal reflux disease)    no meds   Headache    Hernia of abdominal wall 01/04/2013   Kidney stone    Left breast mass 04/12/2014   Has pea sized nodule at 6 o'clock tender and mobile, will get mammogram and [place] if needed   Menorrhagia with regular cycle 09/19/2015   Pelvic pain in female 09/19/2015   Placenta previa 2012   Current pregnancy    Urinary tract infection    Vaginal discharge 07/01/2013   Past Surgical History:  Procedure Laterality Date   ABDOMINAL HYSTERECTOMY N/A 10/10/2015   Procedure: HYSTERECTOMY ABDOMINAL;  Surgeon: Florian Buff, MD;  Location: [facility];  Service: Gynecology;  Laterality: N/A;   APPENDECTOMY     APPENDECTOMY     BILATERAL SALPINGECTOMY Bilateral 10/10/2015   Procedure: BILATERAL SALPINGECTOMY;  Surgeon: Florian Buff, MD;  Location: [facility];  Service: Gynecology;  Laterality: Bilateral;   BREAST BIOPSY Right 11/04/2012   hylanized tissue with minimal chronic inflammatory tissue   BREAST BIOPSY Right 07/16/2021   CESAREAN SECTION  04/24/2011    Procedure: CESAREAN SECTION;  Surgeon: Florian Buff, MD;  Location: [place] [facility];  Service: Gynecology;  Laterality: N/A;  Primary/Previa   CHOLECYSTECTOMY     COLONOSCOPY     CYSTOSCOPY W/ URETERAL STENT PLACEMENT Left 08/04/2014   Procedure: CYSTOSCOPY WITH LEFT  RETROGRADE PYELOGRAM/ LEFT URETERAL STENT PLACEMENT;  Surgeon: Festus Aloe, MD;  Location: [facility];  Service: Urology;  Laterality: Left;   TONSILLECTOMY     UPPER GI ENDOSCOPY     Patient Active Problem List   Diagnosis Date Noted   Change in bowel habit 06/25/2021   Colon cancer screening 06/25/2021   Dysphagia 06/25/2021   Gastroesophageal reflux disease 06/25/2021   Rectal bleeding 06/25/2021   Anxiety 01/04/2021   Depressive disorder 01/04/2021   Ovarian cyst 01/10/2019   S/P hysterectomy 10/10/2015   Dyspareunia in female 09/19/2015   Dysmenorrhea 09/19/2015   Left breast mass 04/12/2014   Hernia of abdominal wall 01/04/2013   Breast mass, right 10/20/2012    PCP: Sharilyn Sites MD   REFERRING PROVIDER: Garrel Ridgel, [place]  REFERRING DIAG: [phone] (ICD-10-CM) - Tear of peroneal tendon, right, subsequent encounter Z98.890 (ICD-10-CM) - Status post right foot surgery  THERAPY DIAG:  Pain in right ankle and joints of right foot - Plan:  PT plan of care cert/re-cert  Other abnormalities of gait and mobility - Plan: PT plan of care cert/re-cert  Rationale for Evaluation and Treatment: Rehabilitation  ONSET DATE: 05/17/23  SUBJECTIVE:   SUBJECTIVE STATEMENT: Patient presents to therapy with complaint of RT ankle pain. She had foot surgery 05/16/22. She is still having trouble with the way she walks. She is still having a lot of pain when walking. She states that sometimes it will "catch" and the pain is breathtaking. She is not taking pain medication at this time. She wears lace up ankle brace for stability.   PERTINENT HISTORY: RT ankle surgery 05/16/22 PAIN:  Are you having pain? Yes: NPRS scale: 0/10 at rest;  3/10 average; at worst 10/10 Pain location: Rt lateral ankle  Pain description: sharp, "breathtaking"  Aggravating factors: walking, WB  Relieving factors: non WB   PRECAUTIONS: None  WEIGHT BEARING RESTRICTIONS: No  FALLS:  Has patient fallen in last 6 months? Yes. Number of falls 1  LIVING ENVIRONMENT: Lives with: lives with their family and lives with their spouse Lives in: House/apartment Stairs: No Has following equipment at home: Crutches  OCCUPATION: CMA   PLOF: Independent  PATIENT GOALS: Be able to use my foot without popping and cracking   NEXT MD VISIT: None scheduled   OBJECTIVE:   DIAGNOSTIC FINDINGS: NA  PATIENT SURVEYS:  FOTO 53% function   COGNITION: Overall cognitive status: Within functional limits for tasks assessed    PALPATION: Min TTP about Mid RT foot arch   LOWER EXTREMITY ROM:  Active ROM Right eval Left eval  Hip flexion    Hip extension    Hip abduction    Hip adduction    Hip internal rotation    Hip external rotation    Knee flexion    Knee extension    Ankle dorsiflexion 4   Ankle plantarflexion 46   Ankle inversion    Ankle eversion     (Blank rows = not tested)  LOWER EXTREMITY MMT:  MMT Right eval Left eval  Hip flexion    Hip extension    Hip abduction    Hip adduction    Hip internal rotation    Hip external rotation    Knee flexion    Knee extension    Ankle dorsiflexion 4   Ankle plantarflexion    Ankle inversion 4   Ankle eversion 4 P!    (Blank rows = not tested)   GAIT: Slightly antalgic, decreased stance on RT, no AD   TODAY'S TREATMENT:                                                                                                                              DATE:  08/04/22 Eval     PATIENT EDUCATION:  Education details: on Eval findings, POC and HEP  Person educated: Patient Education method: Explanation Education comprehension: verbalized understanding  HOME EXERCISE  PROGRAM: Access Code: [email or web address] URL:  [email or web address] Date: 08/04/2022 Prepared by: Josue Hector  Exercises - Seated Ankle Plantar Flexion with Resistance Loop  - 2-3 x daily - 7 x weekly - 3 sets - 10 reps - Seated Figure 4 Ankle Inversion with Resistance  - 2-3 x daily - 7 x weekly - 3 sets - 10 reps - Seated Ankle Eversion with Resistance  - 2-3 x daily - 7 x weekly - 3 sets - 10 reps  ASSESSMENT:  CLINICAL IMPRESSION: Patient is a 47 y.o. female who presents to physical therapy with complaint of Rt ankle pain. Patient demonstrates muscle weakness, reduced ROM, and fascial restrictions which are likely contributing to symptoms of pain and are negatively impacting patient ability to perform ADLs and functional mobility tasks. Patient will benefit from skilled physical therapy services to address these deficits to reduce pain and improve level of function with ADLs and functional mobility tasks.   OBJECTIVE IMPAIRMENTS: Abnormal gait, decreased activity tolerance, decreased balance, decreased mobility, difficulty walking, decreased ROM, decreased strength, increased edema, increased fascial restrictions, impaired flexibility, improper body mechanics, and pain.   ACTIVITY LIMITATIONS: carrying, lifting, bending, standing, squatting, sleeping, stairs, transfers, and locomotion level  PARTICIPATION LIMITATIONS: meal prep, cleaning, laundry, driving, shopping, community activity, occupation, and yard work  PERSONAL FACTORS:  None  are also affecting patient's functional outcome.   REHAB POTENTIAL: Good  CLINICAL DECISION MAKING: Stable/uncomplicated  EVALUATION COMPLEXITY: Low   GOALS: SHORT TERM GOALS: Target date: 08/25/2022  Patient will be independent with initial HEP and self-management strategies to improve functional outcomes Baseline:  Goal status: INITIAL    LONG TERM GOALS: Target date: 09/15/2022  Patient will be independent with advanced HEP and  self-management strategies to improve functional outcomes Baseline:  Goal status: INITIAL  2.  Patient will improve FOTO score to predicted value to indicate improvement in functional outcomes Baseline: 53% Goal status: INITIAL  3.  Patient will have Rt ankle DF to at least 10 degrees to improve gait mechanics for improved LOF with ADLs and functional mobility. Baseline: 4 Goal status: INITIAL  4. Patient will have equal to or > 4+/5 MMT throughout RT ankle to improve ability to perform functional mobility, stair ambulation and ADLs.  Baseline: See MMT Goal status: INITIAL   PLAN:  PT FREQUENCY: 1-2x/week  PT DURATION: 6 weeks  PLANNED INTERVENTIONS: Therapeutic exercises, Therapeutic activity, Neuromuscular re-education, Balance training, Gait training, Patient/Family education, Joint manipulation, Joint mobilization, Stair training, Aquatic Therapy, Dry Needling, Electrical stimulation, Spinal manipulation, Spinal mobilization, Cryotherapy, Moist heat, scar mobilization, Taping, Traction, Ultrasound, Biofeedback, Ionotophoresis '4mg'$ /ml Dexamethasone, and Manual therapy.   PLAN FOR NEXT SESSION: Progress ankle strength and stability as tolerated. Gait and balance when ready. Manual scar tissue massage and edema massage PRN  9:41 AM, 08/04/22 Josue Hector PT DPT  Physical Therapist with [facility]  [phone]

## 2022-08-06 ENCOUNTER — Ambulatory Visit (HOSPITAL_COMMUNITY): Payer: 59 | Admitting: Physical Therapy

## 2022-08-06 DIAGNOSIS — R2689 Other abnormalities of gait and mobility: Secondary | ICD-10-CM | POA: Diagnosis not present

## 2022-08-06 DIAGNOSIS — M25571 Pain in right ankle and joints of right foot: Secondary | ICD-10-CM

## 2022-08-06 DIAGNOSIS — S86311D Strain of muscle(s) and tendon(s) of peroneal muscle group at lower leg level, right leg, subsequent encounter: Secondary | ICD-10-CM | POA: Diagnosis not present

## 2022-08-06 DIAGNOSIS — Z9889 Other specified postprocedural states: Secondary | ICD-10-CM | POA: Diagnosis not present

## 2022-08-06 NOTE — Therapy (Signed)
OUTPATIENT PHYSICAL THERAPY LOWER EXTREMITY TREATMENT   Patient Name: Julie [name] MRN: [id number] DOB:[birth year], 47 y.o., female Today's Date: 08/06/2022  END OF SESSION:  PT End of Session - 08/06/22 0906     Visit Number 2    Number of Visits 12    Date for PT Re-Evaluation 09/15/22    Authorization Type [facility]    Progress Note Due on Visit 10    PT Start Time 0904    PT Stop Time 0944    PT Time Calculation (min) 40 min    Activity Tolerance Patient tolerated treatment well    Behavior During Therapy [facility] for tasks assessed/performed             Past Medical History:  Diagnosis Date   Anemia    Anemia    Anxiety    Breast discharge 07/01/2013   Pt noticed discharge, none on exam today will check TSH and Prolactin   Breast mass, right 10/20/2012   Depression    Dysmenorrhea 09/19/2015   Dyspareunia in female 09/19/2015   Ectopic pregnancy    Eczema    GERD (gastroesophageal reflux disease)    no meds   Headache    Hernia of abdominal wall 01/04/2013   Kidney stone    Left breast mass 04/12/2014   Has pea sized nodule at 6 o'clock tender and mobile, will get mammogram and [place] if needed   Menorrhagia with regular cycle 09/19/2015   Pelvic pain in female 09/19/2015   Placenta previa 2012   Current pregnancy    Urinary tract infection    Vaginal discharge 07/01/2013   Past Surgical History:  Procedure Laterality Date   ABDOMINAL HYSTERECTOMY N/A 10/10/2015   Procedure: HYSTERECTOMY ABDOMINAL;  Surgeon: Florian Buff, MD;  Location: [facility];  Service: Gynecology;  Laterality: N/A;   APPENDECTOMY     APPENDECTOMY     BILATERAL SALPINGECTOMY Bilateral 10/10/2015   Procedure: BILATERAL SALPINGECTOMY;  Surgeon: Florian Buff, MD;  Location: [facility];  Service: Gynecology;  Laterality: Bilateral;   BREAST BIOPSY Right 11/04/2012   hylanized tissue with minimal chronic inflammatory tissue   BREAST BIOPSY Right 07/16/2021   CESAREAN SECTION  04/24/2011    Procedure: CESAREAN SECTION;  Surgeon: Florian Buff, MD;  Location: [place] [facility];  Service: Gynecology;  Laterality: N/A;  Primary/Previa   CHOLECYSTECTOMY     COLONOSCOPY     CYSTOSCOPY W/ URETERAL STENT PLACEMENT Left 08/04/2014   Procedure: CYSTOSCOPY WITH LEFT  RETROGRADE PYELOGRAM/ LEFT URETERAL STENT PLACEMENT;  Surgeon: Festus Aloe, MD;  Location: [facility];  Service: Urology;  Laterality: Left;   TONSILLECTOMY     UPPER GI ENDOSCOPY     Patient Active Problem List   Diagnosis Date Noted   Change in bowel habit 06/25/2021   Colon cancer screening 06/25/2021   Dysphagia 06/25/2021   Gastroesophageal reflux disease 06/25/2021   Rectal bleeding 06/25/2021   Anxiety 01/04/2021   Depressive disorder 01/04/2021   Ovarian cyst 01/10/2019   S/P hysterectomy 10/10/2015   Dyspareunia in female 09/19/2015   Dysmenorrhea 09/19/2015   Left breast mass 04/12/2014   Hernia of abdominal wall 01/04/2013   Breast mass, right 10/20/2012    PCP: Sharilyn Sites MD   REFERRING PROVIDER: Garrel Ridgel, [place]  REFERRING DIAG: [phone] (ICD-10-CM) - Tear of peroneal tendon, right, subsequent encounter Z98.890 (ICD-10-CM) - Status post right foot surgery  THERAPY DIAG:  Pain in right ankle and joints of right foot  Other  abnormalities of gait and mobility  Rationale for Evaluation and Treatment: Rehabilitation  ONSET DATE: 05/17/23  SUBJECTIVE:   SUBJECTIVE STATEMENT: "It's just stiff". Did fine with HEP.    Eval: Patient presents to therapy with complaint of RT ankle pain. She had foot surgery 05/16/22. She is still having trouble with the way she walks. She is still having a lot of pain when walking. She states that sometimes it will "catch" and the pain is breathtaking. She is not taking pain medication at this time. She wears lace up ankle brace for stability.   PERTINENT HISTORY: RT ankle surgery 05/16/22 PAIN:  Are you having pain? Yes: NPRS scale: 2/10 Pain location: Rt lateral ankle   Pain description: sharp, "breathtaking"  Aggravating factors: walking, WB  Relieving factors: non WB   PRECAUTIONS: None  WEIGHT BEARING RESTRICTIONS: No  FALLS:  Has patient fallen in last 6 months? Yes. Number of falls 1  LIVING ENVIRONMENT: Lives with: lives with their family and lives with their spouse Lives in: House/apartment Stairs: No Has following equipment at home: Crutches  OCCUPATION: CMA   PLOF: Independent  PATIENT GOALS: Be able to use my foot without popping and cracking   NEXT MD VISIT: None scheduled   OBJECTIVE:   DIAGNOSTIC FINDINGS: NA  PATIENT SURVEYS:  FOTO 53% function   COGNITION: Overall cognitive status: Within functional limits for tasks assessed    PALPATION: Min TTP about Mid RT foot arch   LOWER EXTREMITY ROM:  Active ROM Right eval Left eval  Hip flexion    Hip extension    Hip abduction    Hip adduction    Hip internal rotation    Hip external rotation    Knee flexion    Knee extension    Ankle dorsiflexion 4   Ankle plantarflexion 46   Ankle inversion    Ankle eversion     (Blank rows = not tested)  LOWER EXTREMITY MMT:  MMT Right eval Left eval  Hip flexion    Hip extension    Hip abduction    Hip adduction    Hip internal rotation    Hip external rotation    Knee flexion    Knee extension    Ankle dorsiflexion 4   Ankle plantarflexion    Ankle inversion 4   Ankle eversion 4 P!    (Blank rows = not tested)   GAIT: Slightly antalgic, decreased stance on RT, no AD   TODAY'S TREATMENT:                                                                                                                              DATE:   08/06/22 Ankle circles x20 CW/ CCW Ankle band 3 way (PF/ INV/ EV) GTB x 30 each BAPs Lv 1 x 30 DF/PF; INV/ EV; CW/ CCW  Calf stretch seated with strap 3 x 30"   SLR 3 x 10 Sidelying hip abduction 3  x 10   Manual scar tissue massage and RT ankle PROM  08/04/22 Eval     PATIENT  EDUCATION:  Education details: on Eval findings, POC and HEP  Person educated: Patient Education method: Explanation Education comprehension: verbalized understanding  HOME EXERCISE PROGRAM: Access Code: [email or web address] URL: [email or web address]  08/06/22 - Straight Leg Raise  - 2-3 x daily - 7 x weekly - 3 sets - 10 reps - Sidelying Hip Abduction  - 2-3 x daily - 7 x weekly - 3 sets - 10 reps - Seated Calf Stretch with Strap  - 2-3 x daily - 7 x weekly - 1 sets - 3 reps - 30 second hold  Date: 08/04/2022 Prepared by: Josue Hector  Exercises - Seated Ankle Plantar Flexion with Resistance Loop  - 2-3 x daily - 7 x weekly - 3 sets - 10 reps - Seated Figure 4 Ankle Inversion with Resistance  - 2-3 x daily - 7 x weekly - 3 sets - 10 reps - Seated Ankle Eversion with Resistance  - 2-3 x daily - 7 x weekly - 3 sets - 10 reps  ASSESSMENT:  CLINICAL IMPRESSION: Patient tolerated session well today. Initiated there ex. Focused on pain free ankle mobility and control. Also added green thera band for strength progression with ankle 3 way. Patient showing good return of HEP exercises. Patient educated on purpose and function of all added exercises today. Issued HEP handout of new exercises. Patient will continue to benefit from skilled therapy services to reduce remaining deficits and improve functional ability.    OBJECTIVE IMPAIRMENTS: Abnormal gait, decreased activity tolerance, decreased balance, decreased mobility, difficulty walking, decreased ROM, decreased strength, increased edema, increased fascial restrictions, impaired flexibility, improper body mechanics, and pain.   ACTIVITY LIMITATIONS: carrying, lifting, bending, standing, squatting, sleeping, stairs, transfers, and locomotion level  PARTICIPATION LIMITATIONS: meal prep, cleaning, laundry, driving, shopping, community activity, occupation, and yard work  PERSONAL FACTORS:  None  are also affecting patient's functional  outcome.   REHAB POTENTIAL: Good  CLINICAL DECISION MAKING: Stable/uncomplicated  EVALUATION COMPLEXITY: Low   GOALS: SHORT TERM GOALS: Target date: 08/25/2022  Patient will be independent with initial HEP and self-management strategies to improve functional outcomes Baseline:  Goal status: INITIAL    LONG TERM GOALS: Target date: 09/15/2022  Patient will be independent with advanced HEP and self-management strategies to improve functional outcomes Baseline:  Goal status: INITIAL  2.  Patient will improve FOTO score to predicted value to indicate improvement in functional outcomes Baseline: 53% Goal status: INITIAL  3.  Patient will have Rt ankle DF to at least 10 degrees to improve gait mechanics for improved LOF with ADLs and functional mobility. Baseline: 4 Goal status: INITIAL  4. Patient will have equal to or > 4+/5 MMT throughout RT ankle to improve ability to perform functional mobility, stair ambulation and ADLs.  Baseline: See MMT Goal status: INITIAL   PLAN:  PT FREQUENCY: 1-2x/week  PT DURATION: 6 weeks  PLANNED INTERVENTIONS: Therapeutic exercises, Therapeutic activity, Neuromuscular re-education, Balance training, Gait training, Patient/Family education, Joint manipulation, Joint mobilization, Stair training, Aquatic Therapy, Dry Needling, Electrical stimulation, Spinal manipulation, Spinal mobilization, Cryotherapy, Moist heat, scar mobilization, Taping, Traction, Ultrasound, Biofeedback, Ionotophoresis 44m/ml Dexamethasone, and Manual therapy.   PLAN FOR NEXT SESSION: Progress ankle strength and stability as tolerated. Gait and balance when ready. Manual scar tissue massage and edema massage PRN  9:46 AM, 08/06/22 CJosue HectorPT DPT  [facility] (  [phone] 

## 2022-08-07 ENCOUNTER — Ambulatory Visit
Admission: RE | Admit: 2022-08-07 | Discharge: 2022-08-07 | Disposition: A | Discharge status: Home / Self Care | Payer: 59 | Source: Ambulatory Visit | Attending: Family Medicine | Admitting: Family Medicine

## 2022-08-07 DIAGNOSIS — Z1231 Encounter for screening mammogram for malignant neoplasm of breast: Secondary | ICD-10-CM

## 2022-08-11 ENCOUNTER — Encounter (HOSPITAL_COMMUNITY): Payer: Self-pay | Admitting: Physical Therapy

## 2022-08-11 ENCOUNTER — Ambulatory Visit (HOSPITAL_COMMUNITY): Payer: 59 | Attending: Podiatry | Admitting: Physical Therapy

## 2022-08-11 DIAGNOSIS — R2689 Other abnormalities of gait and mobility: Secondary | ICD-10-CM

## 2022-08-11 DIAGNOSIS — M25571 Pain in right ankle and joints of right foot: Secondary | ICD-10-CM | POA: Diagnosis not present

## 2022-08-11 DIAGNOSIS — N390 Urinary tract infection, site not specified: Secondary | ICD-10-CM | POA: Diagnosis not present

## 2022-08-11 NOTE — Therapy (Signed)
OUTPATIENT PHYSICAL THERAPY LOWER EXTREMITY TREATMENT   Patient Name: Julie [name] MRN: [id number] DOB:16-Feb-1976, 47 y.o., female Today's Date: 08/11/2022  END OF SESSION:  PT End of Session - 08/11/22 0733     Visit Number 3    Number of Visits 12    Date for PT Re-Evaluation 09/15/22    Authorization Type [facility]    Progress Note Due on Visit 10    PT Start Time 0733    PT Stop Time 0811    PT Time Calculation (min) 38 min    Activity Tolerance Patient tolerated treatment well    Behavior During Therapy [facility] for tasks assessed/performed             Past Medical History:  Diagnosis Date   Anemia    Anemia    Anxiety    Breast discharge 07/01/2013   Pt noticed discharge, none on exam today will check TSH and Prolactin   Breast mass, right 10/20/2012   Depression    Dysmenorrhea 09/19/2015   Dyspareunia in female 09/19/2015   Ectopic pregnancy    Eczema    GERD (gastroesophageal reflux disease)    no meds   Headache    Hernia of abdominal wall 01/04/2013   Kidney stone    Left breast mass 04/12/2014   Has pea sized nodule at 6 o'clock tender and mobile, will get mammogram and [place] if needed   Menorrhagia with regular cycle 09/19/2015   Pelvic pain in female 09/19/2015   Placenta previa 2012   Current pregnancy    Urinary tract infection    Vaginal discharge 07/01/2013   Past Surgical History:  Procedure Laterality Date   ABDOMINAL HYSTERECTOMY N/A 10/10/2015   Procedure: HYSTERECTOMY ABDOMINAL;  Surgeon: Florian Buff, MD;  Location: [facility];  Service: Gynecology;  Laterality: N/A;   APPENDECTOMY     APPENDECTOMY     BILATERAL SALPINGECTOMY Bilateral 10/10/2015   Procedure: BILATERAL SALPINGECTOMY;  Surgeon: Florian Buff, MD;  Location: [facility];  Service: Gynecology;  Laterality: Bilateral;   BREAST BIOPSY Right 11/04/2012   hylanized tissue with minimal chronic inflammatory tissue   BREAST BIOPSY Right 07/16/2021   CESAREAN SECTION  04/24/2011   Procedure:  CESAREAN SECTION;  Surgeon: Florian Buff, MD;  Location: [place] [facility];  Service: Gynecology;  Laterality: N/A;  Primary/Previa   CHOLECYSTECTOMY     COLONOSCOPY     CYSTOSCOPY W/ URETERAL STENT PLACEMENT Left 08/04/2014   Procedure: CYSTOSCOPY WITH LEFT  RETROGRADE PYELOGRAM/ LEFT URETERAL STENT PLACEMENT;  Surgeon: Festus Aloe, MD;  Location: [facility];  Service: Urology;  Laterality: Left;   TONSILLECTOMY     UPPER GI ENDOSCOPY     Patient Active Problem List   Diagnosis Date Noted   Change in bowel habit 06/25/2021   Colon cancer screening 06/25/2021   Dysphagia 06/25/2021   Gastroesophageal reflux disease 06/25/2021   Rectal bleeding 06/25/2021   Anxiety 01/04/2021   Depressive disorder 01/04/2021   Ovarian cyst 01/10/2019   S/P hysterectomy 10/10/2015   Dyspareunia in female 09/19/2015   Dysmenorrhea 09/19/2015   Left breast mass 04/12/2014   Hernia of abdominal wall 01/04/2013   Breast mass, right 10/20/2012    PCP: Sharilyn Sites MD   REFERRING PROVIDER: Garrel Ridgel, [place]  REFERRING DIAG: [phone] (ICD-10-CM) - Tear of peroneal tendon, right, subsequent encounter Z98.890 (ICD-10-CM) - Status post right foot surgery  THERAPY DIAG:  Pain in right ankle and joints of right foot  Other  abnormalities of gait and mobility  Rationale for Evaluation and Treatment: Rehabilitation  ONSET DATE: 05/17/23  SUBJECTIVE:   SUBJECTIVE STATEMENT: Ankle is alright. Popping and cracking has been minimal but was acting up yesterday. Popping is painful.    Eval: Patient presents to therapy with complaint of RT ankle pain. She had foot surgery 05/16/22. She is still having trouble with the way she walks. She is still having a lot of pain when walking. She states that sometimes it will "catch" and the pain is breathtaking. She is not taking pain medication at this time. She wears lace up ankle brace for stability.   PERTINENT HISTORY: RT ankle surgery 05/16/22 PAIN:  Are you having pain?  Yes: NPRS scale: 2/10 Pain location: Rt lateral ankle  Pain description: sharp, "breathtaking"  Aggravating factors: walking, WB  Relieving factors: non WB   PRECAUTIONS: None  WEIGHT BEARING RESTRICTIONS: No  FALLS:  Has patient fallen in last 6 months? Yes. Number of falls 1  LIVING ENVIRONMENT: Lives with: lives with their family and lives with their spouse Lives in: House/apartment Stairs: No Has following equipment at home: Crutches  OCCUPATION: CMA   PLOF: Independent  PATIENT GOALS: Be able to use my foot without popping and cracking   NEXT MD VISIT: None scheduled   OBJECTIVE:   DIAGNOSTIC FINDINGS: NA  PATIENT SURVEYS:  FOTO 53% function   COGNITION: Overall cognitive status: Within functional limits for tasks assessed    PALPATION: Min TTP about Mid RT foot arch   LOWER EXTREMITY ROM:  Active ROM Right eval Left eval  Hip flexion    Hip extension    Hip abduction    Hip adduction    Hip internal rotation    Hip external rotation    Knee flexion    Knee extension    Ankle dorsiflexion 4   Ankle plantarflexion 46   Ankle inversion    Ankle eversion     (Blank rows = not tested)  LOWER EXTREMITY MMT:  MMT Right eval Left eval  Hip flexion    Hip extension    Hip abduction    Hip adduction    Hip internal rotation    Hip external rotation    Knee flexion    Knee extension    Ankle dorsiflexion 4   Ankle plantarflexion    Ankle inversion 4   Ankle eversion 4 P!    (Blank rows = not tested)   GAIT: Slightly antalgic, decreased stance on RT, no AD   TODAY'S TREATMENT:                                                                                                                              DATE:  08/11/22 Ankle circles x20 CW/ CCW Ankle band 3 way (PF/ INV/ EV) GTB x 30 each BAPs Lv 1 x 30 DF/PF; INV/ EV; CW/ CCW Standing calf stretch on slant board 3x 30 second holds  Standing HR 1 x 10  Standing TR 1 x 10  Tandem stance 3 x  30 second holds bilateral  Lateral stepping with mini squat 8x 5 feet STS 1 x 10   08/06/22 Ankle circles x20 CW/ CCW Ankle band 3 way (PF/ INV/ EV) GTB x 30 each BAPs Lv 1 x 30 DF/PF; INV/ EV; CW/ CCW  Calf stretch seated with strap 3 x 30"   SLR 3 x 10 Sidelying hip abduction 3 x 10   Manual scar tissue massage and RT ankle PROM  08/04/22 Eval     PATIENT EDUCATION:  Education details: 08/11/22: HEP, gait mechanics, compression garments;  EVAL: on Eval findings, POC and HEP  Person educated: Patient Education method: Explanation Education comprehension: verbalized understanding  HOME EXERCISE PROGRAM: Access Code: [email or web address] URL: [email or web address] 08/11/22 - Heel Raises with Counter Support  - 3 x daily - 7 x weekly - 1-2 sets - 10 reps - Toe Raises with Counter Support  - 3 x daily - 7 x weekly - 1-2 sets - 10 reps - Tandem Stance  - 3 x daily - 7 x weekly - 3 reps - 30 second hold - Sit to Stand with Arms Crossed  - 3 x daily - 7 x weekly - 1-2 sets - 10 reps  08/06/22 - Straight Leg Raise  - 2-3 x daily - 7 x weekly - 3 sets - 10 reps - Sidelying Hip Abduction  - 2-3 x daily - 7 x weekly - 3 sets - 10 reps - Seated Calf Stretch with Strap  - 2-3 x daily - 7 x weekly - 1 sets - 3 reps - 30 second hold  Date: 08/04/2022 Prepared by: Josue Hector  Exercises - Seated Ankle Plantar Flexion with Resistance Loop  - 2-3 x daily - 7 x weekly - 3 sets - 10 reps - Seated Figure 4 Ankle Inversion with Resistance  - 2-3 x daily - 7 x weekly - 3 sets - 10 reps - Seated Ankle Eversion with Resistance  - 2-3 x daily - 7 x weekly - 3 sets - 10 reps  ASSESSMENT:  CLINICAL IMPRESSION: Patient demonstrates good mechanics with previously completed exercises with minimal to no cueing. Began standing stretches and standing exercises which are tolerated well. Intermittent cueing for mechanics and positioning with new exercises with good carry over. Patient will continue to  benefit from physical therapy in order to improve function and reduce impairment.    OBJECTIVE IMPAIRMENTS: Abnormal gait, decreased activity tolerance, decreased balance, decreased mobility, difficulty walking, decreased ROM, decreased strength, increased edema, increased fascial restrictions, impaired flexibility, improper body mechanics, and pain.   ACTIVITY LIMITATIONS: carrying, lifting, bending, standing, squatting, sleeping, stairs, transfers, and locomotion level  PARTICIPATION LIMITATIONS: meal prep, cleaning, laundry, driving, shopping, community activity, occupation, and yard work  PERSONAL FACTORS:  None  are also affecting patient's functional outcome.   REHAB POTENTIAL: Good  CLINICAL DECISION MAKING: Stable/uncomplicated  EVALUATION COMPLEXITY: Low   GOALS: SHORT TERM GOALS: Target date: 08/25/2022  Patient will be independent with initial HEP and self-management strategies to improve functional outcomes Baseline:  Goal status: INITIAL    LONG TERM GOALS: Target date: 09/15/2022  Patient will be independent with advanced HEP and self-management strategies to improve functional outcomes Baseline:  Goal status: INITIAL  2.  Patient will improve FOTO score to predicted value to indicate improvement in functional outcomes Baseline: 53% Goal status: INITIAL  3.  Patient will have Rt ankle DF  to at least 10 degrees to improve gait mechanics for improved LOF with ADLs and functional mobility. Baseline: 4 Goal status: INITIAL  4. Patient will have equal to or > 4+/5 MMT throughout RT ankle to improve ability to perform functional mobility, stair ambulation and ADLs.  Baseline: See MMT Goal status: INITIAL   PLAN:  PT FREQUENCY: 1-2x/week  PT DURATION: 6 weeks  PLANNED INTERVENTIONS: Therapeutic exercises, Therapeutic activity, Neuromuscular re-education, Balance training, Gait training, Patient/Family education, Joint manipulation, Joint mobilization, Stair  training, Aquatic Therapy, Dry Needling, Electrical stimulation, Spinal manipulation, Spinal mobilization, Cryotherapy, Moist heat, scar mobilization, Taping, Traction, Ultrasound, Biofeedback, Ionotophoresis '4mg'$ /ml Dexamethasone, and Manual therapy.   PLAN FOR NEXT SESSION: Progress ankle strength and stability as tolerated. Gait and balance when ready. Manual scar tissue massage and edema massage PRN  7:34 AM, 08/11/22 Mearl Latin PT, DPT Physical Therapist at [facility]

## 2022-08-14 ENCOUNTER — Ambulatory Visit (HOSPITAL_COMMUNITY): Payer: 59 | Admitting: Physical Therapy

## 2022-08-14 DIAGNOSIS — R2689 Other abnormalities of gait and mobility: Secondary | ICD-10-CM

## 2022-08-14 DIAGNOSIS — M25571 Pain in right ankle and joints of right foot: Secondary | ICD-10-CM | POA: Diagnosis not present

## 2022-08-14 NOTE — Therapy (Addendum)
OUTPATIENT PHYSICAL THERAPY LOWER EXTREMITY TREATMENT   Patient Name: Julie [name] MRN: [id number] DOB:1976/03/10, 47 y.o., female Today's Date: 08/14/2022  END OF SESSION:  PT End of Session - 08/14/22 0814     Visit Number 4    Number of Visits 12    Date for PT Re-Evaluation 09/15/22    Authorization Type [facility]    Progress Note Due on Visit 10    PT Start Time 0820    PT Stop Time 0858    PT Time Calculation (min) 38 min    Activity Tolerance Patient tolerated treatment well    Behavior During Therapy [facility] for tasks assessed/performed             Past Medical History:  Diagnosis Date   Anemia    Anemia    Anxiety    Breast discharge 07/01/2013   Pt noticed discharge, none on exam today will check TSH and Prolactin   Breast mass, right 10/20/2012   Depression    Dysmenorrhea 09/19/2015   Dyspareunia in female 09/19/2015   Ectopic pregnancy    Eczema    GERD (gastroesophageal reflux disease)    no meds   Headache    Hernia of abdominal wall 01/04/2013   Kidney stone    Left breast mass 04/12/2014   Has pea sized nodule at 6 o'clock tender and mobile, will get mammogram and [place] if needed   Menorrhagia with regular cycle 09/19/2015   Pelvic pain in female 09/19/2015   Placenta previa 2012   Current pregnancy    Urinary tract infection    Vaginal discharge 07/01/2013   Past Surgical History:  Procedure Laterality Date   ABDOMINAL HYSTERECTOMY N/A 10/10/2015   Procedure: HYSTERECTOMY ABDOMINAL;  Surgeon: Florian Buff, MD;  Location: [facility];  Service: Gynecology;  Laterality: N/A;   APPENDECTOMY     APPENDECTOMY     BILATERAL SALPINGECTOMY Bilateral 10/10/2015   Procedure: BILATERAL SALPINGECTOMY;  Surgeon: Florian Buff, MD;  Location: [facility];  Service: Gynecology;  Laterality: Bilateral;   BREAST BIOPSY Right 11/04/2012   hylanized tissue with minimal chronic inflammatory tissue   BREAST BIOPSY Right 07/16/2021   CESAREAN SECTION  04/24/2011   Procedure:  CESAREAN SECTION;  Surgeon: Florian Buff, MD;  Location: [place] [facility];  Service: Gynecology;  Laterality: N/A;  Primary/Previa   CHOLECYSTECTOMY     COLONOSCOPY     CYSTOSCOPY W/ URETERAL STENT PLACEMENT Left 08/04/2014   Procedure: CYSTOSCOPY WITH LEFT  RETROGRADE PYELOGRAM/ LEFT URETERAL STENT PLACEMENT;  Surgeon: Festus Aloe, MD;  Location: [facility];  Service: Urology;  Laterality: Left;   TONSILLECTOMY     UPPER GI ENDOSCOPY     Patient Active Problem List   Diagnosis Date Noted   Change in bowel habit 06/25/2021   Colon cancer screening 06/25/2021   Dysphagia 06/25/2021   Gastroesophageal reflux disease 06/25/2021   Rectal bleeding 06/25/2021   Anxiety 01/04/2021   Depressive disorder 01/04/2021   Ovarian cyst 01/10/2019   S/P hysterectomy 10/10/2015   Dyspareunia in female 09/19/2015   Dysmenorrhea 09/19/2015   Left breast mass 04/12/2014   Hernia of abdominal wall 01/04/2013   Breast mass, right 10/20/2012    PCP: Sharilyn Sites MD   REFERRING PROVIDER: Garrel Ridgel, [place]  REFERRING DIAG: [phone] (ICD-10-CM) - Tear of peroneal tendon, right, subsequent encounter Z98.890 (ICD-10-CM) - Status post right foot surgery  THERAPY DIAG:  Pain in right ankle and joints of right foot  Other  abnormalities of gait and mobility  Rationale for Evaluation and Treatment: Rehabilitation  ONSET DATE: 05/17/23  SUBJECTIVE:   SUBJECTIVE STATEMENT: Ankle is starting to pop again. It is not as painful however. Has been able to walk more without ankle brace. No pain right now.    Eval: Patient presents to therapy with complaint of RT ankle pain. She had foot surgery 05/16/22. She is still having trouble with the way she walks. She is still having a lot of pain when walking. She states that sometimes it will "catch" and the pain is breathtaking. She is not taking pain medication at this time. She wears lace up ankle brace for stability.   PERTINENT HISTORY: RT ankle surgery 05/16/22 PAIN:   Are you having pain? Yes: NPRS scale: 0/10 Pain location: Rt lateral ankle  Pain description: sharp, "breathtaking"  Aggravating factors: walking, WB  Relieving factors: non WB   PRECAUTIONS: None  WEIGHT BEARING RESTRICTIONS: No  FALLS:  Has patient fallen in last 6 months? Yes. Number of falls 1  LIVING ENVIRONMENT: Lives with: lives with their family and lives with their spouse Lives in: House/apartment Stairs: No Has following equipment at home: Crutches  OCCUPATION: CMA   PLOF: Independent  PATIENT GOALS: Be able to use my foot without popping and cracking   NEXT MD VISIT: None scheduled   OBJECTIVE:   DIAGNOSTIC FINDINGS: NA  PATIENT SURVEYS:  FOTO 53% function   COGNITION: Overall cognitive status: Within functional limits for tasks assessed    PALPATION: Min TTP about Mid RT foot arch   LOWER EXTREMITY ROM:  Active ROM Right eval Left eval  Hip flexion    Hip extension    Hip abduction    Hip adduction    Hip internal rotation    Hip external rotation    Knee flexion    Knee extension    Ankle dorsiflexion 4   Ankle plantarflexion 46   Ankle inversion    Ankle eversion     (Blank rows = not tested)  LOWER EXTREMITY MMT:  MMT Right eval Left eval  Hip flexion    Hip extension    Hip abduction    Hip adduction    Hip internal rotation    Hip external rotation    Knee flexion    Knee extension    Ankle dorsiflexion 4   Ankle plantarflexion    Ankle inversion 4   Ankle eversion 4 P!    (Blank rows = not tested)   GAIT: Slightly antalgic, decreased stance on RT, no AD   TODAY'S TREATMENT:                                                                                                                              DATE:  08/14/22 Rec bike 4 min AROM  Ankle band 3 way (PF/ INV/ EV) BTB x 30 each BAPs Lv 2 x 30 DF/PF; INV/ EV; CW/ CCW Standing  calf stretch on slant board 3x 30 second holds Standing HR  x 20   Standing TR  x 20   Tandem stance 3 x 30 second holds bilateral on foam Rocker board PF/ DF; INV/EV 2 min each   Manual scar tissue massage and RT ankle PROM  08/11/22 Ankle circles x20 CW/ CCW Ankle band 3 way (PF/ INV/ EV) GTB x 30 each BAPs Lv 1 x 30 DF/PF; INV/ EV; CW/ CCW Standing calf stretch on slant board 3x 30 second holds Standing HR 1 x 10  Standing TR 1 x 10  Tandem stance 3 x 30 second holds bilateral  Lateral stepping with mini squat 8x 5 feet STS 1 x 10    08/06/22 Ankle circles x20 CW/ CCW Ankle band 3 way (PF/ INV/ EV) GTB x 30 each BAPs Lv 1 x 30 DF/PF; INV/ EV; CW/ CCW  Calf stretch seated with strap 3 x 30"   SLR 3 x 10 Sidelying hip abduction 3 x 10   Manual scar tissue massage and RT ankle PROM  08/04/22 Eval     PATIENT EDUCATION:  Education details: 08/11/22: HEP, gait mechanics, compression garments;  EVAL: on Eval findings, POC and HEP  Person educated: Patient Education method: Explanation Education comprehension: verbalized understanding  HOME EXERCISE PROGRAM: Access Code: [email or web address] URL: [email or web address] 08/11/22 - Heel Raises with Counter Support  - 3 x daily - 7 x weekly - 1-2 sets - 10 reps - Toe Raises with Counter Support  - 3 x daily - 7 x weekly - 1-2 sets - 10 reps - Tandem Stance  - 3 x daily - 7 x weekly - 3 reps - 30 second hold - Sit to Stand with Arms Crossed  - 3 x daily - 7 x weekly - 1-2 sets - 10 reps  08/06/22 - Straight Leg Raise  - 2-3 x daily - 7 x weekly - 3 sets - 10 reps - Sidelying Hip Abduction  - 2-3 x daily - 7 x weekly - 3 sets - 10 reps - Seated Calf Stretch with Strap  - 2-3 x daily - 7 x weekly - 1 sets - 3 reps - 30 second hold  Date: 08/04/2022 Prepared by: Josue Hector  Exercises - Seated Ankle Plantar Flexion with Resistance Loop  - 2-3 x daily - 7 x weekly - 3 sets - 10 reps - Seated Figure 4 Ankle Inversion with Resistance  - 2-3 x daily - 7 x weekly - 3 sets - 10 reps - Seated Ankle Eversion with  Resistance  - 2-3 x daily - 7 x weekly - 3 sets - 10 reps  ASSESSMENT:  CLINICAL IMPRESSION: Patient tolerated session well. Demos improving ankle stability with balance activity today and reports no increased complaints of pain during session. Increased band resistance to blue with good tolerance. Patient will continue to benefit from skilled therapy services to reduce remaining deficits and improve functional ability.     OBJECTIVE IMPAIRMENTS: Abnormal gait, decreased activity tolerance, decreased balance, decreased mobility, difficulty walking, decreased ROM, decreased strength, increased edema, increased fascial restrictions, impaired flexibility, improper body mechanics, and pain.   ACTIVITY LIMITATIONS: carrying, lifting, bending, standing, squatting, sleeping, stairs, transfers, and locomotion level  PARTICIPATION LIMITATIONS: meal prep, cleaning, laundry, driving, shopping, community activity, occupation, and yard work  PERSONAL FACTORS:  None  are also affecting patient's functional outcome.   REHAB POTENTIAL: Good  CLINICAL DECISION MAKING: Stable/uncomplicated  EVALUATION COMPLEXITY: Low   GOALS: SHORT TERM  GOALS: Target date: 08/25/2022  Patient will be independent with initial HEP and self-management strategies to improve functional outcomes Baseline:  Goal status: INITIAL    LONG TERM GOALS: Target date: 09/15/2022  Patient will be independent with advanced HEP and self-management strategies to improve functional outcomes Baseline:  Goal status: INITIAL  2.  Patient will improve FOTO score to predicted value to indicate improvement in functional outcomes Baseline: 53% Goal status: INITIAL  3.  Patient will have Rt ankle DF to at least 10 degrees to improve gait mechanics for improved LOF with ADLs and functional mobility. Baseline: 4 Goal status: INITIAL  4. Patient will have equal to or > 4+/5 MMT throughout RT ankle to improve ability to perform functional  mobility, stair ambulation and ADLs.  Baseline: See MMT Goal status: INITIAL   PLAN:  PT FREQUENCY: 1-2x/week  PT DURATION: 6 weeks  PLANNED INTERVENTIONS: Therapeutic exercises, Therapeutic activity, Neuromuscular re-education, Balance training, Gait training, Patient/Family education, Joint manipulation, Joint mobilization, Stair training, Aquatic Therapy, Dry Needling, Electrical stimulation, Spinal manipulation, Spinal mobilization, Cryotherapy, Moist heat, scar mobilization, Taping, Traction, Ultrasound, Biofeedback, Ionotophoresis '4mg'$ /ml Dexamethasone, and Manual therapy.   PLAN FOR NEXT SESSION: Progress ankle strength and stability as tolerated. Gait and balance when ready. Manual scar tissue massage and edema massage PRN  9:00 AM, 08/19/22 Josue Hector PT DPT  Physical Therapist with [facility]  [phone]

## 2022-08-15 DIAGNOSIS — J020 Streptococcal pharyngitis: Secondary | ICD-10-CM | POA: Diagnosis not present

## 2022-08-18 ENCOUNTER — Other Ambulatory Visit: Payer: Self-pay

## 2022-08-18 ENCOUNTER — Encounter: Payer: Self-pay | Admitting: Podiatry

## 2022-08-19 ENCOUNTER — Ambulatory Visit (HOSPITAL_COMMUNITY): Payer: 59 | Admitting: Physical Therapy

## 2022-08-19 ENCOUNTER — Encounter (HOSPITAL_COMMUNITY): Payer: Self-pay | Admitting: Physical Therapy

## 2022-08-19 DIAGNOSIS — R2689 Other abnormalities of gait and mobility: Secondary | ICD-10-CM | POA: Diagnosis not present

## 2022-08-19 DIAGNOSIS — M25571 Pain in right ankle and joints of right foot: Secondary | ICD-10-CM

## 2022-08-19 NOTE — Therapy (Signed)
OUTPATIENT PHYSICAL THERAPY LOWER EXTREMITY TREATMENT   Patient Name: Julie [name] MRN: [id number] DOB:1975-11-17, 47 y.o., female Today's Date: 08/19/2022  END OF SESSION:  PT End of Session - 08/19/22 0857     Visit Number 5    Number of Visits 12    Date for PT Re-Evaluation 09/15/22    Authorization Type [facility]    Progress Note Due on Visit 10    PT Start Time 0902    PT Stop Time 0943    PT Time Calculation (min) 41 min    Activity Tolerance Patient tolerated treatment well    Behavior During Therapy [facility] for tasks assessed/performed             Past Medical History:  Diagnosis Date   Anemia    Anemia    Anxiety    Breast discharge 07/01/2013   Pt noticed discharge, none on exam today will check TSH and Prolactin   Breast mass, right 10/20/2012   Depression    Dysmenorrhea 09/19/2015   Dyspareunia in female 09/19/2015   Ectopic pregnancy    Eczema    GERD (gastroesophageal reflux disease)    no meds   Headache    Hernia of abdominal wall 01/04/2013   Kidney stone    Left breast mass 04/12/2014   Has pea sized nodule at 6 o'clock tender and mobile, will get mammogram and [place] if needed   Menorrhagia with regular cycle 09/19/2015   Pelvic pain in female 09/19/2015   Placenta previa 2012   Current pregnancy    Urinary tract infection    Vaginal discharge 07/01/2013   Past Surgical History:  Procedure Laterality Date   ABDOMINAL HYSTERECTOMY N/A 10/10/2015   Procedure: HYSTERECTOMY ABDOMINAL;  Surgeon: Florian Buff, MD;  Location: [facility];  Service: Gynecology;  Laterality: N/A;   APPENDECTOMY     APPENDECTOMY     BILATERAL SALPINGECTOMY Bilateral 10/10/2015   Procedure: BILATERAL SALPINGECTOMY;  Surgeon: Florian Buff, MD;  Location: [facility];  Service: Gynecology;  Laterality: Bilateral;   BREAST BIOPSY Right 11/04/2012   hylanized tissue with minimal chronic inflammatory tissue   BREAST BIOPSY Right 07/16/2021   CESAREAN SECTION  04/24/2011    Procedure: CESAREAN SECTION;  Surgeon: Florian Buff, MD;  Location: [place] [facility];  Service: Gynecology;  Laterality: N/A;  Primary/Previa   CHOLECYSTECTOMY     COLONOSCOPY     CYSTOSCOPY W/ URETERAL STENT PLACEMENT Left 08/04/2014   Procedure: CYSTOSCOPY WITH LEFT  RETROGRADE PYELOGRAM/ LEFT URETERAL STENT PLACEMENT;  Surgeon: Festus Aloe, MD;  Location: [facility];  Service: Urology;  Laterality: Left;   TONSILLECTOMY     UPPER GI ENDOSCOPY     Patient Active Problem List   Diagnosis Date Noted   Change in bowel habit 06/25/2021   Colon cancer screening 06/25/2021   Dysphagia 06/25/2021   Gastroesophageal reflux disease 06/25/2021   Rectal bleeding 06/25/2021   Anxiety 01/04/2021   Depressive disorder 01/04/2021   Ovarian cyst 01/10/2019   S/P hysterectomy 10/10/2015   Dyspareunia in female 09/19/2015   Dysmenorrhea 09/19/2015   Left breast mass 04/12/2014   Hernia of abdominal wall 01/04/2013   Breast mass, right 10/20/2012    PCP: Sharilyn Sites MD   REFERRING PROVIDER: Garrel Ridgel, [place]  REFERRING DIAG: [phone] (ICD-10-CM) - Tear of peroneal tendon, right, subsequent encounter Z98.890 (ICD-10-CM) - Status post right foot surgery  THERAPY DIAG:  Pain in right ankle and joints of right foot  Other  abnormalities of gait and mobility  Rationale for Evaluation and Treatment: Rehabilitation  ONSET DATE: 05/17/23  SUBJECTIVE:   SUBJECTIVE STATEMENT: Ankle continues to click and pop. It is hurting some now. She describes it like a window that is off the track and needs popped back in place.    Eval: Patient presents to therapy with complaint of RT ankle pain. She had foot surgery 05/16/22. She is still having trouble with the way she walks. She is still having a lot of pain when walking. She states that sometimes it will "catch" and the pain is breathtaking. She is not taking pain medication at this time. She wears lace up ankle brace for stability.   PERTINENT HISTORY: RT  ankle surgery 05/16/22 PAIN:  Are you having pain? Yes: NPRS scale: 4/10 Pain location: Rt lateral ankle  Pain description: sharp, "breathtaking"  Aggravating factors: walking, WB  Relieving factors: non WB   PRECAUTIONS: None  WEIGHT BEARING RESTRICTIONS: No  FALLS:  Has patient fallen in last 6 months? Yes. Number of falls 1  LIVING ENVIRONMENT: Lives with: lives with their family and lives with their spouse Lives in: House/apartment Stairs: No Has following equipment at home: Crutches  OCCUPATION: CMA   PLOF: Independent  PATIENT GOALS: Be able to use my foot without popping and cracking   NEXT MD VISIT: None scheduled   OBJECTIVE:   DIAGNOSTIC FINDINGS: NA  PATIENT SURVEYS:  FOTO 53% function   COGNITION: Overall cognitive status: Within functional limits for tasks assessed    PALPATION: Min TTP about Mid RT foot arch   LOWER EXTREMITY ROM:  Active ROM Right eval Left eval  Hip flexion    Hip extension    Hip abduction    Hip adduction    Hip internal rotation    Hip external rotation    Knee flexion    Knee extension    Ankle dorsiflexion 4   Ankle plantarflexion 46   Ankle inversion    Ankle eversion     (Blank rows = not tested)  LOWER EXTREMITY MMT:  MMT Right eval Left eval  Hip flexion    Hip extension    Hip abduction    Hip adduction    Hip internal rotation    Hip external rotation    Knee flexion    Knee extension    Ankle dorsiflexion 4   Ankle plantarflexion    Ankle inversion 4   Ankle eversion 4 P!    (Blank rows = not tested)   GAIT: Slightly antalgic, decreased stance on RT, no AD   TODAY'S TREATMENT:                                                                                                                              DATE:  08/18/22 Rec bike 4 min AROM  BAPs (standing) Lv 1 x 30 DF/PF; INV/ EV; CW/ CCW Standing HR  x 20   Standing  TR  x 20  4 inch step up x20 4 inch step down x 20 +  Tandem stance 3  x 30 second on 1/2 foam roll SLS 3 x 15" (5 second max on RLE)  Retro walkouts 3 plates x10  K tape for Rt ankle evertor facilitation   08/14/22 Rec bike 4 min AROM  Ankle band 3 way (PF/ INV/ EV) BTB x 30 each BAPs Lv 2 x 30 DF/PF; INV/ EV; CW/ CCW Standing calf stretch on slant board 3x 30 second holds Standing HR  x 20   Standing TR  x 20  Tandem stance 3 x 30 second holds bilateral on foam Rocker board PF/ DF; INV/EV 2 min each   Manual scar tissue massage and RT ankle PROM  08/11/22 Ankle circles x20 CW/ CCW Ankle band 3 way (PF/ INV/ EV) GTB x 30 each BAPs Lv 1 x 30 DF/PF; INV/ EV; CW/ CCW Standing calf stretch on slant board 3x 30 second holds Standing HR 1 x 10  Standing TR 1 x 10  Tandem stance 3 x 30 second holds bilateral  Lateral stepping with mini squat 8x 5 feet STS 1 x 10    PATIENT EDUCATION:  Education details: 08/11/22: HEP, gait mechanics, compression garments;  EVAL: on Eval findings, POC and HEP  Person educated: Patient Education method: Explanation Education comprehension: verbalized understanding  HOME EXERCISE PROGRAM: Access Code: [email or web address] URL: [email or web address] 08/11/22 - Heel Raises with Counter Support  - 3 x daily - 7 x weekly - 1-2 sets - 10 reps - Toe Raises with Counter Support  - 3 x daily - 7 x weekly - 1-2 sets - 10 reps - Tandem Stance  - 3 x daily - 7 x weekly - 3 reps - 30 second hold - Sit to Stand with Arms Crossed  - 3 x daily - 7 x weekly - 1-2 sets - 10 reps  08/06/22 - Straight Leg Raise  - 2-3 x daily - 7 x weekly - 3 sets - 10 reps - Sidelying Hip Abduction  - 2-3 x daily - 7 x weekly - 3 sets - 10 reps - Seated Calf Stretch with Strap  - 2-3 x daily - 7 x weekly - 1 sets - 3 reps - 30 second hold  Date: 08/04/2022 Prepared by: Josue Hector  Exercises - Seated Ankle Plantar Flexion with Resistance Loop  - 2-3 x daily - 7 x weekly - 3 sets - 10 reps - Seated Figure 4 Ankle Inversion with Resistance  -  2-3 x daily - 7 x weekly - 3 sets - 10 reps - Seated Ankle Eversion with Resistance  - 2-3 x daily - 7 x weekly - 3 sets - 10 reps  ASSESSMENT:  CLINICAL IMPRESSION: Patient having ongoing difficulty with balance and noting slight increased pain with tandem balance. Patient tolerated all other activity well today. Trialed K tape for evertor support with good result. Patient noting improved mechanics with gait and decreased ankle pain with balance on 1/2 foam roll following K tape. Patient will continue to benefit from skilled therapy services to reduce remaining deficits and improve functional ability.      OBJECTIVE IMPAIRMENTS: Abnormal gait, decreased activity tolerance, decreased balance, decreased mobility, difficulty walking, decreased ROM, decreased strength, increased edema, increased fascial restrictions, impaired flexibility, improper body mechanics, and pain.   ACTIVITY LIMITATIONS: carrying, lifting, bending, standing, squatting, sleeping, stairs, transfers, and locomotion level  PARTICIPATION LIMITATIONS: meal prep, cleaning, laundry, driving,  shopping, community activity, occupation, and yard work  PERSONAL FACTORS:  None  are also affecting patient's functional outcome.   REHAB POTENTIAL: Good  CLINICAL DECISION MAKING: Stable/uncomplicated  EVALUATION COMPLEXITY: Low   GOALS: SHORT TERM GOALS: Target date: 08/25/2022  Patient will be independent with initial HEP and self-management strategies to improve functional outcomes Baseline:  Goal status: INITIAL    LONG TERM GOALS: Target date: 09/15/2022  Patient will be independent with advanced HEP and self-management strategies to improve functional outcomes Baseline:  Goal status: INITIAL  2.  Patient will improve FOTO score to predicted value to indicate improvement in functional outcomes Baseline: 53% Goal status: INITIAL  3.  Patient will have Rt ankle DF to at least 10 degrees to improve gait mechanics for  improved LOF with ADLs and functional mobility. Baseline: 4 Goal status: INITIAL  4. Patient will have equal to or > 4+/5 MMT throughout RT ankle to improve ability to perform functional mobility, stair ambulation and ADLs.  Baseline: See MMT Goal status: INITIAL   PLAN:  PT FREQUENCY: 1-2x/week  PT DURATION: 6 weeks  PLANNED INTERVENTIONS: Therapeutic exercises, Therapeutic activity, Neuromuscular re-education, Balance training, Gait training, Patient/Family education, Joint manipulation, Joint mobilization, Stair training, Aquatic Therapy, Dry Needling, Electrical stimulation, Spinal manipulation, Spinal mobilization, Cryotherapy, Moist heat, scar mobilization, Taping, Traction, Ultrasound, Biofeedback, Ionotophoresis '4mg'$ /ml Dexamethasone, and Manual therapy.   PLAN FOR NEXT SESSION: Follow up on K tape. Progress ankle strength and stability as tolerated. Gait and balance when ready. Manual scar tissue massage and edema massage PRN  9:46 AM, 08/19/22 Josue Hector PT DPT  Physical Therapist with [facility]  [phone]

## 2022-08-21 ENCOUNTER — Ambulatory Visit (HOSPITAL_COMMUNITY): Payer: 59 | Admitting: Physical Therapy

## 2022-08-21 DIAGNOSIS — R2689 Other abnormalities of gait and mobility: Secondary | ICD-10-CM

## 2022-08-21 DIAGNOSIS — M25571 Pain in right ankle and joints of right foot: Secondary | ICD-10-CM | POA: Diagnosis not present

## 2022-08-21 NOTE — Therapy (Signed)
OUTPATIENT PHYSICAL THERAPY LOWER EXTREMITY TREATMENT   Patient Name: Julie [name] MRN: [id number] DOB:1976/03/10, 47 y.o., female Today's Date: 08/21/2022  END OF SESSION:  PT End of Session - 08/21/22 0819     Visit Number 6    Number of Visits 12    Date for PT Re-Evaluation 09/15/22    Authorization Type [facility]    Progress Note Due on Visit 10    PT Start Time 0818    PT Stop Time 0858    PT Time Calculation (min) 40 min    Activity Tolerance Patient tolerated treatment well    Behavior During Therapy [facility] for tasks assessed/performed             Past Medical History:  Diagnosis Date   Anemia    Anemia    Anxiety    Breast discharge 07/01/2013   Pt noticed discharge, none on exam today will check TSH and Prolactin   Breast mass, right 10/20/2012   Depression    Dysmenorrhea 09/19/2015   Dyspareunia in female 09/19/2015   Ectopic pregnancy    Eczema    GERD (gastroesophageal reflux disease)    no meds   Headache    Hernia of abdominal wall 01/04/2013   Kidney stone    Left breast mass 04/12/2014   Has pea sized nodule at 6 o'clock tender and mobile, will get mammogram and [place] if needed   Menorrhagia with regular cycle 09/19/2015   Pelvic pain in female 09/19/2015   Placenta previa 2012   Current pregnancy    Urinary tract infection    Vaginal discharge 07/01/2013   Past Surgical History:  Procedure Laterality Date   ABDOMINAL HYSTERECTOMY N/A 10/10/2015   Procedure: HYSTERECTOMY ABDOMINAL;  Surgeon: Florian Buff, MD;  Location: [facility];  Service: Gynecology;  Laterality: N/A;   APPENDECTOMY     APPENDECTOMY     BILATERAL SALPINGECTOMY Bilateral 10/10/2015   Procedure: BILATERAL SALPINGECTOMY;  Surgeon: Florian Buff, MD;  Location: [facility];  Service: Gynecology;  Laterality: Bilateral;   BREAST BIOPSY Right 11/04/2012   hylanized tissue with minimal chronic inflammatory tissue   BREAST BIOPSY Right 07/16/2021   CESAREAN SECTION  04/24/2011    Procedure: CESAREAN SECTION;  Surgeon: Florian Buff, MD;  Location: [place] [facility];  Service: Gynecology;  Laterality: N/A;  Primary/Previa   CHOLECYSTECTOMY     COLONOSCOPY     CYSTOSCOPY W/ URETERAL STENT PLACEMENT Left 08/04/2014   Procedure: CYSTOSCOPY WITH LEFT  RETROGRADE PYELOGRAM/ LEFT URETERAL STENT PLACEMENT;  Surgeon: Festus Aloe, MD;  Location: [facility];  Service: Urology;  Laterality: Left;   TONSILLECTOMY     UPPER GI ENDOSCOPY     Patient Active Problem List   Diagnosis Date Noted   Change in bowel habit 06/25/2021   Colon cancer screening 06/25/2021   Dysphagia 06/25/2021   Gastroesophageal reflux disease 06/25/2021   Rectal bleeding 06/25/2021   Anxiety 01/04/2021   Depressive disorder 01/04/2021   Ovarian cyst 01/10/2019   S/P hysterectomy 10/10/2015   Dyspareunia in female 09/19/2015   Dysmenorrhea 09/19/2015   Left breast mass 04/12/2014   Hernia of abdominal wall 01/04/2013   Breast mass, right 10/20/2012    PCP: Sharilyn Sites MD   REFERRING PROVIDER: Garrel Ridgel, [place]  REFERRING DIAG: [phone] (ICD-10-CM) - Tear of peroneal tendon, right, subsequent encounter Z98.890 (ICD-10-CM) - Status post right foot surgery  THERAPY DIAG:  Pain in right ankle and joints of right foot  Other  abnormalities of gait and mobility  Rationale for Evaluation and Treatment: Rehabilitation  ONSET DATE: 05/17/23  SUBJECTIVE:   SUBJECTIVE STATEMENT: Tape was helpful. Came off last night.    Eval: Patient presents to therapy with complaint of RT ankle pain. She had foot surgery 05/16/22. She is still having trouble with the way she walks. She is still having a lot of pain when walking. She states that sometimes it will "catch" and the pain is breathtaking. She is not taking pain medication at this time. She wears lace up ankle brace for stability.   PERTINENT HISTORY: RT ankle surgery 05/16/22 PAIN:  Are you having pain? Yes: NPRS scale: 0/10 Pain location: Rt lateral ankle   Pain description: sharp, "breathtaking"  Aggravating factors: walking, WB  Relieving factors: non WB   PRECAUTIONS: None  WEIGHT BEARING RESTRICTIONS: No  FALLS:  Has patient fallen in last 6 months? Yes. Number of falls 1  LIVING ENVIRONMENT: Lives with: lives with their family and lives with their spouse Lives in: House/apartment Stairs: No Has following equipment at home: Crutches  OCCUPATION: CMA   PLOF: Independent  PATIENT GOALS: Be able to use my foot without popping and cracking   NEXT MD VISIT: None scheduled   OBJECTIVE:   DIAGNOSTIC FINDINGS: NA  PATIENT SURVEYS:  FOTO 53% function   COGNITION: Overall cognitive status: Within functional limits for tasks assessed    PALPATION: Min TTP about Mid RT foot arch   LOWER EXTREMITY ROM:  Active ROM Right eval Left eval  Hip flexion    Hip extension    Hip abduction    Hip adduction    Hip internal rotation    Hip external rotation    Knee flexion    Knee extension    Ankle dorsiflexion 4   Ankle plantarflexion 46   Ankle inversion    Ankle eversion     (Blank rows = not tested)  LOWER EXTREMITY MMT:  MMT Right eval Left eval  Hip flexion    Hip extension    Hip abduction    Hip adduction    Hip internal rotation    Hip external rotation    Knee flexion    Knee extension    Ankle dorsiflexion 4   Ankle plantarflexion    Ankle inversion 4   Ankle eversion 4 P!    (Blank rows = not tested)   GAIT: Slightly antalgic, decreased stance on RT, no AD   TODAY'S TREATMENT:                                                                                                                              DATE:  08/21/22 Rec bike 4 min AROM  Slant board 3 x 20" BAPs (standing) Lv 1 x 30 DF/PF; INV/ EV; CW/ CCW Standing HR  x 20   Standing TR  x 20   4 inch step up x20 4 inch step down x  20 SLS 3 x 15" on foam  Retro walkouts 3 plates x10 Standing hip abduction GTB x 20 Standing hip  extension GTB x 20   K tape for Rt ankle evertor facilitation   08/18/22 Rec bike 4 min AROM  BAPs (standing) Lv 1 x 30 DF/PF; INV/ EV; CW/ CCW Standing HR  x 20   Standing TR  x 20  4 inch step up x20 4 inch step down x 20   Tandem stance 3 x 30 second on 1/2 foam roll SLS 3 x 15" (5 second max on RLE)  Retro walkouts 3 plates x10  K tape for Rt ankle evertor facilitation   08/14/22 Rec bike 4 min AROM  Ankle band 3 way (PF/ INV/ EV) BTB x 30 each BAPs Lv 2 x 30 DF/PF; INV/ EV; CW/ CCW Standing calf stretch on slant board 3x 30 second holds Standing HR  x 20   Standing TR  x 20  Tandem stance 3 x 30 second holds bilateral on foam Rocker board PF/ DF; INV/EV 2 min each   Manual scar tissue massage and RT ankle PROM  08/11/22 Ankle circles x20 CW/ CCW Ankle band 3 way (PF/ INV/ EV) GTB x 30 each BAPs Lv 1 x 30 DF/PF; INV/ EV; CW/ CCW Standing calf stretch on slant board 3x 30 second holds Standing HR 1 x 10  Standing TR 1 x 10  Tandem stance 3 x 30 second holds bilateral  Lateral stepping with mini squat 8x 5 feet STS 1 x 10    PATIENT EDUCATION:  Education details: 08/11/22: HEP, gait mechanics, compression garments;  EVAL: on Eval findings, POC and HEP  Person educated: Patient Education method: Explanation Education comprehension: verbalized understanding  HOME EXERCISE PROGRAM: Access Code: [email or web address] URL: [email or web address]  08/20/21 - Hip Abduction with Resistance Loop  - 2 x daily - 7 x weekly - 2 sets - 10 reps - Standing Hip Extension Kicks  - 2 x daily - 7 x weekly - 2 sets - 10 reps  08/11/22 - Heel Raises with Counter Support  - 3 x daily - 7 x weekly - 1-2 sets - 10 reps - Toe Raises with Counter Support  - 3 x daily - 7 x weekly - 1-2 sets - 10 reps - Tandem Stance  - 3 x daily - 7 x weekly - 3 reps - 30 second hold - Sit to Stand with Arms Crossed  - 3 x daily - 7 x weekly - 1-2 sets - 10 reps  08/06/22 - Straight Leg Raise  - 2-3 x  daily - 7 x weekly - 3 sets - 10 reps - Sidelying Hip Abduction  - 2-3 x daily - 7 x weekly - 3 sets - 10 reps - Seated Calf Stretch with Strap  - 2-3 x daily - 7 x weekly - 1 sets - 3 reps - 30 second hold  Date: 08/04/2022 Prepared by: Josue Hector  Exercises - Seated Ankle Plantar Flexion with Resistance Loop  - 2-3 x daily - 7 x weekly - 3 sets - 10 reps - Seated Figure 4 Ankle Inversion with Resistance  - 2-3 x daily - 7 x weekly - 3 sets - 10 reps - Seated Ankle Eversion with Resistance  - 2-3 x daily - 7 x weekly - 3 sets - 10 reps  ASSESSMENT:  CLINICAL IMPRESSION: Patient tolerated session well overall. Continues to have most difficulty balancing in single limb on RLE. Progressed  LE strengthening with added hip abduction and extension. Showing good return with 4 inch steps. Continues to be limited by pain and decreased balance. Patient will continue to benefit from skilled therapy services to reduce remaining deficits and improve functional ability.     OBJECTIVE IMPAIRMENTS: Abnormal gait, decreased activity tolerance, decreased balance, decreased mobility, difficulty walking, decreased ROM, decreased strength, increased edema, increased fascial restrictions, impaired flexibility, improper body mechanics, and pain.   ACTIVITY LIMITATIONS: carrying, lifting, bending, standing, squatting, sleeping, stairs, transfers, and locomotion level  PARTICIPATION LIMITATIONS: meal prep, cleaning, laundry, driving, shopping, community activity, occupation, and yard work  PERSONAL FACTORS:  None  are also affecting patient's functional outcome.   REHAB POTENTIAL: Good  CLINICAL DECISION MAKING: Stable/uncomplicated  EVALUATION COMPLEXITY: Low   GOALS: SHORT TERM GOALS: Target date: 08/25/2022  Patient will be independent with initial HEP and self-management strategies to improve functional outcomes Baseline:  Goal status: INITIAL    LONG TERM GOALS: Target date:  09/15/2022  Patient will be independent with advanced HEP and self-management strategies to improve functional outcomes Baseline:  Goal status: INITIAL  2.  Patient will improve FOTO score to predicted value to indicate improvement in functional outcomes Baseline: 53% Goal status: INITIAL  3.  Patient will have Rt ankle DF to at least 10 degrees to improve gait mechanics for improved LOF with ADLs and functional mobility. Baseline: 4 Goal status: INITIAL  4. Patient will have equal to or > 4+/5 MMT throughout RT ankle to improve ability to perform functional mobility, stair ambulation and ADLs.  Baseline: See MMT Goal status: INITIAL   PLAN:  PT FREQUENCY: 1-2x/week  PT DURATION: 6 weeks  PLANNED INTERVENTIONS: Therapeutic exercises, Therapeutic activity, Neuromuscular re-education, Balance training, Gait training, Patient/Family education, Joint manipulation, Joint mobilization, Stair training, Aquatic Therapy, Dry Needling, Electrical stimulation, Spinal manipulation, Spinal mobilization, Cryotherapy, Moist heat, scar mobilization, Taping, Traction, Ultrasound, Biofeedback, Ionotophoresis '4mg'$ /ml Dexamethasone, and Manual therapy.   PLAN FOR NEXT SESSION: Increase step height. Continue balance work. Rockerboard. Progress ankle strength and stability as tolerated. Gait and balance when ready. Manual scar tissue massage and edema massage PRN  8:59 AM, 08/21/22 Josue Hector PT DPT  Physical Therapist with [facility]  [phone]

## 2022-08-25 ENCOUNTER — Ambulatory Visit (HOSPITAL_COMMUNITY): Payer: 59

## 2022-08-25 DIAGNOSIS — R2689 Other abnormalities of gait and mobility: Secondary | ICD-10-CM

## 2022-08-25 DIAGNOSIS — M25571 Pain in right ankle and joints of right foot: Secondary | ICD-10-CM

## 2022-08-25 NOTE — Therapy (Signed)
OUTPATIENT PHYSICAL THERAPY LOWER EXTREMITY TREATMENT   Patient Name: Julie [name] MRN: [id number] DOB:02-27-1976, 47 y.o., female Today's Date: 08/25/2022  END OF SESSION:  PT End of Session - 08/25/22 0903     Visit Number 7    Number of Visits 12    Date for PT Re-Evaluation 09/15/22    Authorization Type [facility]    Progress Note Due on Visit 10    PT Start Time 0902    PT Stop Time 0942    PT Time Calculation (min) 40 min    Activity Tolerance Patient tolerated treatment well    Behavior During Therapy [facility] for tasks assessed/performed             Past Medical History:  Diagnosis Date   Anemia    Anemia    Anxiety    Breast discharge 07/01/2013   Pt noticed discharge, none on exam today will check TSH and Prolactin   Breast mass, right 10/20/2012   Depression    Dysmenorrhea 09/19/2015   Dyspareunia in female 09/19/2015   Ectopic pregnancy    Eczema    GERD (gastroesophageal reflux disease)    no meds   Headache    Hernia of abdominal wall 01/04/2013   Kidney stone    Left breast mass 04/12/2014   Has pea sized nodule at 6 o'clock tender and mobile, will get mammogram and [place] if needed   Menorrhagia with regular cycle 09/19/2015   Pelvic pain in female 09/19/2015   Placenta previa 2012   Current pregnancy    Urinary tract infection    Vaginal discharge 07/01/2013   Past Surgical History:  Procedure Laterality Date   ABDOMINAL HYSTERECTOMY N/A 10/10/2015   Procedure: HYSTERECTOMY ABDOMINAL;  Surgeon: Florian Buff, MD;  Location: [facility];  Service: Gynecology;  Laterality: N/A;   APPENDECTOMY     APPENDECTOMY     BILATERAL SALPINGECTOMY Bilateral 10/10/2015   Procedure: BILATERAL SALPINGECTOMY;  Surgeon: Florian Buff, MD;  Location: [facility];  Service: Gynecology;  Laterality: Bilateral;   BREAST BIOPSY Right 11/04/2012   hylanized tissue with minimal chronic inflammatory tissue   BREAST BIOPSY Right 07/16/2021   CESAREAN SECTION  04/24/2011    Procedure: CESAREAN SECTION;  Surgeon: Florian Buff, MD;  Location: [place] [facility];  Service: Gynecology;  Laterality: N/A;  Primary/Previa   CHOLECYSTECTOMY     COLONOSCOPY     CYSTOSCOPY W/ URETERAL STENT PLACEMENT Left 08/04/2014   Procedure: CYSTOSCOPY WITH LEFT  RETROGRADE PYELOGRAM/ LEFT URETERAL STENT PLACEMENT;  Surgeon: Festus Aloe, MD;  Location: [facility];  Service: Urology;  Laterality: Left;   TONSILLECTOMY     UPPER GI ENDOSCOPY     Patient Active Problem List   Diagnosis Date Noted   Change in bowel habit 06/25/2021   Colon cancer screening 06/25/2021   Dysphagia 06/25/2021   Gastroesophageal reflux disease 06/25/2021   Rectal bleeding 06/25/2021   Anxiety 01/04/2021   Depressive disorder 01/04/2021   Ovarian cyst 01/10/2019   S/P hysterectomy 10/10/2015   Dyspareunia in female 09/19/2015   Dysmenorrhea 09/19/2015   Left breast mass 04/12/2014   Hernia of abdominal wall 01/04/2013   Breast mass, right 10/20/2012    PCP: Sharilyn Sites MD   REFERRING PROVIDER: Garrel Ridgel, [place]  REFERRING DIAG: [phone] (ICD-10-CM) - Tear of peroneal tendon, right, subsequent encounter Z98.890 (ICD-10-CM) - Status post right foot surgery  THERAPY DIAG:  Pain in right ankle and joints of right foot  Other  abnormalities of gait and mobility  Rationale for Evaluation and Treatment: Rehabilitation  ONSET DATE: 05/17/23  SUBJECTIVE:   SUBJECTIVE STATEMENT: Pain when she first stood up and when she walks a lot.  Went to the beach this past weekend and [place] [employer] and her foot and ankle and lower leg were swollen after walking. Pain comes and goes; sometimes "clicks" when she first stands up  Eval: Patient presents to therapy with complaint of RT ankle pain. She had foot surgery 05/16/22. She is still having trouble with the way she walks. She is still having a lot of pain when walking. She states that sometimes it will "catch" and the pain is breathtaking. She is not taking  pain medication at this time. She wears lace up ankle brace for stability.   PERTINENT HISTORY: RT ankle surgery 05/16/22 PAIN:  Are you having pain? Yes: NPRS scale: 0/10 Pain location: Rt lateral ankle  Pain description: sharp, "breathtaking"  Aggravating factors: walking, WB  Relieving factors: non WB   PRECAUTIONS: None  WEIGHT BEARING RESTRICTIONS: No  FALLS:  Has patient fallen in last 6 months? Yes. Number of falls 1  LIVING ENVIRONMENT: Lives with: lives with their family and lives with their spouse Lives in: House/apartment Stairs: No Has following equipment at home: Crutches  OCCUPATION: CMA   PLOF: Independent  PATIENT GOALS: Be able to use my foot without popping and cracking   NEXT MD VISIT: None scheduled   OBJECTIVE:   DIAGNOSTIC FINDINGS: NA  PATIENT SURVEYS:  FOTO 53% function   COGNITION: Overall cognitive status: Within functional limits for tasks assessed    PALPATION: Min TTP about Mid RT foot arch   LOWER EXTREMITY ROM:  Active ROM Right eval Left eval  Hip flexion    Hip extension    Hip abduction    Hip adduction    Hip internal rotation    Hip external rotation    Knee flexion    Knee extension    Ankle dorsiflexion 4   Ankle plantarflexion 46   Ankle inversion    Ankle eversion     (Blank rows = not tested)  LOWER EXTREMITY MMT:  MMT Right eval Left eval  Hip flexion    Hip extension    Hip abduction    Hip adduction    Hip internal rotation    Hip external rotation    Knee flexion    Knee extension    Ankle dorsiflexion 4   Ankle plantarflexion    Ankle inversion 4   Ankle eversion 4 P!    (Blank rows = not tested)   GAIT: Slightly antalgic, decreased stance on RT, no AD   TODAY'S TREATMENT:                                                                                                                              DATE:  08/25/22 Recumbent bike x 5 min dynamic  warm up  Standing: Heel/toe raises on  incline x 20 Slant board 5 x 20" 6" box step ups x 20 4" box step downs x 20 BAPS level 3 CW and CCW x 1' each Rocker board F/B and S/S 2 x 1' each GTB hip abduction 2 x 10 GTB hip extension 2 x 10  Kinesiotape right lateral foot/ankle following peroneals to promote improved muscle activation     08/21/22 Rec bike 4 min AROM  Slant board 3 x 20" BAPs (standing) Lv 1 x 30 DF/PF; INV/ EV; CW/ CCW Standing HR  x 20   Standing TR  x 20   4 inch step up x20 4 inch step down x 20 SLS 3 x 15" on foam  Retro walkouts 3 plates x10 Standing hip abduction GTB x 20 Standing hip extension GTB x 20   K tape for Rt ankle evertor facilitation   08/18/22 Rec bike 4 min AROM  BAPs (standing) Lv 1 x 30 DF/PF; INV/ EV; CW/ CCW Standing HR  x 20   Standing TR  x 20  4 inch step up x20 4 inch step down x 20   Tandem stance 3 x 30 second on 1/2 foam roll SLS 3 x 15" (5 second max on RLE)  Retro walkouts 3 plates x10  K tape for Rt ankle evertor facilitation   08/14/22 Rec bike 4 min AROM  Ankle band 3 way (PF/ INV/ EV) BTB x 30 each BAPs Lv 2 x 30 DF/PF; INV/ EV; CW/ CCW Standing calf stretch on slant board 3x 30 second holds Standing HR  x 20   Standing TR  x 20  Tandem stance 3 x 30 second holds bilateral on foam Rocker board PF/ DF; INV/EV 2 min each   Manual scar tissue massage and RT ankle PROM  08/11/22 Ankle circles x20 CW/ CCW Ankle band 3 way (PF/ INV/ EV) GTB x 30 each BAPs Lv 1 x 30 DF/PF; INV/ EV; CW/ CCW Standing calf stretch on slant board 3x 30 second holds Standing HR 1 x 10  Standing TR 1 x 10  Tandem stance 3 x 30 second holds bilateral  Lateral stepping with mini squat 8x 5 feet STS 1 x 10    PATIENT EDUCATION:  Education details: 08/11/22: HEP, gait mechanics, compression garments;  EVAL: on Eval findings, POC and HEP  Person educated: Patient Education method: Explanation Education comprehension: verbalized understanding  HOME EXERCISE PROGRAM: Access  Code: [email or web address] URL: [email or web address]  08/20/21 - Hip Abduction with Resistance Loop  - 2 x daily - 7 x weekly - 2 sets - 10 reps - Standing Hip Extension Kicks  - 2 x daily - 7 x weekly - 2 sets - 10 reps  08/11/22 - Heel Raises with Counter Support  - 3 x daily - 7 x weekly - 1-2 sets - 10 reps - Toe Raises with Counter Support  - 3 x daily - 7 x weekly - 1-2 sets - 10 reps - Tandem Stance  - 3 x daily - 7 x weekly - 3 reps - 30 second hold - Sit to Stand with Arms Crossed  - 3 x daily - 7 x weekly - 1-2 sets - 10 reps  08/06/22 - Straight Leg Raise  - 2-3 x daily - 7 x weekly - 3 sets - 10 reps - Sidelying Hip Abduction  - 2-3 x daily - 7 x weekly - 3 sets - 10 reps - Seated Calf  Stretch with Strap  - 2-3 x daily - 7 x weekly - 1 sets - 3 reps - 30 second hold  Date: 08/04/2022 Prepared by: Josue Hector  Exercises - Seated Ankle Plantar Flexion with Resistance Loop  - 2-3 x daily - 7 x weekly - 3 sets - 10 reps - Seated Figure 4 Ankle Inversion with Resistance  - 2-3 x daily - 7 x weekly - 3 sets - 10 reps - Seated Ankle Eversion with Resistance  - 2-3 x daily - 7 x weekly - 3 sets - 10 reps  ASSESSMENT:  CLINICAL IMPRESSION: Today's session continued to address right ankle strength and mobility.  Increased step height today to 6" without issue. Added rocker board to promote equal weight bearing and equal weight shifting.Noted continued swelling right foot and ankle laterally.    Patient will continue to benefit from skilled therapy services to reduce remaining deficits and improve functional ability.     OBJECTIVE IMPAIRMENTS: Abnormal gait, decreased activity tolerance, decreased balance, decreased mobility, difficulty walking, decreased ROM, decreased strength, increased edema, increased fascial restrictions, impaired flexibility, improper body mechanics, and pain.   ACTIVITY LIMITATIONS: carrying, lifting, bending, standing, squatting, sleeping, stairs,  transfers, and locomotion level  PARTICIPATION LIMITATIONS: meal prep, cleaning, laundry, driving, shopping, community activity, occupation, and yard work  PERSONAL FACTORS:  None  are also affecting patient's functional outcome.   REHAB POTENTIAL: Good  CLINICAL DECISION MAKING: Stable/uncomplicated  EVALUATION COMPLEXITY: Low   GOALS: SHORT TERM GOALS: Target date: 08/25/2022  Patient will be independent with initial HEP and self-management strategies to improve functional outcomes Baseline:  Goal status: INITIAL    LONG TERM GOALS: Target date: 09/15/2022  Patient will be independent with advanced HEP and self-management strategies to improve functional outcomes Baseline:  Goal status: INITIAL  2.  Patient will improve FOTO score to predicted value to indicate improvement in functional outcomes Baseline: 53% Goal status: INITIAL  3.  Patient will have Rt ankle DF to at least 10 degrees to improve gait mechanics for improved LOF with ADLs and functional mobility. Baseline: 4 Goal status: INITIAL  4. Patient will have equal to or > 4+/5 MMT throughout RT ankle to improve ability to perform functional mobility, stair ambulation and ADLs.  Baseline: See MMT Goal status: INITIAL   PLAN:  PT FREQUENCY: 1-2x/week  PT DURATION: 6 weeks  PLANNED INTERVENTIONS: Therapeutic exercises, Therapeutic activity, Neuromuscular re-education, Balance training, Gait training, Patient/Family education, Joint manipulation, Joint mobilization, Stair training, Aquatic Therapy, Dry Needling, Electrical stimulation, Spinal manipulation, Spinal mobilization, Cryotherapy, Moist heat, scar mobilization, Taping, Traction, Ultrasound, Biofeedback, Ionotophoresis 4mg /ml Dexamethasone, and Manual therapy.   PLAN FOR NEXT SESSION: Increase step height. Continue balance work. Rockerboard. Progress ankle strength and stability as tolerated. Gait and balance when ready. Manual scar tissue massage and  edema massage PRN  9:45 AM, 08/25/22 Albertus Chiarelli Small Jevaeh Shams MPT [facility] [place] [phone] [id number]

## 2022-08-26 ENCOUNTER — Ambulatory Visit (HOSPITAL_COMMUNITY): Payer: 59 | Admitting: Physical Therapy

## 2022-08-26 DIAGNOSIS — M25571 Pain in right ankle and joints of right foot: Secondary | ICD-10-CM

## 2022-08-26 DIAGNOSIS — R2689 Other abnormalities of gait and mobility: Secondary | ICD-10-CM | POA: Diagnosis not present

## 2022-08-26 NOTE — Therapy (Signed)
OUTPATIENT PHYSICAL THERAPY LOWER EXTREMITY TREATMENT   Patient Name: Julie [name] MRN: [id number] DOB:March 01, 1976, 47 y.o., female Today's Date: 08/26/2022  END OF SESSION:  PT End of Session - 08/26/22 0818     Visit Number 8    Number of Visits 12    Date for PT Re-Evaluation 09/15/22    Authorization Type [facility]    Progress Note Due on Visit 10    PT Start Time 0820    PT Stop Time 0858    PT Time Calculation (min) 38 min    Activity Tolerance Patient tolerated treatment well    Behavior During Therapy [facility] for tasks assessed/performed             Past Medical History:  Diagnosis Date   Anemia    Anemia    Anxiety    Breast discharge 07/01/2013   Pt noticed discharge, none on exam today will check TSH and Prolactin   Breast mass, right 10/20/2012   Depression    Dysmenorrhea 09/19/2015   Dyspareunia in female 09/19/2015   Ectopic pregnancy    Eczema    GERD (gastroesophageal reflux disease)    no meds   Headache    Hernia of abdominal wall 01/04/2013   Kidney stone    Left breast mass 04/12/2014   Has pea sized nodule at 6 o'clock tender and mobile, will get mammogram and [place] if needed   Menorrhagia with regular cycle 09/19/2015   Pelvic pain in female 09/19/2015   Placenta previa 2012   Current pregnancy    Urinary tract infection    Vaginal discharge 07/01/2013   Past Surgical History:  Procedure Laterality Date   ABDOMINAL HYSTERECTOMY N/A 10/10/2015   Procedure: HYSTERECTOMY ABDOMINAL;  Surgeon: Florian Buff, MD;  Location: [facility];  Service: Gynecology;  Laterality: N/A;   APPENDECTOMY     APPENDECTOMY     BILATERAL SALPINGECTOMY Bilateral 10/10/2015   Procedure: BILATERAL SALPINGECTOMY;  Surgeon: Florian Buff, MD;  Location: [facility];  Service: Gynecology;  Laterality: Bilateral;   BREAST BIOPSY Right 11/04/2012   hylanized tissue with minimal chronic inflammatory tissue   BREAST BIOPSY Right 07/16/2021   CESAREAN SECTION  04/24/2011    Procedure: CESAREAN SECTION;  Surgeon: Florian Buff, MD;  Location: [place] [facility];  Service: Gynecology;  Laterality: N/A;  Primary/Previa   CHOLECYSTECTOMY     COLONOSCOPY     CYSTOSCOPY W/ URETERAL STENT PLACEMENT Left 08/04/2014   Procedure: CYSTOSCOPY WITH LEFT  RETROGRADE PYELOGRAM/ LEFT URETERAL STENT PLACEMENT;  Surgeon: Festus Aloe, MD;  Location: [facility];  Service: Urology;  Laterality: Left;   TONSILLECTOMY     UPPER GI ENDOSCOPY     Patient Active Problem List   Diagnosis Date Noted   Change in bowel habit 06/25/2021   Colon cancer screening 06/25/2021   Dysphagia 06/25/2021   Gastroesophageal reflux disease 06/25/2021   Rectal bleeding 06/25/2021   Anxiety 01/04/2021   Depressive disorder 01/04/2021   Ovarian cyst 01/10/2019   S/P hysterectomy 10/10/2015   Dyspareunia in female 09/19/2015   Dysmenorrhea 09/19/2015   Left breast mass 04/12/2014   Hernia of abdominal wall 01/04/2013   Breast mass, right 10/20/2012    PCP: Sharilyn Sites MD   REFERRING PROVIDER: Garrel Ridgel, [place]  REFERRING DIAG: [phone] (ICD-10-CM) - Tear of peroneal tendon, right, subsequent encounter Z98.890 (ICD-10-CM) - Status post right foot surgery  THERAPY DIAG:  Pain in right ankle and joints of right foot  Other  abnormalities of gait and mobility  Rationale for Evaluation and Treatment: Rehabilitation  ONSET DATE: 05/17/23  SUBJECTIVE:   SUBJECTIVE STATEMENT: Nothing new since yesterday.   Eval: Patient presents to therapy with complaint of RT ankle pain. She had foot surgery 05/16/22. She is still having trouble with the way she walks. She is still having a lot of pain when walking. She states that sometimes it will "catch" and the pain is breathtaking. She is not taking pain medication at this time. She wears lace up ankle brace for stability.   PERTINENT HISTORY: RT ankle surgery 05/16/22 PAIN:  Are you having pain? Yes: NPRS scale: 1/10 Pain location: Rt lateral ankle  Pain  description: sharp, "breathtaking"  Aggravating factors: walking, WB  Relieving factors: non WB   PRECAUTIONS: None  WEIGHT BEARING RESTRICTIONS: No  FALLS:  Has patient fallen in last 6 months? Yes. Number of falls 1  LIVING ENVIRONMENT: Lives with: lives with their family and lives with their spouse Lives in: House/apartment Stairs: No Has following equipment at home: Crutches  OCCUPATION: CMA   PLOF: Independent  PATIENT GOALS: Be able to use my foot without popping and cracking   NEXT MD VISIT: None scheduled   OBJECTIVE:   DIAGNOSTIC FINDINGS: NA  PATIENT SURVEYS:  FOTO 53% function   COGNITION: Overall cognitive status: Within functional limits for tasks assessed    PALPATION: Min TTP about Mid RT foot arch   LOWER EXTREMITY ROM:  Active ROM Right eval Left eval  Hip flexion    Hip extension    Hip abduction    Hip adduction    Hip internal rotation    Hip external rotation    Knee flexion    Knee extension    Ankle dorsiflexion 4   Ankle plantarflexion 46   Ankle inversion    Ankle eversion     (Blank rows = not tested)  LOWER EXTREMITY MMT:  MMT Right eval Left eval  Hip flexion    Hip extension    Hip abduction    Hip adduction    Hip internal rotation    Hip external rotation    Knee flexion    Knee extension    Ankle dorsiflexion 4   Ankle plantarflexion    Ankle inversion 4   Ankle eversion 4 P!    (Blank rows = not tested)   GAIT: Slightly antalgic, decreased stance on RT, no AD   TODAY'S TREATMENT:                                                                                                                              DATE:  08/26/22 Recumbent bike x 5 min dynamic warm up  Standing: Heel/toe raises on incline x 20 Slant board 3 x 20" 4 inch stairs 5 RT no rails, reciprocal pattern  BAPS level 3 CW and CCW x 1' each GTB band sidestepping 3 RT  Machine walkout 4 plates  x 10  Tandem stance on foam 3 x 30"  SLS 3  x 10"    08/25/22 Recumbent bike x 5 min dynamic warm up  Standing: Heel/toe raises on incline x 20 Slant board 5 x 20" 6" box step ups x 20 4" box step downs x 20 BAPS level 3 CW and CCW x 1' each Rocker board F/B and S/S 2 x 1' each GTB hip abduction 2 x 10 GTB hip extension 2 x 10  Kinesiotape right lateral foot/ankle following peroneals to promote improved muscle activation  08/21/22 Rec bike 4 min AROM  Slant board 3 x 20" BAPs (standing) Lv 1 x 30 DF/PF; INV/ EV; CW/ CCW Standing HR  x 20   Standing TR  x 20   4 inch step up x20 4 inch step down x 20 SLS 3 x 15" on foam  Retro walkouts 3 plates x10 Standing hip abduction GTB x 20 Standing hip extension GTB x 20   K tape for Rt ankle evertor facilitation   PATIENT EDUCATION:  Education details: 08/11/22: HEP, gait mechanics, compression garments;  EVAL: on Eval findings, POC and HEP  Person educated: Patient Education method: Explanation Education comprehension: verbalized understanding  HOME EXERCISE PROGRAM: Access Code: [email or web address] URL: [email or web address]  08/20/21 - Hip Abduction with Resistance Loop  - 2 x daily - 7 x weekly - 2 sets - 10 reps - Standing Hip Extension Kicks  - 2 x daily - 7 x weekly - 2 sets - 10 reps  08/11/22 - Heel Raises with Counter Support  - 3 x daily - 7 x weekly - 1-2 sets - 10 reps - Toe Raises with Counter Support  - 3 x daily - 7 x weekly - 1-2 sets - 10 reps - Tandem Stance  - 3 x daily - 7 x weekly - 3 reps - 30 second hold - Sit to Stand with Arms Crossed  - 3 x daily - 7 x weekly - 1-2 sets - 10 reps  08/06/22 - Straight Leg Raise  - 2-3 x daily - 7 x weekly - 3 sets - 10 reps - Sidelying Hip Abduction  - 2-3 x daily - 7 x weekly - 3 sets - 10 reps - Seated Calf Stretch with Strap  - 2-3 x daily - 7 x weekly - 1 sets - 3 reps - 30 second hold  Date: 08/04/2022 Prepared by: Josue Hector  Exercises - Seated Ankle Plantar Flexion with Resistance Loop  -  2-3 x daily - 7 x weekly - 3 sets - 10 reps - Seated Figure 4 Ankle Inversion with Resistance  - 2-3 x daily - 7 x weekly - 3 sets - 10 reps - Seated Ankle Eversion with Resistance  - 2-3 x daily - 7 x weekly - 3 sets - 10 reps  ASSESSMENT:  CLINICAL IMPRESSION: Patient showing improved stability today. Doing well with K tape for support. No increased pain reported today. Slight compensation with descending 4 inch stairs. Progressed to sidestepping with green band for LE strengthening. Patient will continue to benefit from skilled therapy services to reduce remaining deficits and improve functional ability.     OBJECTIVE IMPAIRMENTS: Abnormal gait, decreased activity tolerance, decreased balance, decreased mobility, difficulty walking, decreased ROM, decreased strength, increased edema, increased fascial restrictions, impaired flexibility, improper body mechanics, and pain.   ACTIVITY LIMITATIONS: carrying, lifting, bending, standing, squatting, sleeping, stairs, transfers, and locomotion level  PARTICIPATION LIMITATIONS: meal prep, cleaning, laundry, driving, shopping,  community activity, occupation, and yard work  PERSONAL FACTORS:  None  are also affecting patient's functional outcome.   REHAB POTENTIAL: Good  CLINICAL DECISION MAKING: Stable/uncomplicated  EVALUATION COMPLEXITY: Low   GOALS: SHORT TERM GOALS: Target date: 08/25/2022  Patient will be independent with initial HEP and self-management strategies to improve functional outcomes Baseline:  Goal status: INITIAL    LONG TERM GOALS: Target date: 09/15/2022  Patient will be independent with advanced HEP and self-management strategies to improve functional outcomes Baseline:  Goal status: INITIAL  2.  Patient will improve FOTO score to predicted value to indicate improvement in functional outcomes Baseline: 53% Goal status: INITIAL  3.  Patient will have Rt ankle DF to at least 10 degrees to improve gait mechanics for  improved LOF with ADLs and functional mobility. Baseline: 4 Goal status: INITIAL  4. Patient will have equal to or > 4+/5 MMT throughout RT ankle to improve ability to perform functional mobility, stair ambulation and ADLs.  Baseline: See MMT Goal status: INITIAL   PLAN:  PT FREQUENCY: 1-2x/week  PT DURATION: 6 weeks  PLANNED INTERVENTIONS: Therapeutic exercises, Therapeutic activity, Neuromuscular re-education, Balance training, Gait training, Patient/Family education, Joint manipulation, Joint mobilization, Stair training, Aquatic Therapy, Dry Needling, Electrical stimulation, Spinal manipulation, Spinal mobilization, Cryotherapy, Moist heat, scar mobilization, Taping, Traction, Ultrasound, Biofeedback, Ionotophoresis 4mg /ml Dexamethasone, and Manual therapy.   PLAN FOR NEXT SESSION: Increase step height. Continue balance work. Progress ankle strength and stability as tolerated. Gait and balance when ready. Manual scar tissue massage and edema massage PRN  8:59 AM, 08/26/22 Josue Hector PT DPT  Physical Therapist with [facility]  [phone]

## 2022-09-01 ENCOUNTER — Encounter (HOSPITAL_COMMUNITY): Payer: 59

## 2022-09-03 ENCOUNTER — Ambulatory Visit (HOSPITAL_COMMUNITY): Payer: 59 | Admitting: Physical Therapy

## 2022-09-03 DIAGNOSIS — R2689 Other abnormalities of gait and mobility: Secondary | ICD-10-CM | POA: Diagnosis not present

## 2022-09-03 DIAGNOSIS — M25571 Pain in right ankle and joints of right foot: Secondary | ICD-10-CM

## 2022-09-03 NOTE — Therapy (Signed)
OUTPATIENT PHYSICAL THERAPY LOWER EXTREMITY TREATMENT   Patient Name: Julie [name] MRN: [id number] DOB:1975-09-30, 47 y.o., female Today's Date: 09/03/2022  END OF SESSION:  PT End of Session - 09/03/22 1036     Visit Number 9    Number of Visits 12    Date for PT Re-Evaluation 09/15/22    Authorization Type [facility]    Progress Note Due on Visit 10    PT Start Time 1034    PT Stop Time 1112    PT Time Calculation (min) 38 min    Activity Tolerance Patient tolerated treatment well    Behavior During Therapy [facility] for tasks assessed/performed             Past Medical History:  Diagnosis Date   Anemia    Anemia    Anxiety    Breast discharge 07/01/2013   Pt noticed discharge, none on exam today will check TSH and Prolactin   Breast mass, right 10/20/2012   Depression    Dysmenorrhea 09/19/2015   Dyspareunia in female 09/19/2015   Ectopic pregnancy    Eczema    GERD (gastroesophageal reflux disease)    no meds   Headache    Hernia of abdominal wall 01/04/2013   Kidney stone    Left breast mass 04/12/2014   Has pea sized nodule at 6 o'clock tender and mobile, will get mammogram and [place] if needed   Menorrhagia with regular cycle 09/19/2015   Pelvic pain in female 09/19/2015   Placenta previa 2012   Current pregnancy    Urinary tract infection    Vaginal discharge 07/01/2013   Past Surgical History:  Procedure Laterality Date   ABDOMINAL HYSTERECTOMY N/A 10/10/2015   Procedure: HYSTERECTOMY ABDOMINAL;  Surgeon: Florian Buff, MD;  Location: [facility];  Service: Gynecology;  Laterality: N/A;   APPENDECTOMY     APPENDECTOMY     BILATERAL SALPINGECTOMY Bilateral 10/10/2015   Procedure: BILATERAL SALPINGECTOMY;  Surgeon: Florian Buff, MD;  Location: [facility];  Service: Gynecology;  Laterality: Bilateral;   BREAST BIOPSY Right 11/04/2012   hylanized tissue with minimal chronic inflammatory tissue   BREAST BIOPSY Right 07/16/2021   CESAREAN SECTION  04/24/2011    Procedure: CESAREAN SECTION;  Surgeon: Florian Buff, MD;  Location: [facility];  Service: Gynecology;  Laterality: N/A;  Primary/Previa   CHOLECYSTECTOMY     COLONOSCOPY     CYSTOSCOPY W/ URETERAL STENT PLACEMENT Left 08/04/2014   Procedure: CYSTOSCOPY WITH LEFT  RETROGRADE PYELOGRAM/ LEFT URETERAL STENT PLACEMENT;  Surgeon: Festus Aloe, MD;  Location: [facility];  Service: Urology;  Laterality: Left;   TONSILLECTOMY     UPPER GI ENDOSCOPY     Patient Active Problem List   Diagnosis Date Noted   Change in bowel habit 06/25/2021   Colon cancer screening 06/25/2021   Dysphagia 06/25/2021   Gastroesophageal reflux disease 06/25/2021   Rectal bleeding 06/25/2021   Anxiety 01/04/2021   Depressive disorder 01/04/2021   Ovarian cyst 01/10/2019   S/P hysterectomy 10/10/2015   Dyspareunia in female 09/19/2015   Dysmenorrhea 09/19/2015   Left breast mass 04/12/2014   Hernia of abdominal wall 01/04/2013   Breast mass, right 10/20/2012    PCP: Sharilyn Sites MD   REFERRING PROVIDER: Garrel Ridgel, [place]  REFERRING DIAG: [phone] (ICD-10-CM) - Tear of peroneal tendon, right, subsequent encounter Z98.890 (ICD-10-CM) - Status post right foot surgery  THERAPY DIAG:  Pain in right ankle and joints of right foot  Other  abnormalities of gait and mobility  Rationale for Evaluation and Treatment: Rehabilitation  ONSET DATE: 05/17/23  SUBJECTIVE:   SUBJECTIVE STATEMENT: Went on trip to [place] this weekend. Did a lot of walking. Foot swelled up on day 2. She did what she could. Feeling better today. Feels better wearing nike shoes recently.   Eval: Patient presents to therapy with complaint of RT ankle pain. She had foot surgery 05/16/22. She is still having trouble with the way she walks. She is still having a lot of pain when walking. She states that sometimes it will "catch" and the pain is breathtaking. She is not taking pain medication at this time. She wears lace up ankle brace for stability.    PERTINENT HISTORY: RT ankle surgery 05/16/22 PAIN:  Are you having pain? Yes: NPRS scale: 0/10 Pain location: Rt lateral ankle  Pain description: sharp, "breathtaking"  Aggravating factors: walking, WB  Relieving factors: non WB   PRECAUTIONS: None  WEIGHT BEARING RESTRICTIONS: No  FALLS:  Has patient fallen in last 6 months? Yes. Number of falls 1  LIVING ENVIRONMENT: Lives with: lives with their family and lives with their spouse Lives in: House/apartment Stairs: No Has following equipment at home: Crutches  OCCUPATION: CMA   PLOF: Independent  PATIENT GOALS: Be able to use my foot without popping and cracking   NEXT MD VISIT: None scheduled   OBJECTIVE:   DIAGNOSTIC FINDINGS: NA  PATIENT SURVEYS:  FOTO 53% function   COGNITION: Overall cognitive status: Within functional limits for tasks assessed    PALPATION: Min TTP about Mid RT foot arch   LOWER EXTREMITY ROM:  Active ROM Right eval Left eval  Hip flexion    Hip extension    Hip abduction    Hip adduction    Hip internal rotation    Hip external rotation    Knee flexion    Knee extension    Ankle dorsiflexion 4   Ankle plantarflexion 46   Ankle inversion    Ankle eversion     (Blank rows = not tested)  LOWER EXTREMITY MMT:  MMT Right eval Left eval  Hip flexion    Hip extension    Hip abduction    Hip adduction    Hip internal rotation    Hip external rotation    Knee flexion    Knee extension    Ankle dorsiflexion 4   Ankle plantarflexion    Ankle inversion 4   Ankle eversion 4 P!    (Blank rows = not tested)   GAIT: Slightly antalgic, decreased stance on RT, no AD   TODAY'S TREATMENT:                                                                                                                              DATE:  09/03/22 Recumbent bike x 5 min dynamic warm up  Standing: Heel/toe raises on incline x 20 Slant board 3 x 20" 4  inch stairs 3 RT no rails, reciprocal  pattern  6 inch stairs 3 RT no rails, reciprocal pattern  Machine walkout 4 plates x 10  Machine walkouts lateral 2 plates x 10 each  Tandem stance on 1/2 foam 3 x 30"  SLS 3 x 20" int HHA on foam   Leg press 4 plates 2 x 10  [id number] Recumbent bike x 5 min dynamic warm up  Standing: Heel/toe raises on incline x 20 Slant board 3 x 20" 4 inch stairs 5 RT no rails, reciprocal pattern  BAPS level 3 CW and CCW x 1' each GTB band sidestepping 3 RT  Machine walkout 4 plates x 10  Tandem stance on foam 3 x 30"  SLS 3 x 10"    08/25/22 Recumbent bike x 5 min dynamic warm up  Standing: Heel/toe raises on incline x 20 Slant board 5 x 20" 6" box step ups x 20  4" box step downs x 20 BAPS level 3 CW and CCW x 1' each Rocker board F/B and S/S 2 x 1' each GTB hip abduction 2 x 10 GTB hip extension 2 x 10  Kinesiotape right lateral foot/ankle following peroneals to promote improved muscle activation  08/21/22 Rec bike 4 min AROM  Slant board 3 x 20" BAPs (standing) Lv 1 x 30 DF/PF; INV/ EV; CW/ CCW Standing HR  x 20   Standing TR  x 20   4 inch step up x20 4 inch step down x 20 SLS 3 x 15" on foam  Retro walkouts 3 plates x10 Standing hip abduction GTB x 20 Standing hip extension GTB x 20   K tape for Rt ankle evertor facilitation   PATIENT EDUCATION:  Education details: 08/11/22: HEP, gait mechanics, compression garments;  EVAL: on Eval findings, POC and HEP  Person educated: Patient Education method: Explanation Education comprehension: verbalized understanding  HOME EXERCISE PROGRAM: Access Code: [email or web address] URL: [email or web address]  08/20/21 - Hip Abduction with Resistance Loop  - 2 x daily - 7 x weekly - 2 sets - 10 reps - Standing Hip Extension Kicks  - 2 x daily - 7 x weekly - 2 sets - 10 reps  08/11/22 - Heel Raises with Counter Support  - 3 x daily - 7 x weekly - 1-2 sets - 10 reps - Toe Raises with Counter Support  - 3 x daily - 7 x weekly - 1-2  sets - 10 reps - Tandem Stance  - 3 x daily - 7 x weekly - 3 reps - 30 second hold - Sit to Stand with Arms Crossed  - 3 x daily - 7 x weekly - 1-2 sets - 10 reps  08/06/22 - Straight Leg Raise  - 2-3 x daily - 7 x weekly - 3 sets - 10 reps - Sidelying Hip Abduction  - 2-3 x daily - 7 x weekly - 3 sets - 10 reps - Seated Calf Stretch with Strap  - 2-3 x daily - 7 x weekly - 1 sets - 3 reps - 30 second hold  Date: 08/04/2022 Prepared by: Josue Hector  Exercises - Seated Ankle Plantar Flexion with Resistance Loop  - 2-3 x daily - 7 x weekly - 3 sets - 10 reps - Seated Figure 4 Ankle Inversion with Resistance  - 2-3 x daily - 7 x weekly - 3 sets - 10 reps - Seated Ankle Eversion with Resistance  - 2-3 x daily - 7 x weekly - 3 sets -  10 reps  ASSESSMENT:  CLINICAL IMPRESSION: Patient tolerated there ex progression well today. Patient showing improved balance and ankle stability. No increased pain reported during today's session. Added machine sidestepping and leg press for LE strength progressions. Patient requiring minimal cueing today for good return of activities performed. Patient will continue to benefit from skilled therapy services to reduce remaining deficits and improve functional ability.     OBJECTIVE IMPAIRMENTS: Abnormal gait, decreased activity tolerance, decreased balance, decreased mobility, difficulty walking, decreased ROM, decreased strength, increased edema, increased fascial restrictions, impaired flexibility, improper body mechanics, and pain.   ACTIVITY LIMITATIONS: carrying, lifting, bending, standing, squatting, sleeping, stairs, transfers, and locomotion level  PARTICIPATION LIMITATIONS: meal prep, cleaning, laundry, driving, shopping, community activity, occupation, and yard work  PERSONAL FACTORS:  None  are also affecting patient's functional outcome.   REHAB POTENTIAL: Good  CLINICAL DECISION MAKING: Stable/uncomplicated  EVALUATION COMPLEXITY:  Low   GOALS: SHORT TERM GOALS: Target date: 08/25/2022  Patient will be independent with initial HEP and self-management strategies to improve functional outcomes Baseline:  Goal status: INITIAL    LONG TERM GOALS: Target date: 09/15/2022  Patient will be independent with advanced HEP and self-management strategies to improve functional outcomes Baseline:  Goal status: INITIAL  2.  Patient will improve FOTO score to predicted value to indicate improvement in functional outcomes Baseline: 53% Goal status: INITIAL  3.  Patient will have Rt ankle DF to at least 10 degrees to improve gait mechanics for improved LOF with ADLs and functional mobility. Baseline: 4 Goal status: INITIAL  4. Patient will have equal to or > 4+/5 MMT throughout RT ankle to improve ability to perform functional mobility, stair ambulation and ADLs.  Baseline: See MMT Goal status: INITIAL   PLAN:  PT FREQUENCY: 1-2x/week  PT DURATION: 6 weeks  PLANNED INTERVENTIONS: Therapeutic exercises, Therapeutic activity, Neuromuscular re-education, Balance training, Gait training, Patient/Family education, Joint manipulation, Joint mobilization, Stair training, Aquatic Therapy, Dry Needling, Electrical stimulation, Spinal manipulation, Spinal mobilization, Cryotherapy, Moist heat, scar mobilization, Taping, Traction, Ultrasound, Biofeedback, Ionotophoresis 4mg /ml Dexamethasone, and Manual therapy.   PLAN FOR NEXT SESSION:  Progress ankle strength and stability as tolerated. Gait and balance. Manual scar tissue massage and edema massage PRN  11:13 AM, 09/03/22 Josue Hector PT DPT  Physical Therapist with [facility]  [phone]

## 2022-09-08 ENCOUNTER — Encounter (HOSPITAL_COMMUNITY): Payer: 59

## 2022-09-10 ENCOUNTER — Other Ambulatory Visit: Payer: Self-pay

## 2022-09-10 ENCOUNTER — Encounter (HOSPITAL_COMMUNITY): Payer: 59

## 2022-09-11 ENCOUNTER — Encounter (HOSPITAL_COMMUNITY): Payer: 59

## 2022-09-11 ENCOUNTER — Other Ambulatory Visit: Payer: Self-pay

## 2022-09-12 ENCOUNTER — Ambulatory Visit (HOSPITAL_COMMUNITY): Payer: 59 | Attending: Podiatry

## 2022-09-12 DIAGNOSIS — R2689 Other abnormalities of gait and mobility: Secondary | ICD-10-CM | POA: Diagnosis not present

## 2022-09-12 DIAGNOSIS — M25571 Pain in right ankle and joints of right foot: Secondary | ICD-10-CM | POA: Diagnosis not present

## 2022-09-12 NOTE — Therapy (Signed)
OUTPATIENT PHYSICAL THERAPY LOWER EXTREMITY TREATMENT/PROGRESS NOTE Progress Note Reporting Period 08/04/2022 to 09/12/2022  See note below for Objective Data and Assessment of Progress/Goals.       Patient Name: Julie [name] MRN: [id number] DOB:[birth year], 47 y.o., female Today's Date: 09/12/2022  END OF SESSION:  PT End of Session - 09/12/22 0733     Visit Number 10    Number of Visits 12    Date for PT Re-Evaluation 09/15/22    Authorization Type [facility]    Progress Note Due on Visit 10    PT Start Time 0732    PT Stop Time 0812    PT Time Calculation (min) 40 min    Activity Tolerance Patient tolerated treatment well    Behavior During Therapy [facility] for tasks assessed/performed             Past Medical History:  Diagnosis Date   Anemia    Anemia    Anxiety    Breast discharge 07/01/2013   Pt noticed discharge, none on exam today will check TSH and Prolactin   Breast mass, right 10/20/2012   Depression    Dysmenorrhea 09/19/2015   Dyspareunia in female 09/19/2015   Ectopic pregnancy    Eczema    GERD (gastroesophageal reflux disease)    no meds   Headache    Hernia of abdominal wall 01/04/2013   Kidney stone    Left breast mass 04/12/2014   Has pea sized nodule at 6 o'clock tender and mobile, will get mammogram and [place] if needed   Menorrhagia with regular cycle 09/19/2015   Pelvic pain in female 09/19/2015   Placenta previa 2012   Current pregnancy    Urinary tract infection    Vaginal discharge 07/01/2013   Past Surgical History:  Procedure Laterality Date   ABDOMINAL HYSTERECTOMY N/A 10/10/2015   Procedure: HYSTERECTOMY ABDOMINAL;  Surgeon: Lazaro Arms, MD;  Location: [facility];  Service: Gynecology;  Laterality: N/A;   APPENDECTOMY     APPENDECTOMY     BILATERAL SALPINGECTOMY Bilateral 10/10/2015   Procedure: BILATERAL SALPINGECTOMY;  Surgeon: Lazaro Arms, MD;  Location: [facility];  Service: Gynecology;  Laterality: Bilateral;   BREAST BIOPSY Right  11/04/2012   hylanized tissue with minimal chronic inflammatory tissue   BREAST BIOPSY Right 07/16/2021   CESAREAN SECTION  04/24/2011   Procedure: CESAREAN SECTION;  Surgeon: Lazaro Arms, MD;  Location: [facility];  Service: Gynecology;  Laterality: N/A;  Primary/Previa   CHOLECYSTECTOMY     COLONOSCOPY     CYSTOSCOPY W/ URETERAL STENT PLACEMENT Left 08/04/2014   Procedure: CYSTOSCOPY WITH LEFT  RETROGRADE PYELOGRAM/ LEFT URETERAL STENT PLACEMENT;  Surgeon: Jerilee Field, MD;  Location: [facility];  Service: Urology;  Laterality: Left;   TONSILLECTOMY     UPPER GI ENDOSCOPY     Patient Active Problem List   Diagnosis Date Noted   Change in bowel habit 06/25/2021   Colon cancer screening 06/25/2021   Dysphagia 06/25/2021   Gastroesophageal reflux disease 06/25/2021   Rectal bleeding 06/25/2021   Anxiety 01/04/2021   Depressive disorder 01/04/2021   Ovarian cyst 01/10/2019   S/P hysterectomy 10/10/2015   Dyspareunia in female 09/19/2015   Dysmenorrhea 09/19/2015   Left breast mass 04/12/2014   Hernia of abdominal wall 01/04/2013   Breast mass, right 10/20/2012    PCP: Assunta Found MD   REFERRING PROVIDER: Elinor Parkinson, DPM  REFERRING DIAG: [phone] (ICD-10-CM) - Tear of peroneal tendon, right, subsequent encounter  Z98.890 (ICD-10-CM) - Status post right foot surgery  THERAPY DIAG:  Pain in right ankle and joints of right foot  Other abnormalities of gait and mobility  Rationale for Evaluation and Treatment: Rehabilitation  ONSET DATE: 05/17/23  SUBJECTIVE:   SUBJECTIVE STATEMENT: Tired today but otherwise doing ok. Still has some swelling right lateral ankle and mild redness but able to do more overall.  "80% better"   Eval: Patient presents to therapy with complaint of RT ankle pain. She had foot surgery 05/16/22. She is still having trouble with the way she walks. She is still having a lot of pain when walking. She states that sometimes it will "catch" and the pain is  breathtaking. She is not taking pain medication at this time. She wears lace up ankle brace for stability.   PERTINENT HISTORY: RT ankle surgery 05/16/22 PAIN:  Are you having pain? Yes: NPRS scale: 0/10 Pain location: Rt lateral ankle  Pain description: sharp, "breathtaking"  Aggravating factors: walking, WB  Relieving factors: non WB   PRECAUTIONS: None  WEIGHT BEARING RESTRICTIONS: No  FALLS:  Has patient fallen in last 6 months? Yes. Number of falls 1  LIVING ENVIRONMENT: Lives with: lives with their family and lives with their spouse Lives in: House/apartment Stairs: No Has following equipment at home: Crutches  OCCUPATION: CMA   PLOF: Independent  PATIENT GOALS: Be able to use my foot without popping and cracking   NEXT MD VISIT: None scheduled   OBJECTIVE:   DIAGNOSTIC FINDINGS: NA  PATIENT SURVEYS:  FOTO 53% function   COGNITION: Overall cognitive status: Within functional limits for tasks assessed    PALPATION: Min TTP about Mid RT foot arch   LOWER EXTREMITY ROM:  Active ROM Right eval Left eval Right 09/12/22  Hip flexion     Hip extension     Hip abduction     Hip adduction     Hip internal rotation     Hip external rotation     Knee flexion     Knee extension     Ankle dorsiflexion 4  8  Ankle plantarflexion 46  48  Ankle inversion     Ankle eversion      (Blank rows = not tested)  LOWER EXTREMITY MMT:  MMT Right eval Left eval Right 09/12/22  Hip flexion     Hip extension     Hip abduction     Hip adduction     Hip internal rotation     Hip external rotation     Knee flexion     Knee extension     Ankle dorsiflexion 4  5  Ankle plantarflexion     Ankle inversion 4  5  Ankle eversion 4 P!  4+ P!   (Blank rows = not tested)   GAIT: Slightly antalgic, decreased stance on RT, no AD   TODAY'S TREATMENT:  DATE:  09/12/22 Recumbent bike x 5 min dynamic warm up Standing: Heel/toe raises x 20 Slant board 5 x 20" Foam beam 2 x 30" each SLS right foot only 3 x 20"  Progress note FOTO 62% AROM and MMT's see above    09/03/22 Recumbent bike x 5 min dynamic warm up  Standing: Heel/toe raises on incline x 20 Slant board 3 x 20" 4 inch stairs 3 RT no rails, reciprocal pattern  6 inch stairs 3 RT no rails, reciprocal pattern  Machine walkout 4 plates x 10  Machine walkouts lateral 2 plates x 10 each  Tandem stance on 1/2 foam 3 x 30"  SLS 3 x 20" int HHA on foam   Leg press 4 plates 2 x 10  1/32/443/19/24 Recumbent bike x 5 min dynamic warm up  Standing: Heel/toe raises on incline x 20 Slant board 3 x 20" 4 inch stairs 5 RT no rails, reciprocal pattern  BAPS level 3 CW and CCW x 1' each GTB band sidestepping 3 RT  Machine walkout 4 plates x 10  Tandem stance on foam 3 x 30"  SLS 3 x 10"    08/25/22 Recumbent bike x 5 min dynamic warm up  Standing: Heel/toe raises on incline x 20 Slant board 5 x 20" 6" box step ups x 20  4" box step downs x 20 BAPS level 3 CW and CCW x 1' each Rocker board F/B and S/S 2 x 1' each GTB hip abduction 2 x 10 GTB hip extension 2 x 10  Kinesiotape right lateral foot/ankle following peroneals to promote improved muscle activation  08/21/22 Rec bike 4 min AROM  Slant board 3 x 20" BAPs (standing) Lv 1 x 30 DF/PF; INV/ EV; CW/ CCW Standing HR  x 20   Standing TR  x 20   4 inch step up x20 4 inch step down x 20 SLS 3 x 15" on foam  Retro walkouts 3 plates W10x10 Standing hip abduction GTB x 20 Standing hip extension GTB x 20   K tape for Rt ankle evertor facilitation   PATIENT EDUCATION:  Education details: 08/11/22: HEP, gait mechanics, compression garments;  EVAL: on Eval findings, POC and HEP  Person educated: Patient Education method: Explanation Education comprehension: verbalized understanding  HOME EXERCISE PROGRAM: Access Code:  [email or web address] URL: [email or web address]  08/20/21 - Hip Abduction with Resistance Loop  - 2 x daily - 7 x weekly - 2 sets - 10 reps - Standing Hip Extension Kicks  - 2 x daily - 7 x weekly - 2 sets - 10 reps  08/11/22 - Heel Raises with Counter Support  - 3 x daily - 7 x weekly - 1-2 sets - 10 reps - Toe Raises with Counter Support  - 3 x daily - 7 x weekly - 1-2 sets - 10 reps - Tandem Stance  - 3 x daily - 7 x weekly - 3 reps - 30 second hold - Sit to Stand with Arms Crossed  - 3 x daily - 7 x weekly - 1-2 sets - 10 reps  08/06/22 - Straight Leg Raise  - 2-3 x daily - 7 x weekly - 3 sets - 10 reps - Sidelying Hip Abduction  - 2-3 x daily - 7 x weekly - 3 sets - 10 reps - Seated Calf Stretch with Strap  - 2-3 x daily - 7 x weekly - 1 sets - 3 reps - 30 second hold  Date: 08/04/2022 Prepared by:  Georges Audreyanna Butkiewicz  Exercises - Seated Ankle Plantar Flexion with Resistance Loop  - 2-3 x daily - 7 x weekly - 3 sets - 10 reps - Seated Figure 4 Ankle Inversion with Resistance  - 2-3 x daily - 7 x weekly - 3 sets - 10 reps - Seated Ankle Eversion with Resistance  - 2-3 x daily - 7 x weekly - 3 sets - 10 reps  ASSESSMENT:  CLINICAL IMPRESSION: Progress note.  Mild improvement with mobility; good improvement with strength, FOTO score improved. Patient has met 1/1 STG and 1/4 LTG.  Patient will continue to benefit from skilled therapy services to reduce remaining deficits and improve functional ability; met partially met and unmet goals.      OBJECTIVE IMPAIRMENTS: Abnormal gait, decreased activity tolerance, decreased balance, decreased mobility, difficulty walking, decreased ROM, decreased strength, increased edema, increased fascial restrictions, impaired flexibility, improper body mechanics, and pain.   ACTIVITY LIMITATIONS: carrying, lifting, bending, standing, squatting, sleeping, stairs, transfers, and locomotion level  PARTICIPATION LIMITATIONS: meal prep, cleaning, laundry,  driving, shopping, community activity, occupation, and yard work  PERSONAL FACTORS:  None  are also affecting patient's functional outcome.   REHAB POTENTIAL: Good  CLINICAL DECISION MAKING: Stable/uncomplicated  EVALUATION COMPLEXITY: Low   GOALS: SHORT TERM GOALS: Target date: 08/25/2022  Patient will be independent with initial HEP and self-management strategies to improve functional outcomes Baseline:  Goal status: MET    LONG TERM GOALS: Target date: 09/15/2022  Patient will be independent with advanced HEP and self-management strategies to improve functional outcomes Baseline:  Goal status: IN PROGRESS  2.  Patient will improve FOTO score to predicted value to indicate improvement in functional outcomes (69) Baseline: 53%; 09/12/22 62 Goal status: IN PROGRESS  3.  Patient will have Rt ankle DF to at least 10 degrees to improve gait mechanics for improved LOF with ADLs and functional mobility. Baseline: 4; 8 09/12/22 Goal status: IN PROGRESS  4. Patient will have equal to or > 4+/5 MMT throughout RT ankle to improve ability to perform functional mobility, stair ambulation and ADLs.  Baseline: See MMT Goal status: MET   PLAN:  PT FREQUENCY: 1-2x/week  PT DURATION: 6 weeks  PLANNED INTERVENTIONS: Therapeutic exercises, Therapeutic activity, Neuromuscular re-education, Balance training, Gait training, Patient/Family education, Joint manipulation, Joint mobilization, Stair training, Aquatic Therapy, Dry Needling, Electrical stimulation, Spinal manipulation, Spinal mobilization, Cryotherapy, Moist heat, scar mobilization, Taping, Traction, Ultrasound, Biofeedback, Ionotophoresis 4mg /ml Dexamethasone, and Manual therapy.   PLAN FOR NEXT SESSION:  Progress ankle strength and stability as tolerated. Gait and balance. Manual scar tissue massage and edema massage PRN;  sees MD, Dr. Al Corpus, on 09/15/22 at 1:15pm  8:14 AM, 09/12/22 Dermot Gremillion Small Yazan Gatling MPT [facility] [place] [phone] [phone]

## 2022-09-15 ENCOUNTER — Ambulatory Visit (HOSPITAL_COMMUNITY): Payer: 59 | Admitting: Physical Therapy

## 2022-09-15 ENCOUNTER — Ambulatory Visit (INDEPENDENT_AMBULATORY_CARE_PROVIDER_SITE_OTHER): Payer: 59 | Admitting: Podiatry

## 2022-09-15 DIAGNOSIS — Z9889 Other specified postprocedural states: Secondary | ICD-10-CM

## 2022-09-15 DIAGNOSIS — M25571 Pain in right ankle and joints of right foot: Secondary | ICD-10-CM | POA: Diagnosis not present

## 2022-09-15 DIAGNOSIS — S86311D Strain of muscle(s) and tendon(s) of peroneal muscle group at lower leg level, right leg, subsequent encounter: Secondary | ICD-10-CM | POA: Diagnosis not present

## 2022-09-15 DIAGNOSIS — R2689 Other abnormalities of gait and mobility: Secondary | ICD-10-CM | POA: Diagnosis not present

## 2022-09-15 NOTE — Progress Notes (Addendum)
She presents today for follow-up of her surgery.  She states that she is doing quite well and physical therapy is very impressed with her progress.  She still has considerable swelling to the right foot.  Objective: Vitals are stable she is alert and oriented x 3 physical swelling to lateral aspect of the right ankle.  Some tenderness on palpation of the incision site.  She has good full range of motion and 5/5 mobic muscle strength on eversion and plantarflexion against resistance as well as plantarflexion and eversion.  Assessment well-healing surgical foot and ankle.  Plan will get her set up for orthotic casting Will allow her to get back to work April 22.

## 2022-09-15 NOTE — Therapy (Signed)
OUTPATIENT PHYSICAL THERAPY LOWER EXTREMITY TREATMENT/PROGRESS NOTE   Patient Name: Julie [name] MRN: [id number] DOB:10-27-1975, 47 y.o., female Today's Date: 09/15/2022  END OF SESSION:  PT End of Session - 09/15/22 0859     Visit Number 11    Number of Visits 12    Date for PT Re-Evaluation 09/15/22    Authorization Type [facility] [employer]    Progress Note Due on Visit 10    PT Start Time 0902    PT Stop Time 0940    PT Time Calculation (min) 38 min    Activity Tolerance Patient tolerated treatment well    Behavior During Therapy [facility] for tasks assessed/performed             Past Medical History:  Diagnosis Date   Anemia    Anemia    Anxiety    Breast discharge 07/01/2013   Pt noticed discharge, none on exam today will check TSH and Prolactin   Breast mass, right 10/20/2012   Depression    Dysmenorrhea 09/19/2015   Dyspareunia in female 09/19/2015   Ectopic pregnancy    Eczema    GERD (gastroesophageal reflux disease)    no meds   Headache    Hernia of abdominal wall 01/04/2013   Kidney stone    Left breast mass 04/12/2014   Has pea sized nodule at 6 o'clock tender and mobile, will get mammogram and [place] if needed   Menorrhagia with regular cycle 09/19/2015   Pelvic pain in female 09/19/2015   Placenta previa 2012   Current pregnancy    Urinary tract infection    Vaginal discharge 07/01/2013   Past Surgical History:  Procedure Laterality Date   ABDOMINAL HYSTERECTOMY N/A 10/10/2015   Procedure: HYSTERECTOMY ABDOMINAL;  Surgeon: Lazaro Arms, MD;  Location: [facility];  Service: Gynecology;  Laterality: N/A;   APPENDECTOMY     APPENDECTOMY     BILATERAL SALPINGECTOMY Bilateral 10/10/2015   Procedure: BILATERAL SALPINGECTOMY;  Surgeon: Lazaro Arms, MD;  Location: [facility];  Service: Gynecology;  Laterality: Bilateral;   BREAST BIOPSY Right 11/04/2012   hylanized tissue with minimal chronic inflammatory tissue   BREAST BIOPSY Right 07/16/2021   CESAREAN SECTION   04/24/2011   Procedure: CESAREAN SECTION;  Surgeon: Lazaro Arms, MD;  Location: [facility];  Service: Gynecology;  Laterality: N/A;  Primary/Previa   CHOLECYSTECTOMY     COLONOSCOPY     CYSTOSCOPY W/ URETERAL STENT PLACEMENT Left 08/04/2014   Procedure: CYSTOSCOPY WITH LEFT  RETROGRADE PYELOGRAM/ LEFT URETERAL STENT PLACEMENT;  Surgeon: Jerilee Field, MD;  Location: [facility];  Service: Urology;  Laterality: Left;   TONSILLECTOMY     UPPER GI ENDOSCOPY     Patient Active Problem List   Diagnosis Date Noted   Change in bowel habit 06/25/2021   Colon cancer screening 06/25/2021   Dysphagia 06/25/2021   Gastroesophageal reflux disease 06/25/2021   Rectal bleeding 06/25/2021   Anxiety 01/04/2021   Depressive disorder 01/04/2021   Ovarian cyst 01/10/2019   S/P hysterectomy 10/10/2015   Dyspareunia in female 09/19/2015   Dysmenorrhea 09/19/2015   Left breast mass 04/12/2014   Hernia of abdominal wall 01/04/2013   Breast mass, right 10/20/2012    PCP: Assunta Found MD   REFERRING PROVIDER: Elinor Parkinson, [place]  REFERRING DIAG: [phone] (ICD-10-CM) - Tear of peroneal tendon, right, subsequent encounter Z98.890 (ICD-10-CM) - Status post right foot surgery  THERAPY DIAG:  Pain in right ankle and joints of right foot  Other abnormalities of gait and mobility  Rationale for Evaluation and Treatment: Rehabilitation  ONSET DATE: 05/17/23  SUBJECTIVE:   SUBJECTIVE STATEMENT: Patient states she feels about 90% better. She is doing to see foot Doctor today and will see what they say about continuing therapy.    Eval: Patient presents to therapy with complaint of RT ankle pain. She had foot surgery 05/16/22. She is still having trouble with the way she walks. She is still having a lot of pain when walking. She states that sometimes it will "catch" and the pain is breathtaking. She is not taking pain medication at this time. She wears lace up ankle brace for stability.   PERTINENT HISTORY: RT  ankle surgery 05/16/22 PAIN:  Are you having pain? Yes: NPRS scale: 0/10 Pain location: Rt lateral ankle  Pain description: sharp, "breathtaking"  Aggravating factors: walking, WB  Relieving factors: non WB   PRECAUTIONS: None  WEIGHT BEARING RESTRICTIONS: No  FALLS:  Has patient fallen in last 6 months? Yes. Number of falls 1  LIVING ENVIRONMENT: Lives with: lives with their family and lives with their spouse Lives in: House/apartment Stairs: No Has following equipment at home: Crutches  OCCUPATION: CMA   PLOF: Independent  PATIENT GOALS: Be able to use my foot without popping and cracking   NEXT MD VISIT: None scheduled   OBJECTIVE:   DIAGNOSTIC FINDINGS: NA  PATIENT SURVEYS:  FOTO 53% function   COGNITION: Overall cognitive status: Within functional limits for tasks assessed    PALPATION: Min TTP about Mid RT foot arch   LOWER EXTREMITY ROM:  Active ROM Right eval Left eval Right 09/12/22  Hip flexion     Hip extension     Hip abduction     Hip adduction     Hip internal rotation     Hip external rotation     Knee flexion     Knee extension     Ankle dorsiflexion 4  8  Ankle plantarflexion 46  48  Ankle inversion     Ankle eversion      (Blank rows = not tested)  LOWER EXTREMITY MMT:  MMT Right eval Left eval Right 09/12/22  Hip flexion     Hip extension     Hip abduction     Hip adduction     Hip internal rotation     Hip external rotation     Knee flexion     Knee extension     Ankle dorsiflexion 4  5  Ankle plantarflexion     Ankle inversion 4  5  Ankle eversion 4 P!  4+ P!   (Blank rows = not tested)   GAIT: Slightly antalgic, decreased stance on RT, no AD   TODAY'S TREATMENT:                                                                                                                              DATE:  09/15/22 Rec bike 4 min lv 3  Calf stretch on steps 2 x 30"  Stairs 4 inch 4 RT  Machine walkouts 4 plates K25  Lateral  machine walkouts 3 plates x 10 each   Tandem stance on foam 2 x 30" each  SLS vectors on foam 3 x 10" each  Leg press 4 plates 3 x 10   09/12/22 Recumbent bike x 5 min dynamic warm up Standing: Heel/toe raises x 20 Slant board 5 x 20" Foam beam 2 x 30" each SLS right foot only 3 x 20"  Progress note FOTO 62% AROM and MMT's see above  09/03/22 Recumbent bike x 5 min dynamic warm up  Standing: Heel/toe raises on incline x 20 Slant board 3 x 20" 4 inch stairs 3 RT no rails, reciprocal pattern  6 inch stairs 3 RT no rails, reciprocal pattern  Machine walkout 4 plates x 10  Machine walkouts lateral 2 plates x 10 each  Tandem stance on 1/2 foam 3 x 30"  SLS 3 x 20" int HHA on foam   Leg press 4 plates 2 x 10  7/50/51 Recumbent bike x 5 min dynamic warm up  Standing: Heel/toe raises on incline x 20 Slant board 3 x 20" 4 inch stairs 5 RT no rails, reciprocal pattern  BAPS level 3 CW and CCW x 1' each GTB band sidestepping 3 RT  Machine walkout 4 plates x 10  Tandem stance on foam 3 x 30"  SLS 3 x 10"   PATIENT EDUCATION:  Education details: 08/11/22: HEP, gait mechanics, compression garments;  EVAL: on Eval findings, POC and HEP  Person educated: Patient Education method: Explanation Education comprehension: verbalized understanding  HOME EXERCISE PROGRAM: Access Code: [email or web address] URL: [email or web address]  08/20/21 - Hip Abduction with Resistance Loop  - 2 x daily - 7 x weekly - 2 sets - 10 reps - Standing Hip Extension Kicks  - 2 x daily - 7 x weekly - 2 sets - 10 reps  08/11/22 - Heel Raises with Counter Support  - 3 x daily - 7 x weekly - 1-2 sets - 10 reps - Toe Raises with Counter Support  - 3 x daily - 7 x weekly - 1-2 sets - 10 reps - Tandem Stance  - 3 x daily - 7 x weekly - 3 reps - 30 second hold - Sit to Stand with Arms Crossed  - 3 x daily - 7 x weekly - 1-2 sets - 10 reps  08/06/22 - Straight Leg Raise  - 2-3 x daily - 7 x weekly - 3 sets -  10 reps - Sidelying Hip Abduction  - 2-3 x daily - 7 x weekly - 3 sets - 10 reps - Seated Calf Stretch with Strap  - 2-3 x daily - 7 x weekly - 1 sets - 3 reps - 30 second hold  Date: 08/04/2022 Prepared by: Georges Lynch  Exercises - Seated Ankle Plantar Flexion with Resistance Loop  - 2-3 x daily - 7 x weekly - 3 sets - 10 reps - Seated Figure 4 Ankle Inversion with Resistance  - 2-3 x daily - 7 x weekly - 3 sets - 10 reps - Seated Ankle Eversion with Resistance  - 2-3 x daily - 7 x weekly - 3 sets - 10 reps  ASSESSMENT:  CLINICAL IMPRESSION: Patient tolerated session well. Showing improved ROM as well as static balance. Patient able to ambulate steps comfortably without HHA. Sees MD today, will  follow up about potential DC following.    OBJECTIVE IMPAIRMENTS: Abnormal gait, decreased activity tolerance, decreased balance, decreased mobility, difficulty walking, decreased ROM, decreased strength, increased edema, increased fascial restrictions, impaired flexibility, improper body mechanics, and pain.   ACTIVITY LIMITATIONS: carrying, lifting, bending, standing, squatting, sleeping, stairs, transfers, and locomotion level  PARTICIPATION LIMITATIONS: meal prep, cleaning, laundry, driving, shopping, community activity, occupation, and yard work  PERSONAL FACTORS:  None  are also affecting patient's functional outcome.   REHAB POTENTIAL: Good  CLINICAL DECISION MAKING: Stable/uncomplicated  EVALUATION COMPLEXITY: Low   GOALS: SHORT TERM GOALS: Target date: 08/25/2022  Patient will be independent with initial HEP and self-management strategies to improve functional outcomes Baseline:  Goal status: MET    LONG TERM GOALS: Target date: 09/15/2022  Patient will be independent with advanced HEP and self-management strategies to improve functional outcomes Baseline:  Goal status: IN PROGRESS  2.  Patient will improve FOTO score to predicted value to indicate improvement in  functional outcomes (69) Baseline: 53%; 09/12/22 62 Goal status: IN PROGRESS  3.  Patient will have Rt ankle DF to at least 10 degrees to improve gait mechanics for improved LOF with ADLs and functional mobility. Baseline: 4; 8 09/12/22 Goal status: IN PROGRESS  4. Patient will have equal to or > 4+/5 MMT throughout RT ankle to improve ability to perform functional mobility, stair ambulation and ADLs.  Baseline: See MMT Goal status: MET   PLAN:  PT FREQUENCY: 1-2x/week  PT DURATION: 6 weeks  PLANNED INTERVENTIONS: Therapeutic exercises, Therapeutic activity, Neuromuscular re-education, Balance training, Gait training, Patient/Family education, Joint manipulation, Joint mobilization, Stair training, Aquatic Therapy, Dry Needling, Electrical stimulation, Spinal manipulation, Spinal mobilization, Cryotherapy, Moist heat, scar mobilization, Taping, Traction, Ultrasound, Biofeedback, Ionotophoresis 4mg /ml Dexamethasone, and Manual therapy.   PLAN FOR NEXT SESSION:  Progress ankle strength and stability as tolerated. Gait and balance. Manual scar tissue massage and edema massage PRN;  sees MD, Dr. Al Corpus, on 09/15/22 at 1:15pm  9:39 AM, 09/15/22 Georges Lynch PT DPT  Physical Therapist with [facility]  [phone]

## 2022-09-16 ENCOUNTER — Ambulatory Visit (HOSPITAL_COMMUNITY)
Admission: RE | Admit: 2022-09-16 | Discharge: 2022-09-16 | Disposition: A | Discharge status: Home / Self Care | Payer: 59 | Source: Ambulatory Visit | Attending: Internal Medicine | Admitting: Internal Medicine

## 2022-09-16 ENCOUNTER — Encounter (HOSPITAL_COMMUNITY): Payer: Self-pay | Admitting: Physical Therapy

## 2022-09-16 ENCOUNTER — Other Ambulatory Visit (HOSPITAL_COMMUNITY): Payer: Self-pay | Admitting: Internal Medicine

## 2022-09-16 ENCOUNTER — Ambulatory Visit (INDEPENDENT_AMBULATORY_CARE_PROVIDER_SITE_OTHER): Payer: 59 | Admitting: Podiatry

## 2022-09-16 DIAGNOSIS — M79605 Pain in left leg: Secondary | ICD-10-CM | POA: Diagnosis not present

## 2022-09-16 DIAGNOSIS — M79662 Pain in left lower leg: Secondary | ICD-10-CM | POA: Diagnosis not present

## 2022-09-16 DIAGNOSIS — S86311D Strain of muscle(s) and tendon(s) of peroneal muscle group at lower leg level, right leg, subsequent encounter: Secondary | ICD-10-CM

## 2022-09-16 NOTE — Addendum Note (Signed)
Addended by: Lottie Rater E on: 09/16/2022 11:51 AM   Modules accepted: Orders

## 2022-09-16 NOTE — Progress Notes (Signed)
Patient presents today to be casted for custom molded orthotics. HYATT is the treating physician.  Impression foam cast was taken. ABN signed.  Patient info-  Shoe size: 9.5-10  Shoe style: ATHLETIC  Height: 5FT 8IN  Weight: 308 LBS  Insurance: [employer]   Patient will be notified once orthotics arrive in office and reappoint for fitting at that time.

## 2022-09-17 ENCOUNTER — Encounter (HOSPITAL_COMMUNITY): Payer: 59 | Admitting: Physical Therapy

## 2022-10-02 ENCOUNTER — Other Ambulatory Visit: Payer: Self-pay

## 2022-10-08 ENCOUNTER — Encounter: Payer: Self-pay | Admitting: Podiatry

## 2022-10-10 ENCOUNTER — Encounter: Payer: Self-pay | Admitting: Podiatry

## 2022-10-10 NOTE — Telephone Encounter (Signed)
Dawn, can you schedule her with Dr. Al Corpus? Thanks!

## 2022-10-13 ENCOUNTER — Ambulatory Visit (INDEPENDENT_AMBULATORY_CARE_PROVIDER_SITE_OTHER): Payer: 59 | Admitting: Podiatry

## 2022-10-13 ENCOUNTER — Ambulatory Visit (INDEPENDENT_AMBULATORY_CARE_PROVIDER_SITE_OTHER): Payer: 59

## 2022-10-13 ENCOUNTER — Encounter: Payer: Self-pay | Admitting: Podiatry

## 2022-10-13 DIAGNOSIS — S86311D Strain of muscle(s) and tendon(s) of peroneal muscle group at lower leg level, right leg, subsequent encounter: Secondary | ICD-10-CM | POA: Diagnosis not present

## 2022-10-13 DIAGNOSIS — Z9889 Other specified postprocedural states: Secondary | ICD-10-CM

## 2022-10-13 DIAGNOSIS — M778 Other enthesopathies, not elsewhere classified: Secondary | ICD-10-CM

## 2022-10-13 MED ORDER — CELECOXIB 200 MG PO CAPS: 200.0000 mg | ORAL_CAPSULE | Freq: Two times a day (BID) | ORAL | 3 refills | Status: DC

## 2022-10-13 MED ORDER — TRIAMCINOLONE ACETONIDE 40 MG/ML IJ SUSP
20.0000 mg | Freq: Once | INTRAMUSCULAR | Status: AC
  Administered 2022-10-13: 20 mg

## 2022-10-13 MED ORDER — METHYLPREDNISOLONE 4 MG PO TBPK: ORAL_TABLET | ORAL | 0 refills | Status: DC

## 2022-10-13 NOTE — Progress Notes (Signed)
He presents today for follow-up status post peroneal tendon repair on the right foot states that it hurts the bed the other day when she bent down to pick up something she felt a sharp pop in that right ankle she says but since then it has gone down considerably.  She states the majority of her pain is now located over the left foot on the lateral aspect.  She denies any trauma to that area but is exquisitely tender with ambulation.  Objective: Vitals are stable alert oriented x 3 there is no erythematous moderate edema and some warmth on palpation overlying the fifth metatarsal cuboid articulation.  There also appears to be some dorsal dislocation on palpation to this area.  The right foot does not demonstrate any type of osseous abnormalities nor does it demonstrate any tenderness abnormalities it is still swollen laterally but has been since surgery.  She is able to abduct against resistance with no pain.She also has pain on palpation around the fifth metatarsal phalangeal joint and the fourth intermetatarsal space.  Assessment: Capsulitis lateral aspect of the left foot around the fourth fifth tarsometatarsal joint in the fifth metatarsal phalangeal joint and fourth intermetatarsal space.   Plan: Discussed etiology pathology conservative versus surgical therapies.  I injected around the fourth intermetatarsal space left foot today.  Started her on methylprednisolone to be followed by Celebrex and will be scanned orthotics.

## 2022-10-20 ENCOUNTER — Other Ambulatory Visit: Payer: Self-pay

## 2022-10-20 MED ORDER — ALPRAZOLAM 1 MG PO TABS
1.0000 mg | ORAL_TABLET | Freq: Three times a day (TID) | ORAL | 2 refills | Status: DC
  Filled 2022-10-20: qty 90, 30d supply, fill #0
  Filled 2022-11-20: qty 90, 30d supply, fill #1
  Filled 2023-01-04: qty 90, 30d supply, fill #2

## 2022-10-29 DIAGNOSIS — R0789 Other chest pain: Secondary | ICD-10-CM | POA: Diagnosis not present

## 2022-10-30 ENCOUNTER — Telehealth (HOSPITAL_BASED_OUTPATIENT_CLINIC_OR_DEPARTMENT_OTHER): Payer: Self-pay | Admitting: Cardiology

## 2022-10-30 NOTE — Telephone Encounter (Signed)
Patient would like to switch from Dr. Cristal Deer to Dr. Kirke Corin at the [facility] office.  Are you both in agreement with this?

## 2022-10-30 NOTE — Telephone Encounter (Signed)
Fine with me but I have limited availability for new patients.

## 2022-10-31 ENCOUNTER — Telehealth: Payer: Self-pay | Admitting: Cardiovascular Disease

## 2022-10-31 NOTE — Telephone Encounter (Signed)
Pt c/o of Chest Pain: STAT if CP now or developed within 24 hours  1. Are you having CP right now? Chest pain a little at this time- they come and goes  2. Are you experiencing any other symptoms (ex. SOB, nausea, vomiting, sweating)? Shortness of breath on exertion  3. How long have you been experiencing CP? Been going on for about 2 months, but are getting worse  4. Is your CP continuous or coming and going? Comes and goes  5. Have you taken Nitroglycerin? no ?

## 2022-10-31 NOTE — Telephone Encounter (Signed)
Spoke with patient and she stated that she has been having some intermittent chest pain accompanied with shortness of breath with exertion. Informed patient that she needs to be seen immediately but there were no available appointments and instructed her to go to the Ed. Patient stated she saw her PCP earlier in the week and was informed that she needed to have some cardiology testing done. Informed patient that she could have testing completed in ED if needed. Patient understood with read back.

## 2022-11-04 ENCOUNTER — Emergency Department: Payer: 59

## 2022-11-04 ENCOUNTER — Emergency Department
Admission: EM | Admit: 2022-11-04 | Discharge: 2022-11-05 | Disposition: A | Discharge status: Home / Self Care | Payer: 59 | Attending: Emergency Medicine | Admitting: Emergency Medicine

## 2022-11-04 ENCOUNTER — Other Ambulatory Visit: Payer: Self-pay

## 2022-11-04 DIAGNOSIS — R0789 Other chest pain: Secondary | ICD-10-CM | POA: Diagnosis not present

## 2022-11-04 DIAGNOSIS — R202 Paresthesia of skin: Secondary | ICD-10-CM | POA: Insufficient documentation

## 2022-11-04 DIAGNOSIS — R079 Chest pain, unspecified: Secondary | ICD-10-CM | POA: Diagnosis not present

## 2022-11-04 LAB — BASIC METABOLIC PANEL
Anion gap: 9 (ref 5–15)
BUN: 16 mg/dL (ref 6–20)
CO2: 25 mmol/L (ref 22–32)
Calcium: 8.6 mg/dL — ABNORMAL LOW (ref 8.9–10.3)
Chloride: 105 mmol/L (ref 98–111)
Creatinine, Ser: 0.87 mg/dL (ref 0.44–1.00)
GFR, Estimated: >60 mL/min (ref >60)
Glucose, Bld: 88 mg/dL (ref 70–99)
Potassium: 3.6 mmol/L (ref 3.5–5.1)
Sodium: 139 mmol/L (ref 135–145)

## 2022-11-04 LAB — CBC
HCT: 37.5 % (ref 36.0–46.0)
Hemoglobin: 12.2 g/dL (ref 12.0–15.0)
MCH: 26.1 pg (ref 26.0–34.0)
MCHC: 32.5 g/dL (ref 30.0–36.0)
MCV: 80.3 fL (ref 80.0–100.0)
Platelets: 197 10*3/uL (ref 150–400)
RBC: 4.67 MIL/uL (ref 3.87–5.11)
RDW: 13.3 % (ref 11.5–15.5)
WBC: 6.2 10*3/uL (ref 4.0–10.5)
nRBC: 0 % (ref 0.0–0.2)

## 2022-11-04 LAB — TROPONIN I (HIGH SENSITIVITY): Troponin I (High Sensitivity): <2 ng/L (ref <18)

## 2022-11-04 NOTE — ED Triage Notes (Signed)
Pt to ED for chest pain x2 months, reports today it feels sharper. Also reports both hands feel tingly. States doctor found new heart murmer and has been referred to cardiology.  +nausea

## 2022-11-04 NOTE — ED Provider Notes (Signed)
[facility] Provider Note    Event Date/Time   First MD Initiated Contact with Patient 11/04/22 2302     (approximate)   History   Chief Complaint: Chest Pain   HPI  Julie [name] is a 47 y.o. female with a past history of anxiety and depression and GERD who comes ED complaining of central chest pain for the past 2 months.  It is intermittent, not occurring every day.  No particular aggravating or alleviating factors.  No specific pattern as far as time of day or activity or position.  Not exertional, not pleuritic.  No shortness of breath diaphoresis or vomiting.  Has seen her PCP who referred her to cardiology, has an appointment in August.  Today it felt like the pain was more intense so she comes to the ED for evaluation.  She is also having some tingliness in both hands.  No fever     Physical Exam   Triage Vital Signs: ED Triage Vitals  Enc Vitals Group     BP 11/04/22 1653 125/84     Pulse Rate 11/04/22 1653 83     Resp 11/04/22 1653 18     Temp 11/04/22 1653 98.1 F (36.7 C)     Temp Source 11/04/22 1653 Oral     SpO2 11/04/22 1653 98 %     Weight 11/04/22 1657 (!) 307 lb (139.3 kg)     Height 11/04/22 1657 5\' 7"  (1.702 m)     Head Circumference --      Peak Flow --      Pain Score 11/04/22 1657 8     Pain Loc --      Pain Edu? --      Excl. in GC? --     Most recent vital signs: Vitals:   11/04/22 1653  BP: 125/84  Pulse: 83  Resp: 18  Temp: 98.1 F (36.7 C)  SpO2: 98%    General: Awake, no distress.  CV:  Good peripheral perfusion.  Regular rate and rhythm.  Normal distal pulses Resp:  Normal effort.  Clear to auscultation bilaterally Abd:  No distention.  Soft nontender Other:  No lower extremity edema or calf tenderness.  Symmetric calf circumference   ED Results / Procedures / Treatments   Labs (all labs ordered are listed, but only abnormal results are displayed) Labs Reviewed  BASIC METABOLIC PANEL - Abnormal;  Notable for the following components:      Result Value   Calcium 8.6 (*)    All other components within normal limits  CBC  D-DIMER, QUANTITATIVE  TROPONIN I (HIGH SENSITIVITY)  TROPONIN I (HIGH SENSITIVITY)     EKG Interpreted by me Normal sinus rhythm rate of 85.  Normal axis intervals QRS ST segments and T waves.   RADIOLOGY Chest x-ray interpreted by me, appears normal.  Radiology report reviewed   PROCEDURES:  Procedures   MEDICATIONS ORDERED IN ED: Medications - No data to display   IMPRESSION / MDM / ASSESSMENT AND PLAN / ED COURSE  I reviewed the triage vital signs and the nursing notes.  DDx: GERD, paroxysmal arrhythmia, non-STEMI, pneumonia, electrolyte abnormality, anemia  Patient's presentation is most consistent with acute presentation with potential threat to life or bodily function.  Patient presents with atypical chest pain for the past 2 months.  Unlikely to be cardiac but with the duration of symptoms, lab workup was undertaken which is all normal.  Also obtain D-dimer which is normal.  Low suspicion for DVT or PE given her reassuring exam and normal vital signs.  Stable for discharge and continued outpatient follow-up with cardiology.       FINAL CLINICAL IMPRESSION(S) / ED DIAGNOSES   Final diagnoses:  Atypical chest pain     Rx / DC Orders   ED Discharge Orders          Ordered    Ambulatory referral to Cardiology       Comments: If you have not heard from the Cardiology office within the next 72 hours please call [phone].   11/05/22 0035             Note:  This document was prepared using Dragon voice recognition software and may include unintentional dictation errors.   Sharman Cheek, MD 11/05/22 [phone]

## 2022-11-05 LAB — TROPONIN I (HIGH SENSITIVITY): Troponin I (High Sensitivity): <2 ng/L (ref <18)

## 2022-11-05 LAB — D-DIMER, QUANTITATIVE: D-Dimer, Quant: 0.42 ug/mL-FEU (ref 0.00–0.50)

## 2022-11-05 NOTE — Discharge Instructions (Signed)
Your EKG, chest xray, and lab tests were all okay today.  Keep taking your medications as usual and keep your upcoming appointment with Dr. Kirke Corin.

## 2022-11-05 NOTE — ED Notes (Addendum)
Pt given snack and sprite. Son at bedside

## 2022-11-10 ENCOUNTER — Encounter: Payer: Self-pay | Admitting: Podiatry

## 2022-11-11 ENCOUNTER — Other Ambulatory Visit: Payer: Self-pay

## 2022-11-11 ENCOUNTER — Encounter: Payer: Self-pay | Admitting: Cardiovascular Disease

## 2022-11-11 ENCOUNTER — Ambulatory Visit: Payer: 59 | Attending: Cardiovascular Disease | Admitting: Cardiovascular Disease

## 2022-11-11 VITALS — BP 136/80 | HR 85 | Ht 67.0 in | Wt 303.8 lb

## 2022-11-11 DIAGNOSIS — R072 Precordial pain: Secondary | ICD-10-CM | POA: Diagnosis not present

## 2022-11-11 DIAGNOSIS — R011 Cardiac murmur, unspecified: Secondary | ICD-10-CM | POA: Diagnosis not present

## 2022-11-11 MED ORDER — METOPROLOL TARTRATE 100 MG PO TABS
ORAL_TABLET | ORAL | 0 refills | Status: DC
  Filled 2022-11-11: qty 1, 1d supply, fill #0

## 2022-11-11 NOTE — Progress Notes (Signed)
Cardiology Office Note   Date:  11/11/2022   ID:  Julie [name], DOB 1975/11/06, MRN [id number]  PCP:  Assunta Found, MD  Cardiologist:   Lorine Bears, MD   Chief Complaint  Patient presents with   Follow-up    Newly found aortic heart murmur. Following up from ED for chest pain. Meds reviewed.       GERD, present Illness: Julie [name] is a 47 y.o. female who presents for for evaluation of chest pain and shortness of breath. She has past medical history of GERD, obesity and anxiety.  She quit smoking 13 years ago.  She works as a Clinical biochemist. She was seen in 2021 by Dr. Cristal Deer for chest pain.  She underwent a treadmill stress test which showed no evidence of ischemia. \Over the last few months, she experienced intermittent episodes of substernal chest pain.  The pain is described as sharp discomfort radiating to left arm.  She also has a squeezing sensation with left arm tingling.  These episodes can happen at rest or with exertion.  She noticed significant worsening of exertional dyspnea. She had an intense episode recently and went to the emergency room for evaluation.  EKG showed no ischemic changes.  Troponin was normal and D-dimer was in the normal range.  She has occasional palpitations but no prolonged tachycardia.  Past Medical History:  Diagnosis Date   Anemia    Anemia    Anxiety    Breast discharge 07/01/2013   Pt noticed discharge, none on exam today will check TSH and Prolactin   Breast mass, right 10/20/2012   Depression    Dysmenorrhea 09/19/2015   Dyspareunia in female 09/19/2015   Ectopic pregnancy    Eczema    GERD (gastroesophageal reflux disease)    no meds   Headache    Hernia of abdominal wall 01/04/2013   Kidney stone    Left breast mass 04/12/2014   Has pea sized nodule at 6 o'clock tender and mobile, will get mammogram and [place] if needed   Menorrhagia with regular cycle 09/19/2015   Pelvic pain in female 09/19/2015   Placenta previa 2012    Current pregnancy    Urinary tract infection    Vaginal discharge 07/01/2013    Past Surgical History:  Procedure Laterality Date   ABDOMINAL HYSTERECTOMY N/A 10/10/2015   Procedure: HYSTERECTOMY ABDOMINAL;  Surgeon: Lazaro Arms, MD;  Location: [facility];  Service: Gynecology;  Laterality: N/A;   APPENDECTOMY     APPENDECTOMY     BILATERAL SALPINGECTOMY Bilateral 10/10/2015   Procedure: BILATERAL SALPINGECTOMY;  Surgeon: Lazaro Arms, MD;  Location: [facility];  Service: Gynecology;  Laterality: Bilateral;   BREAST BIOPSY Right 11/04/2012   hylanized tissue with minimal chronic inflammatory tissue   BREAST BIOPSY Right 07/16/2021   CESAREAN SECTION  04/24/2011   Procedure: CESAREAN SECTION;  Surgeon: Lazaro Arms, MD;  Location: [facility];  Service: Gynecology;  Laterality: N/A;  Primary/Previa   CHOLECYSTECTOMY     COLONOSCOPY     CYSTOSCOPY W/ URETERAL STENT PLACEMENT Left 08/04/2014   Procedure: CYSTOSCOPY WITH LEFT  RETROGRADE PYELOGRAM/ LEFT URETERAL STENT PLACEMENT;  Surgeon: Jerilee Field, MD;  Location: [facility];  Service: Urology;  Laterality: Left;   TONSILLECTOMY     UPPER GI ENDOSCOPY       Current Outpatient Medications  Medication Sig Dispense Refill   ALPRAZolam (XANAX) 1 MG tablet Take 1 tablet (1 mg total) by mouth 3 (three) times  daily. 90 tablet 1   Ascorbic Acid (VITAMIN C) 1000 MG tablet Take 1,000 mg by mouth daily.     buPROPion (WELLBUTRIN XL) 150 MG 24 hr tablet Take 1 tablet (150 mg total) by mouth daily. 90 tablet 3   levocetirizine (XYZAL) 5 MG tablet Take 1 tablet (5 mg total) by mouth daily. 90 tablet 3   omeprazole (PRILOSEC) 20 MG capsule Take 1 capsule (20 mg total) by mouth 2 (two) times daily. 180 capsule 2   sertraline (ZOLOFT) 100 MG tablet Take 1 tablet (100 mg total) by mouth every morning. 30 tablet 5   sertraline (ZOLOFT) 50 MG tablet Take 1 tablet (50 mg total) by mouth daily. Take with 100 mg tablet 90 tablet 4   ALPRAZolam (XANAX) 1 MG tablet  Take 1 tablet (1 mg total) by mouth 3 (three) times daily. (Patient not taking: Reported on 11/11/2022) 90 tablet 2   buPROPion (WELLBUTRIN XL) 150 MG 24 hr tablet Take 1 tablet (150 mg total) by mouth daily. (Patient not taking: Reported on 11/11/2022) 90 tablet 2   celecoxib (CELEBREX) 200 MG capsule Take 1 capsule (200 mg total) by mouth 2 (two) times daily. (Patient not taking: Reported on 11/11/2022) 60 capsule 3   cephALEXin (KEFLEX) 500 MG capsule Take 1 capsule (500 mg total) by mouth 3 (three) times daily. (Patient not taking: Reported on 11/11/2022) 30 capsule 0   doxycycline (VIBRA-TABS) 100 MG tablet Take 1  tablet by mouth 2 (two) times daily. (Patient not taking: Reported on 11/11/2022) 20 tablet 0   eletriptan (RELPAX) 40 MG tablet Take 1 tablet (40 mg total) by mouth daily as needed for headache (Patient not taking: Reported on 11/11/2022) 12 tablet 5   furosemide (LASIX) 40 MG tablet Take 40 mg by mouth as needed.  (Patient not taking: Reported on 11/11/2022)     Galcanezumab-gnlm (EMGALITY) 120 MG/ML SOAJ Inject 120 mg into the skin every 28 (twenty-eight) days. (Patient not taking: Reported on 11/11/2022) 1 mL 5   methylPREDNISolone (MEDROL DOSEPAK) 4 MG TBPK tablet 6 day dose pack - take as directed (Patient not taking: Reported on 11/11/2022) 21 tablet 0   omeprazole (PRILOSEC) 20 MG capsule Take 1 capsule (20 mg total) by mouth 2 (two) times daily. (Patient not taking: Reported on 11/11/2022) 180 capsule 3   ondansetron (ZOFRAN) 4 MG tablet Take 1 tablet (4 mg total) by mouth every 8 (eight) hours as needed. (Patient not taking: Reported on 11/11/2022) 20 tablet 0   oxybutynin (DITROPAN) 5 MG tablet Take 1 tablet (5 mg total) by mouth daily. (Patient not taking: Reported on 11/11/2022) 30 tablet 10   Potassium 99 MG TABS Take by mouth. (Patient not taking: Reported on 11/11/2022)     sertraline (ZOLOFT) 100 MG tablet Take 1 tablet (100 mg total) by mouth every morning. (Patient not taking: Reported on  11/11/2022) 90 tablet 2   sertraline (ZOLOFT) 100 MG tablet Take 1 tablet (100 mg total) by mouth every morning. (Patient not taking: Reported on 11/11/2022) 90 tablet 2   sertraline (ZOLOFT) 50 MG tablet Take 1 tablet (50 mg total) by mouth daily. Take with 100mg  tablet (Patient not taking: Reported on 11/11/2022) 90 tablet 2   Vitamin D, Ergocalciferol, (DRISDOL) 1.25 MG (50000 UNIT) CAPS capsule 1 Capsule(s) once a week then 2000 units otc daily 12 capsule 0   No current facility-administered medications for this visit.    Allergies:   Biaxin [clarithromycin], Clarithromycin, Topamax [topiramate], and Tramadol  Social History:  The patient  reports that she quit smoking about 13 years ago. Her smoking use included cigarettes. She has never used smokeless tobacco. She reports current alcohol use. She reports that she does not use drugs.   Family History:  The patient's family history includes Asthma in her father and mother; Atrial fibrillation in her maternal grandmother and mother; Bipolar disorder in her mother; Breast cancer in her paternal grandmother; Cancer in her maternal grandmother, mother, and paternal grandmother; Depression in her mother; Diabetes in her paternal grandmother; Heart disease in her maternal grandfather, maternal grandmother, and paternal grandmother; Heart failure in her paternal grandmother; Hypertension in her father, maternal grandmother, and paternal grandfather; Kidney disease in her paternal grandmother; Other in her maternal grandmother and paternal grandfather; Pancreatitis in her maternal grandfather; Seizures in her mother; Stroke in her paternal grandfather; Urticaria in her son.    ROS:  Please see the history of present illness.   Otherwise, review of systems are positive for none.   All other systems are reviewed and negative.    PHYSICAL EXAM: VS:  BP 136/80 (BP Location: Left Arm, Patient Position: Sitting, Cuff Size: Large)   Pulse 85   Ht 5\' 7"  (1.702  m)   Wt (!) 303 lb 12.8 oz (137.8 kg)   LMP 09/26/2015 (Exact Date)   SpO2 98%   BMI 47.58 kg/m  , BMI Body mass index is 47.58 kg/m. GEN: Well nourished, well developed, in no acute distress  HEENT: normal  Neck: no JVD, carotid bruits, or masses Cardiac: RRR; no rubs, or gallops,no edema .  1 out of 6 systolic murmur in the aortic area Respiratory:  clear to auscultation bilaterally, normal work of breathing GI: soft, nontender, nondistended, + BS MS: no deformity or atrophy  Skin: warm and dry, no rash Neuro:  Strength and sensation are intact Psych: euthymic mood, full affect   EKG:  EKG is ordered today. The ekg ordered today demonstrates normal sinus rhythm with no significant ST or T wave changes.   Recent Labs: 11/04/2022: BUN 16; Creatinine, Ser 0.87; Hemoglobin 12.2; Platelets 197; Potassium 3.6; Sodium 139    Lipid Panel No results found for: "CHOL", "TRIG", "HDL", "CHOLHDL", "VLDL", "LDLCALC", "LDLDIRECT"    Wt Readings from Last 3 Encounters:  11/11/22 (!) 303 lb 12.8 oz (137.8 kg)  11/04/22 (!) 307 lb (139.3 kg)  02/13/22 (!) 308 lb (139.7 kg)           No data to display            ASSESSMENT AND PLAN:  1.  Chest pain with some atypical and some exertional anginal features: EKG is normal.  She is not able to exercise on a treadmill.  I recommend ischemic cardiac evaluation with cardiac CTA.  2.  Cardiac murmur with significant shortness of breath: I requested an echocardiogram for evaluation.    Disposition:   FU as needed if cardiac testing is abnormal.  Signed,  Lorine Bears, MD  11/11/2022 1:45 PM    [facility]

## 2022-11-11 NOTE — Patient Instructions (Signed)
Medication Instructions:  No changes *If you need a refill on your cardiac medications before your next appointment, please call your pharmacy*   Lab Work: None ordered If you have labs (blood work) drawn today and your tests are completely normal, you will receive your results only by: MyChart Message (if you have MyChart) OR A paper copy in the mail If you have any lab test that is abnormal or we need to change your treatment, we will call you to review the results.   Testing/Procedures: Your physician has requested that you have an echocardiogram. Echocardiography is a painless test that uses sound waves to create images of your heart. It provides your doctor with information about the size and shape of your heart and how well your heart's chambers and valves are working.   You may receive an ultrasound enhancing agent through an IV if needed to better visualize your heart during the echo. This procedure takes approximately one hour.  There are no restrictions for this procedure.  This will take place at [address] [address], [place] [address]    Follow-Up: At [facility], you and your health needs are our priority.  As part of our continuing mission to provide you with exceptional heart care, we have created designated Provider Care Teams.  These Care Teams include your primary Cardiologist (physician) and Advanced Practice Providers (APPs -  Physician Assistants and Nurse Practitioners) who all work together to provide you with the care you need, when you need it.  We recommend signing up for the patient portal called "MyChart".  Sign up information is provided on this After Visit Summary.  MyChart is used to connect with patients for Virtual Visits (Telemedicine).  Patients are able to view lab/test results, encounter notes, upcoming appointments, etc.  Non-urgent messages can be sent to your provider as well.   To learn more about what you can  do with MyChart, go to [email or web address].    Your next appointment:   Follow up as needed with Dr. Kirke Corin  Other Instructions   Your cardiac CT will be scheduled at one of the below locations:   [facility] [address] [place], [place] [address] [phone]  OR  [facility] [address] [place], [place] [address] [phone]  OR   [facility] [address] [place], [place] [address] [phone]  If scheduled at [facility], please arrive at the [facility] (Entrance C2) of [facility] 30 minutes prior to test start time. You can use the FREE valet parking offered at entrance C (encouraged to control the heart rate for the test)  Proceed to the [facility] Radiology Department (first floor) to check-in and test prep.  All radiology patients and guests should use entrance C2 at [facility], accessed from [facility], even though the hospital's physical address listed is [address].    If scheduled at [facility] or [facility], please arrive 15 mins early for check-in and test prep.   Please follow these instructions carefully (unless otherwise directed):  On the Night Before the Test: Be sure to Drink plenty of water. Do not consume any caffeinated/decaffeinated beverages or chocolate 12 hours prior to your test. Do not take any antihistamines 12 hours prior to your test.  On the Day of the Test: Drink plenty of water until 1 hour  prior to the test. Do not eat any food 1 hour prior to test. You may take your regular medications prior to the test.  Take metoprolol (Lopressor) two hours prior to test. If you take Furosemide/Hydrochlorothiazide/Spironolactone, please HOLD on the morning of the test. FEMALES- please wear underwire-free bra if available, avoid  dresses & tight clothing       After the Test: Drink plenty of water. After receiving IV contrast, you may experience a mild flushed feeling. This is normal. On occasion, you may experience a mild rash up to 24 hours after the test. This is not dangerous. If this occurs, you can take Benadryl 25 mg and increase your fluid intake. If you experience trouble breathing, this can be serious. If it is severe call 911 IMMEDIATELY. If it is mild, please call our office. If you take any of these medications: Glipizide/Metformin, Avandament, Glucavance, please do not take 48 hours after completing test unless otherwise instructed.  We will call to schedule your test 2-4 weeks out understanding that some insurance companies will need an authorization prior to the service being performed.   For non-scheduling related questions, please contact the cardiac imaging nurse navigator should you have any questions/concerns: Rockwell Alexandria, Cardiac Imaging Nurse Navigator Larey Brick, Cardiac Imaging Nurse Navigator [facility] Direct Office Dial: [phone]   For scheduling needs, including cancellations and rescheduling, please call [place], [phone].

## 2022-11-13 NOTE — Addendum Note (Signed)
Addended by: Auburn Bilberry D on: 11/13/2022 04:09 PM   Modules accepted: Orders

## 2022-11-14 ENCOUNTER — Other Ambulatory Visit: Payer: Self-pay

## 2022-11-17 ENCOUNTER — Telehealth (HOSPITAL_COMMUNITY): Payer: Self-pay | Admitting: Emergency Medicine

## 2022-11-17 NOTE — Telephone Encounter (Signed)
Reaching out to patient to offer assistance regarding upcoming cardiac imaging study; pt verbalizes understanding of appt date/time, parking situation and where to check in, pre-test NPO status and medications ordered, and verified current allergies; name and call back number provided for further questions should they arise Mark Hassey RN Navigator Cardiac Imaging [facility] [phone] office [phone] cell 

## 2022-11-18 ENCOUNTER — Ambulatory Visit (HOSPITAL_COMMUNITY)
Admission: RE | Admit: 2022-11-18 | Discharge: 2022-11-18 | Disposition: A | Discharge status: Home / Self Care | Payer: 59 | Source: Ambulatory Visit | Attending: Cardiovascular Disease | Admitting: Cardiovascular Disease

## 2022-11-18 DIAGNOSIS — R072 Precordial pain: Secondary | ICD-10-CM | POA: Diagnosis not present

## 2022-11-18 MED ORDER — NITROGLYCERIN 0.4 MG SL SUBL
SUBLINGUAL_TABLET | SUBLINGUAL | Status: AC
  Filled 2022-11-18: qty 2

## 2022-11-18 MED ORDER — IOHEXOL 350 MG/ML SOLN
115.0000 mL | Freq: Once | INTRAVENOUS | Status: AC | PRN
  Administered 2022-11-18: 115 mL via INTRAVENOUS

## 2022-11-18 MED ORDER — NITROGLYCERIN 0.4 MG SL SUBL
0.8000 mg | SUBLINGUAL_TABLET | Freq: Once | SUBLINGUAL | Status: AC
  Administered 2022-11-18: 0.8 mg via SUBLINGUAL

## 2022-11-19 ENCOUNTER — Ambulatory Visit: Payer: 59 | Admitting: Podiatry

## 2022-11-19 ENCOUNTER — Encounter: Payer: Self-pay | Admitting: Cardiovascular Disease

## 2022-11-20 ENCOUNTER — Other Ambulatory Visit: Payer: Self-pay

## 2022-11-24 ENCOUNTER — Other Ambulatory Visit: Payer: Self-pay

## 2022-11-26 ENCOUNTER — Ambulatory Visit (INDEPENDENT_AMBULATORY_CARE_PROVIDER_SITE_OTHER): Payer: 59 | Admitting: Podiatry

## 2022-11-26 DIAGNOSIS — S86311D Strain of muscle(s) and tendon(s) of peroneal muscle group at lower leg level, right leg, subsequent encounter: Secondary | ICD-10-CM

## 2022-11-26 NOTE — Progress Notes (Signed)
Patient presents today to pick up custom molded foot orthotics, diagnosed with peroneal tendon repair by Dr. Al Corpus.   Orthotics were dispensed and fit was satisfactory. Reviewed instructions for break-in and wear. Written instructions given to patient.  Patient will follow up as needed.

## 2022-12-02 ENCOUNTER — Ambulatory Visit: Payer: 59 | Attending: Cardiovascular Disease

## 2022-12-02 DIAGNOSIS — R011 Cardiac murmur, unspecified: Secondary | ICD-10-CM

## 2022-12-02 LAB — ECHOCARDIOGRAM COMPLETE
AR max vel: 2.5 cm2
AV Area VTI: 2.78 cm2
AV Area mean vel: 2.53 cm2
AV Mean grad: 7 mmHg
AV Peak grad: 13 mmHg
Ao pk vel: 1.8 m/s
Area-P 1/2: 3.85 cm2
Calc EF: 56.8 %
S' Lateral: 3.3 cm
Single Plane A2C EF: 58.6 %
Single Plane A4C EF: 55.4 %

## 2022-12-05 ENCOUNTER — Encounter: Payer: Self-pay | Admitting: Cardiovascular Disease

## 2022-12-15 ENCOUNTER — Other Ambulatory Visit: Payer: Self-pay

## 2022-12-28 ENCOUNTER — Encounter: Payer: Self-pay | Admitting: Cardiovascular Disease

## 2022-12-28 DIAGNOSIS — R002 Palpitations: Secondary | ICD-10-CM

## 2022-12-29 ENCOUNTER — Ambulatory Visit: Payer: 59 | Attending: Cardiovascular Disease

## 2022-12-29 DIAGNOSIS — R002 Palpitations: Secondary | ICD-10-CM

## 2022-12-31 DIAGNOSIS — R002 Palpitations: Secondary | ICD-10-CM

## 2023-01-05 ENCOUNTER — Other Ambulatory Visit: Payer: Self-pay

## 2023-01-05 ENCOUNTER — Other Ambulatory Visit (HOSPITAL_COMMUNITY): Payer: Self-pay

## 2023-01-07 ENCOUNTER — Other Ambulatory Visit: Payer: 59

## 2023-01-09 DIAGNOSIS — G43009 Migraine without aura, not intractable, without status migrainosus: Secondary | ICD-10-CM | POA: Diagnosis not present

## 2023-01-14 DIAGNOSIS — R002 Palpitations: Secondary | ICD-10-CM | POA: Diagnosis not present

## 2023-01-21 ENCOUNTER — Telehealth: Payer: Self-pay

## 2023-01-21 ENCOUNTER — Other Ambulatory Visit (HOSPITAL_COMMUNITY): Payer: Self-pay

## 2023-01-21 ENCOUNTER — Other Ambulatory Visit: Payer: Self-pay

## 2023-01-21 ENCOUNTER — Encounter: Payer: Self-pay | Admitting: Neurology

## 2023-01-21 ENCOUNTER — Ambulatory Visit: Payer: 59 | Admitting: Neurology

## 2023-01-21 ENCOUNTER — Encounter: Payer: Self-pay | Admitting: Cardiovascular Disease

## 2023-01-21 VITALS — BP 126/83 | HR 85 | Ht 67.0 in | Wt 313.0 lb

## 2023-01-21 DIAGNOSIS — G43711 Chronic migraine without aura, intractable, with status migrainosus: Secondary | ICD-10-CM

## 2023-01-21 MED ORDER — ONDANSETRON HCL 4 MG PO TABS
4.0000 mg | ORAL_TABLET | Freq: Three times a day (TID) | ORAL | 5 refills | Status: DC | PRN
  Filled 2023-01-21 (×2): qty 20, 7d supply, fill #0
  Filled 2023-11-12: qty 20, 7d supply, fill #1

## 2023-01-21 MED ORDER — SUMATRIPTAN SUCCINATE 6 MG/0.5ML ~~LOC~~ SOLN
6.0000 mg | SUBCUTANEOUS | 5 refills | Status: DC | PRN
  Filled 2023-01-21: qty 2.5, 10d supply, fill #0
  Filled 2023-01-21: qty 3, 30d supply, fill #0

## 2023-01-21 MED ORDER — PREDNISONE 10 MG PO TABS: ORAL_TABLET | ORAL | 0 refills | Status: DC

## 2023-01-21 MED ORDER — ONDANSETRON 4 MG PO TBDP
4.0000 mg | ORAL_TABLET | Freq: Three times a day (TID) | ORAL | 5 refills | Status: AC | PRN
  Filled 2023-01-21 (×2): qty 20, 7d supply, fill #0
  Filled 2023-11-12: qty 20, 7d supply, fill #1

## 2023-01-21 NOTE — Progress Notes (Signed)
NEUROLOGY FOLLOW UP OFFICE NOTE  Julie [name] [id number]  Assessment/Plan:   Chronic migraine without aura, with status migrainosus, intractable  Over 15 headache days a month for over 3 months.  Failed Aimovig, nortriptyline, topiramate, zonisamide Prednisone taper to try and abort current intractable migraine Migraine prevention:  Plan to restart Botox Migraine rescue:  sumatriptan 6mg  Kennedy.  Zofran for nausea.  Stop OTC analgesics. Limit use of pain relievers to no more than 2 days out of week to prevent risk of rebound or medication-overuse headache.   Subjective:  Julie [name] is a 47 year old female with depression, anxiety and kidney stone who follows up for migraine.   UPDATE: Due to worsening headaches, she had an MRI of brain with and without contrast on 03/03/2022 which was personally reviewed and was unremarkable.  Last seen in September.  Plan was to discontinue Aimovig and nortriptyline and start Emgality.  It was approved by her insurance but she never .  She tried Zavzpret samples which helped but made her vomit.  Headaches remained controlled until April.  She had been averaging 3 headache days a week.  However, she has kept a constant headache for the past 3 weeks.  She has been taking ibuprofen which helps take the edge off.  Toradol shot ineffective.   Current NSAIDS: None Current analgesics: Excedrin Migraine (rarely, ineffective) Current triptans: None Current ergotamine: None Current anti-emetic: None Current muscle relaxants: none Current anti-anxiolytic: Alprazolam 1 mg as needed Current sleep aide: None Current Antihypertensive medications: None Current Antidepressant medications: sertraline  Current Anticonvulsant medications: none Current anti-CGRP: none Current Vitamins/Herbal/Supplements: None Current Antihistamines/Decongestants: None Other therapy: Massage therapy, Daith piercing bilaterally, chiropractor Hormone/birth control:  progesterone   Caffeine: One cup of coffee daily, tea at dinner Alcohol: Occasional Smoker: No Diet: 4 glasses of water daily; does not eat breakfast. Exercise: Not routine Depression: Yes; Anxiety: Yes Other pain: Diffuse body aches Sleep hygiene: Sleeps but not rested.  Sleep study negative for OSA.   HISTORY: Onset: She developed migraines in high school but resolved.  Migraines returned at age 74 years old after birth of her son. Location:  Back of head bilaterally, top of head, radiating down neck into shoulders Quality:  Vice-grip Initial intensity:  5-10/10 (usually 10/10).  She denies new headache, thunderclap headache Aura:  Sometimes phantosmia (chemicals, exhaust, burning) Prodrome:  no Postdrome:  fatigue Associated symptoms: Nausea, photophobia, phonophobia, blurred vision.  She denies associated unilateral numbness or weakness. Initial duration:  Constant. Often wakes up with it. Initial Frequency:  daily Initial Frequency of abortive medication: none Triggers: None Relieving factors:  Applying pressure to upper paraspinal region, resting in dark and quiet place Activity:  aggravates   Workup included MRI of brain without contrast from 10//19/16, which was personally reviewed and was unremarkable.  Endorsed neck pain with bilateral upper extremity pain, numbness and weakness.  Started on gabapentin which was ineffective and was subsequently switched to nortriptyline.  MRI of cervical spine on 06/19/2020 revealed mild multilevel degenerative changes with right paracentral disc protrusion at C3-4 abutting the ventral cord and mild right neural foraminal narrowing at C3-4 and C4-5 but no significant spinal canal stenosis.   Past NSAIDS:  naproxen, indomethacin 50mg  three times daily, Toradol shot (helps) Past analgesics:  Fioricet with codeine, Excedrin, Tylenol Past abortive triptans:  Sumatriptan 100mg  (made her sick), rizatriptan 10mg , eletriptan 40mg  Past abortive  ergotamine:  Cafergot (caused throat swelling) Past muscle relaxants:  Flexeril, tizanidine 4mg  QHS Past anti-emetic:  Zofran (  sometimes helps) Past antihypertensive medications:  Propranolol 80mg  twice daily Past antidepressant medications:  Amitriptyline 25mg  at bedtime, nortriptyline, Wellbutrin Past anticonvulsant medications:  topiramate 150mg , zonisamide 25mg  at bedtime, gabapentin 400mg  QHS Past anti-CGRP:  Aimovig, Nurtec, Ubrelvy, Zavzpret NS Past vitamins/Herbal/Supplements:  none Past antihistamines/decongestants:  none Other past therapies:  Trigger point/nerve blocks, cognitive behavioral therapy, acupuncture, biofeedback, Botox (stopped due to cost related to change in insurance), Reyvow   Family history of headache:  No   PAST MEDICAL HISTORY: Past Medical History:  Diagnosis Date   Anemia    Anemia    Anxiety    Breast discharge 07/01/2013   Pt noticed discharge, none on exam today will check TSH and Prolactin   Breast mass, right 10/20/2012   Depression    Dysmenorrhea 09/19/2015   Dyspareunia in female 09/19/2015   Ectopic pregnancy    Eczema    GERD (gastroesophageal reflux disease)    no meds   Headache    Hernia of abdominal wall 01/04/2013   Kidney stone    Left breast mass 04/12/2014   Has pea sized nodule at 6 o'clock tender and mobile, will get mammogram and [place] if needed   Menorrhagia with regular cycle 09/19/2015   Pelvic pain in female 09/19/2015   Placenta previa 2012   Current pregnancy    Urinary tract infection    Vaginal discharge 07/01/2013    MEDICATIONS: Current Outpatient Medications on File Prior to Visit  Medication Sig Dispense Refill   ALPRAZolam (XANAX) 1 MG tablet Take 1 tablet (1 mg total) by mouth 3 (three) times daily. 90 tablet 2   Ascorbic Acid (VITAMIN C) 1000 MG tablet Take 1,000 mg by mouth daily.     levocetirizine (XYZAL) 5 MG tablet Take 1 tablet (5 mg total) by mouth daily. 90 tablet 3   omeprazole (PRILOSEC) 20 MG capsule  Take 1 capsule (20 mg total) by mouth 2 (two) times daily. 180 capsule 3   sertraline (ZOLOFT) 100 MG tablet Take 1 tablet (100 mg total) by mouth every morning. 90 tablet 2   sertraline (ZOLOFT) 50 MG tablet Take 1 tablet (50 mg total) by mouth daily. Take with 100 mg tablet 90 tablet 4   buPROPion (WELLBUTRIN XL) 150 MG 24 hr tablet Take 1 tablet (150 mg total) by mouth daily. (Patient not taking: Reported on 11/11/2022) 90 tablet 2   No current facility-administered medications on file prior to visit.      ALLERGIES: Allergies  Allergen Reactions   Biaxin [Clarithromycin]    Clarithromycin Other (See Comments)    Makes throat raw   Topamax [Topiramate] Itching   Tramadol     FAMILY HISTORY: Family History  Problem Relation Age of Onset   Depression Mother    Cancer Mother    Seizures Mother    Bipolar disorder Mother    Asthma Mother    Atrial fibrillation Mother    Hypertension Father    Asthma Father    Heart disease Maternal Grandmother    Cancer Maternal Grandmother        oral; lymph nodes   Hypertension Maternal Grandmother    Other Maternal Grandmother        memory problems   Atrial fibrillation Maternal Grandmother    Heart disease Maternal Grandfather    Pancreatitis Maternal Grandfather    Cancer Paternal Grandmother        breast   Heart disease Paternal Grandmother    Kidney disease Paternal Grandmother  kidney failure   Diabetes Paternal Grandmother    Breast cancer Paternal Grandmother    Heart failure Paternal Grandmother    Stroke Paternal Grandfather    Hypertension Paternal Grandfather    Other Paternal Grandfather        brain tumors   Urticaria Son    Allergic rhinitis Neg Hx    Angioedema Neg Hx    Eczema Neg Hx    Immunodeficiency Neg Hx       Objective:  Blood pressure 126/83, pulse 85, height 5\' 7"  (1.702 m), weight (!) 313 lb (142 kg), last menstrual period 09/26/2015, SpO2 99%. General: No acute distress.  Patient appears  well-groomed.   Head:  Normocephalic/atraumatic Neck:  Supple.  No paraspinal tenderness.  Full range of motion. Heart:  Regular rate and rhythm. Neuro:  Alert and oriented.  Speech fluent and not dysarthric.  Language intact.  CN II-XII intact.  Bulk and tone normal.  Muscle strength 5/5 throughout.  Deep tendon reflexes 2+ throughout.  Gait normal.  Romberg negative.     Shon Millet, DO  CC: Assunta Found, MD

## 2023-01-21 NOTE — Telephone Encounter (Signed)
Patient seen in office today, Patient to start Botox.   PA team please start PA for Botox 200 units every 90 days

## 2023-01-21 NOTE — Patient Instructions (Addendum)
Plan to start botox Take sumatriptan injection earliest onset.  May repeat after 1 hour. Zofran for nausea Prednisone taper Limit use of pain relievers to no more than 2 days out of week to prevent risk of rebound or medication-overuse headache. Keep headache diary

## 2023-01-22 ENCOUNTER — Ambulatory Visit: Payer: 59 | Admitting: Cardiovascular Disease

## 2023-02-04 ENCOUNTER — Other Ambulatory Visit (HOSPITAL_COMMUNITY): Payer: Self-pay

## 2023-02-04 ENCOUNTER — Telehealth: Payer: Self-pay

## 2023-02-04 DIAGNOSIS — G43711 Chronic migraine without aura, intractable, with status migrainosus: Secondary | ICD-10-CM

## 2023-02-04 NOTE — Telephone Encounter (Signed)
Pharmacy Patient Advocate Encounter   Received notification from Pt Calls Messages that prior authorization for Botox 200UNIT solution is required/requested.   Insurance verification completed.   The patient is insured through [facility] .   Per test claim: PA required; PA submitted to [facility] via CoverMyMeds Key/confirmation #/EOC [id number] Status is pending

## 2023-02-04 NOTE — Telephone Encounter (Signed)
PA request has been Submitted. New Encounter created for follow up. For additional info see Pharmacy Prior Auth telephone encounter from 02-04-2023.

## 2023-02-09 DIAGNOSIS — U071 COVID-19: Secondary | ICD-10-CM | POA: Diagnosis not present

## 2023-02-09 DIAGNOSIS — I10 Essential (primary) hypertension: Secondary | ICD-10-CM | POA: Diagnosis not present

## 2023-02-09 DIAGNOSIS — J36 Peritonsillar abscess: Secondary | ICD-10-CM | POA: Diagnosis not present

## 2023-02-09 DIAGNOSIS — J039 Acute tonsillitis, unspecified: Secondary | ICD-10-CM | POA: Diagnosis not present

## 2023-02-09 DIAGNOSIS — J060 Acute laryngopharyngitis: Secondary | ICD-10-CM | POA: Diagnosis not present

## 2023-02-09 DIAGNOSIS — Z6841 Body Mass Index (BMI) 40.0 and over, adult: Secondary | ICD-10-CM | POA: Diagnosis not present

## 2023-02-09 DIAGNOSIS — J020 Streptococcal pharyngitis: Secondary | ICD-10-CM | POA: Diagnosis not present

## 2023-02-12 NOTE — Telephone Encounter (Signed)
Pharmacy Patient Advocate Encounter  Received notification from [facility] that Prior Authorization for Botox 200UNIT solution has been APPROVED from 02-06-2023 to 08-05-2023   PA #/Case ID/Reference #: [id number]

## 2023-02-13 ENCOUNTER — Other Ambulatory Visit (HOSPITAL_COMMUNITY): Payer: Self-pay

## 2023-02-13 MED ORDER — ONABOTULINUMTOXINA 200 UNITS IJ SOLR
INTRAMUSCULAR | 4 refills | Status: DC
Start: 2023-02-13 — End: 2024-01-10
  Filled 2023-02-13: qty 1, fill #0
  Filled 2023-02-13: qty 1, 84d supply, fill #0

## 2023-02-13 NOTE — Addendum Note (Signed)
Addended by: Leida Lauth on: 02/13/2023 10:49 AM   Modules accepted: Orders

## 2023-02-13 NOTE — Telephone Encounter (Signed)
Script sent to [facility]

## 2023-02-16 ENCOUNTER — Other Ambulatory Visit (HOSPITAL_COMMUNITY): Payer: Self-pay

## 2023-02-18 ENCOUNTER — Other Ambulatory Visit: Payer: Self-pay

## 2023-02-19 ENCOUNTER — Other Ambulatory Visit: Payer: Self-pay

## 2023-02-19 MED ORDER — ALPRAZOLAM 1 MG PO TABS
1.0000 mg | ORAL_TABLET | Freq: Three times a day (TID) | ORAL | 2 refills | Status: DC
  Filled 2023-02-19: qty 90, 30d supply, fill #0
  Filled 2023-03-26: qty 90, 30d supply, fill #1
  Filled 2023-05-20: qty 90, 30d supply, fill #2

## 2023-02-20 ENCOUNTER — Ambulatory Visit (INDEPENDENT_AMBULATORY_CARE_PROVIDER_SITE_OTHER): Payer: 59 | Admitting: Neurology

## 2023-02-20 DIAGNOSIS — G43711 Chronic migraine without aura, intractable, with status migrainosus: Secondary | ICD-10-CM

## 2023-02-20 MED ORDER — ONABOTULINUMTOXINA 100 UNITS IJ SOLR
200.0000 [IU] | Freq: Once | INTRAMUSCULAR | Status: AC
Start: 2023-02-20 — End: 2023-02-20
  Administered 2023-02-20: 155 [IU] via INTRAMUSCULAR

## 2023-02-20 NOTE — Progress Notes (Signed)
[facility]  ° °Procedure Note Botox ° °Attending: Dr. Elza Varricchio ° °Preoperative Diagnosis(es): Chronic migraine ° °Consent obtained from: The patient °Benefits discussed included, but were not limited to decreased muscle tightness, increased joint range of motion, and decreased pain.  Risk discussed included, but were not limited pain and discomfort, bleeding, bruising, excessive weakness, venous thrombosis, muscle atrophy and dysphagia.  Anticipated outcomes of the procedure as well as he risks and benefits of the alternatives to the procedure, and the roles and tasks of the personnel to be involved, were discussed with the patient, and the patient consents to the procedure and agrees to proceed. A copy of the patient medication guide was given to the patient which explains the blackbox warning. ° °Patients identity and treatment sites confirmed Yes.  . ° °Details of Procedure: °Skin was cleaned with alcohol. Prior to injection, the needle plunger was aspirated to make sure the needle was not within a blood vessel.  There was no blood retrieved on aspiration.   ° °Following is a summary of the muscles injected  And the amount of Botulinum toxin used: ° °Dilution °200 units of Botox was reconstituted with 4 ml of preservative free normal saline. °Time of reconstitution: At the time of the office visit (<30 minutes prior to injection)  ° °Injections  °155 total units of Botox was injected with a 30 gauge needle. ° °Injection Sites: °L occipitalis: 15 units- 3 sites  °R occiptalis: 15 units- 3 sites ° °L upper trapezius: 15 units- 3 sites °R upper trapezius: 15 units- 3 sits          °L paraspinal: 10 units- 2 sites °R paraspinal: 10 units- 2 sites ° °Face °L frontalis(2 injection sites):10 units   °R frontalis(2 injection sites):10 units         °L corrugator: 5 units   °R corrugator: 5 units           °Procerus: 5 units   °L temporalis: 20 units °R temporalis: 20 units  ° °Agent:  °200 units of botulinum Type  A (Onobotulinum Toxin type A) was reconstituted with 4 ml of preservative free normal saline.  °Time of reconstitution: At the time of the office visit (<30 minutes prior to injection)  ° ° ° Total injected (Units):  155 ° Total wasted (Units):  45 ° °Patient tolerated procedure well without complications.   °Reinjection is anticipated in 3 months. ° ° °

## 2023-02-25 ENCOUNTER — Other Ambulatory Visit (HOSPITAL_COMMUNITY): Payer: Self-pay

## 2023-02-25 ENCOUNTER — Other Ambulatory Visit: Payer: Self-pay

## 2023-02-25 DIAGNOSIS — Z0001 Encounter for general adult medical examination with abnormal findings: Secondary | ICD-10-CM | POA: Diagnosis not present

## 2023-02-25 DIAGNOSIS — Z6841 Body Mass Index (BMI) 40.0 and over, adult: Secondary | ICD-10-CM | POA: Diagnosis not present

## 2023-02-25 DIAGNOSIS — R0789 Other chest pain: Secondary | ICD-10-CM | POA: Diagnosis not present

## 2023-02-25 DIAGNOSIS — Z1331 Encounter for screening for depression: Secondary | ICD-10-CM | POA: Diagnosis not present

## 2023-02-25 DIAGNOSIS — R5383 Other fatigue: Secondary | ICD-10-CM | POA: Diagnosis not present

## 2023-02-25 DIAGNOSIS — R011 Cardiac murmur, unspecified: Secondary | ICD-10-CM | POA: Diagnosis not present

## 2023-02-25 DIAGNOSIS — R6889 Other general symptoms and signs: Secondary | ICD-10-CM | POA: Diagnosis not present

## 2023-02-25 MED ORDER — PROPRANOLOL HCL 10 MG PO TABS
10.0000 mg | ORAL_TABLET | Freq: Two times a day (BID) | ORAL | 11 refills | Status: DC
  Filled 2023-02-25 – 2023-02-27 (×2): qty 60, 30d supply, fill #0
  Filled 2023-03-26: qty 60, 30d supply, fill #1
  Filled 2023-04-27: qty 20, 10d supply, fill #2
  Filled 2023-04-27: qty 40, 20d supply, fill #2
  Filled 2023-04-27: qty 60, 30d supply, fill #2
  Filled 2023-04-27: qty 40, 20d supply, fill #2
  Filled 2023-05-27 (×2): qty 60, 30d supply, fill #3
  Filled 2023-06-15: qty 60, 30d supply, fill #4
  Filled 2023-07-29: qty 60, 30d supply, fill #5
  Filled 2023-08-19 – 2023-08-24 (×2): qty 60, 30d supply, fill #6
  Filled 2023-09-29: qty 60, 30d supply, fill #7
  Filled 2023-10-26: qty 60, 30d supply, fill #8
  Filled 2023-12-01: qty 60, 30d supply, fill #9
  Filled 2023-12-28: qty 60, 30d supply, fill #10
  Filled 2024-01-29: qty 60, 30d supply, fill #11

## 2023-02-26 ENCOUNTER — Other Ambulatory Visit: Payer: Self-pay

## 2023-02-27 ENCOUNTER — Other Ambulatory Visit: Payer: Self-pay

## 2023-02-27 ENCOUNTER — Other Ambulatory Visit (HOSPITAL_COMMUNITY): Payer: Self-pay

## 2023-03-02 ENCOUNTER — Encounter: Payer: Self-pay | Admitting: Podiatry

## 2023-03-02 NOTE — Telephone Encounter (Signed)
Called pt offered some appt she needs to look at work schedule and get back to [place].

## 2023-03-04 ENCOUNTER — Ambulatory Visit (INDEPENDENT_AMBULATORY_CARE_PROVIDER_SITE_OTHER): Payer: 59 | Admitting: Podiatry

## 2023-03-04 ENCOUNTER — Ambulatory Visit: Payer: 59 | Admitting: Podiatry

## 2023-03-04 DIAGNOSIS — S86311S Strain of muscle(s) and tendon(s) of peroneal muscle group at lower leg level, right leg, sequela: Secondary | ICD-10-CM

## 2023-03-04 DIAGNOSIS — M79671 Pain in right foot: Secondary | ICD-10-CM

## 2023-03-04 NOTE — Progress Notes (Signed)
She presents today for follow-up of her right ankle.  States that is doing worse it swelling and is becoming more more painful she stands on it at work.  She had previously had a repair of a peroneal tendon and had healed well.  She still had considerable swelling throughout the recovery however she states that recently prior to her last visit she had felt a pop in the back lateral ankle.  Since that time she has had a tremendous amount of swelling of the right ankle and a weakening of her peroneal tendons.  The peroneal tendon is extremely swollen exactly in the part that is where she felt the pop.  Objective: Vital signs are stable she is alert and oriented x 3.  Pulses are palpable.  Moderate to severe edema in the right lateral ankle.  She has abduction against resistance mobic is painful.  The majority of her symptomatology is in the proximal lateral perineal region of the fibula.  Assessment: It is peroneal tendon tear most likely proximal to the initial repair.  Plan: At this point I am going to request that she have another MRI done so that we can reevaluate the tear in that tendon.  This will be for surgical consideration as well as differential diagnoses.

## 2023-03-05 ENCOUNTER — Encounter: Payer: Self-pay | Admitting: Podiatry

## 2023-03-11 ENCOUNTER — Ambulatory Visit
Admission: RE | Admit: 2023-03-11 | Discharge: 2023-03-11 | Disposition: A | Discharge status: Home / Self Care | Payer: 59 | Source: Ambulatory Visit | Attending: Podiatry | Admitting: Podiatry

## 2023-03-11 DIAGNOSIS — S86311S Strain of muscle(s) and tendon(s) of peroneal muscle group at lower leg level, right leg, sequela: Secondary | ICD-10-CM

## 2023-03-11 DIAGNOSIS — M25571 Pain in right ankle and joints of right foot: Secondary | ICD-10-CM | POA: Diagnosis not present

## 2023-03-11 DIAGNOSIS — M19071 Primary osteoarthritis, right ankle and foot: Secondary | ICD-10-CM | POA: Diagnosis not present

## 2023-03-11 DIAGNOSIS — S86312A Strain of muscle(s) and tendon(s) of peroneal muscle group at lower leg level, left leg, initial encounter: Secondary | ICD-10-CM | POA: Diagnosis not present

## 2023-03-16 ENCOUNTER — Encounter: Payer: Self-pay | Admitting: Neurology

## 2023-03-16 ENCOUNTER — Other Ambulatory Visit: Payer: Self-pay

## 2023-03-16 ENCOUNTER — Other Ambulatory Visit: Payer: Self-pay | Admitting: Neurology

## 2023-03-16 MED ORDER — SUMATRIPTAN 20 MG/ACT NA SOLN
20.0000 mg | NASAL | 5 refills | Status: AC | PRN
  Filled 2023-03-16: qty 6, 5d supply, fill #0

## 2023-03-18 ENCOUNTER — Encounter: Payer: Self-pay | Admitting: Podiatry

## 2023-03-26 ENCOUNTER — Other Ambulatory Visit: Payer: Self-pay

## 2023-03-26 MED ORDER — SERTRALINE HCL 50 MG PO TABS
50.0000 mg | ORAL_TABLET | Freq: Every day | ORAL | 2 refills | Status: DC
  Filled 2023-03-26: qty 90, 90d supply, fill #0
  Filled 2023-06-25: qty 90, 90d supply, fill #1
  Filled 2023-09-29: qty 90, 90d supply, fill #2

## 2023-03-31 DIAGNOSIS — S83412A Sprain of medial collateral ligament of left knee, initial encounter: Secondary | ICD-10-CM | POA: Diagnosis not present

## 2023-04-02 ENCOUNTER — Ambulatory Visit (HOSPITAL_COMMUNITY)
Admission: RE | Admit: 2023-04-02 | Discharge: 2023-04-02 | Disposition: A | Discharge status: Home / Self Care | Payer: 59 | Source: Ambulatory Visit | Attending: Vascular Surgery | Admitting: Vascular Surgery

## 2023-04-02 ENCOUNTER — Other Ambulatory Visit (HOSPITAL_COMMUNITY): Payer: Self-pay | Admitting: Orthopedic Surgery

## 2023-04-02 DIAGNOSIS — M7989 Other specified soft tissue disorders: Secondary | ICD-10-CM | POA: Diagnosis not present

## 2023-04-02 DIAGNOSIS — M79605 Pain in left leg: Secondary | ICD-10-CM

## 2023-04-02 DIAGNOSIS — M25562 Pain in left knee: Secondary | ICD-10-CM | POA: Diagnosis not present

## 2023-04-03 ENCOUNTER — Encounter: Payer: Self-pay | Admitting: Podiatry

## 2023-04-06 DIAGNOSIS — S83512A Sprain of anterior cruciate ligament of left knee, initial encounter: Secondary | ICD-10-CM | POA: Diagnosis not present

## 2023-04-06 DIAGNOSIS — S83412A Sprain of medial collateral ligament of left knee, initial encounter: Secondary | ICD-10-CM | POA: Diagnosis not present

## 2023-04-07 DIAGNOSIS — S83412A Sprain of medial collateral ligament of left knee, initial encounter: Secondary | ICD-10-CM | POA: Diagnosis not present

## 2023-04-07 DIAGNOSIS — S83512A Sprain of anterior cruciate ligament of left knee, initial encounter: Secondary | ICD-10-CM | POA: Diagnosis not present

## 2023-04-08 ENCOUNTER — Telehealth: Payer: Self-pay

## 2023-04-08 NOTE — Telephone Encounter (Signed)
Called Julie [name] to schedule an appointment for MRI results with Dr. Al Corpus. She stated she tore her ACL and is in a brace, not working or driving. She stated she will call back to schedule an appointment once she gets this taken care of.

## 2023-04-14 ENCOUNTER — Encounter: Payer: Self-pay | Admitting: Podiatry

## 2023-04-23 DIAGNOSIS — S83512S Sprain of anterior cruciate ligament of left knee, sequela: Secondary | ICD-10-CM | POA: Diagnosis not present

## 2023-04-23 DIAGNOSIS — S83412A Sprain of medial collateral ligament of left knee, initial encounter: Secondary | ICD-10-CM | POA: Diagnosis not present

## 2023-04-27 ENCOUNTER — Other Ambulatory Visit: Payer: Self-pay

## 2023-05-05 NOTE — Progress Notes (Addendum)
Anesthesia Review:  PCP: Bethanie Dicker  Cardiologist : DR Kirke Corin LOV 11/11/22  Neurology- DR Everlena Cooper LOV 01/21/23- migraines  Chest x-ray : 11/04/22- 2 view  EKG : 11/11/22  Monitor- 01/22/23  Echo : 12/02/22  CT Cors- 11/24/22  Stress test: Cardiac Cath :  Activity level: can do a flight of stairs without difficutly  Sleep Study/ CPAP : none  Fasting Blood Sugar :      / Checks Blood Sugar -- times a day:   Blood Thinner/ Instructions /Last Dose: ASA / Instructions/ Last Dose :    81 mg aspirin    Bari Bed requ on 05/12/23

## 2023-05-05 NOTE — Patient Instructions (Signed)
SURGICAL WAITING ROOM VISITATION  Patients having surgery or a procedure may have no more than 2 support people in the waiting area - these visitors may rotate.    Children under the age of 37 must have an adult with them who is not the patient.  Due to an increase in RSV and influenza rates and associated hospitalizations, children ages 65 and under may not visit patients in [facility] hospitals.  If the patient needs to stay at the hospital during part of their recovery, the visitor guidelines for inpatient rooms apply. Pre-op nurse will coordinate an appropriate time for 1 support person to accompany patient in pre-op.  This support person may not rotate.    Please refer to the [name] website for the visitor guidelines for Inpatients (after your surgery is over and you are in a regular room).       Your procedure is scheduled on:  05/22/2023    Report to [facility] Main Entrance    Report to admitting at  0515 AM   Call this number if you have problems the morning of surgery [phone]   Do not eat food :After Midnight.   After Midnight you may have the following liquids until _ 0430_____ AM DAY OF SURGERY  Water Non-Citrus Juices (without pulp, NO RED-Apple, White grape, White cranberry) Black Coffee (NO MILK/CREAM OR CREAMERS, sugar ok)  Clear Tea (NO MILK/CREAM OR CREAMERS, sugar ok) regular and decaf                             Plain Jell-O (NO RED)                                           Fruit ices (not with fruit pulp, NO RED)                                     Popsicles (NO RED)                                                               Sports drinks like Gatorade- no red                   The day of surgery:  Drink ONE (1) Pre-Surgery Clear Ensure or G2 at 0430 AM  ( have completed by ) the morning of surgery. Drink in one sitting. Do not sip.  This drink was given to you during your hospital  pre-op appointment visit. Nothing else to drink  after completing the  Pre-Surgery Clear Ensure or G2.          If you have questions, please contact your surgeon's office.       Oral Hygiene is also important to reduce your risk of infection.                                    Remember - BRUSH YOUR TEETH THE MORNING OF SURGERY WITH YOUR REGULAR  TOOTHPASTE  DENTURES WILL BE REMOVED PRIOR TO SURGERY PLEASE DO NOT APPLY "Poly grip" OR ADHESIVES!!!   Do NOT smoke after Midnight   Stop all vitamins and herbal supplements 7 days before surgery.   Take these medicines the morning of surgery with A SIP OF WATER:  wellbutrin, claritin, omeprazolek, propanololk zoloft   DO NOT TAKE ANY ORAL DIABETIC MEDICATIONS DAY OF YOUR SURGERY  Bring CPAP mask and tubing day of surgery.                              You may not have any metal on your body including hair pins, jewelry, and body piercing             Do not wear make-up, lotions, powders, perfumes/cologne, or deodorant  Do not wear nail polish including gel and S&S, artificial/acrylic nails, or any other type of covering on natural nails including finger and toenails. If you have artificial nails, gel coating, etc. that needs to be removed by a nail salon please have this removed prior to surgery or surgery may need to be canceled/ delayed if the surgeon/ anesthesia feels like they are unable to be safely monitored.   Do not shave  48 hours prior to surgery.               Men may shave face and neck.   Do not bring valuables to the hospital. [facility] IS NOT             RESPONSIBLE   FOR VALUABLES.   Contacts, glasses, dentures or bridgework may not be worn into surgery.   Bring small overnight bag day of surgery.   DO NOT BRING YOUR HOME MEDICATIONS TO THE HOSPITAL. PHARMACY WILL DISPENSE MEDICATIONS LISTED ON YOUR MEDICATION LIST TO YOU DURING YOUR ADMISSION IN THE HOSPITAL!    Patients discharged on the day of surgery will not be allowed to drive home.  Someone NEEDS to stay  with you for the first 24 hours after anesthesia.   Special Instructions: Bring a copy of your healthcare power of attorney and living will documents the day of surgery if you haven't scanned them before.              Please read over the following fact sheets you were given: IF YOU HAVE QUESTIONS ABOUT YOUR PRE-OP INSTRUCTIONS PLEASE CALL [phone]   If you received a COVID test during your pre-op visit  it is requested that you wear a mask when out in public, stay away from anyone that may not be feeling well and notify your surgeon if you develop symptoms. If you test positive for Covid or have been in contact with anyone that has tested positive in the last 10 days please notify you surgeon.    [facility] - Preparing for Surgery Before surgery, you can play an important role.  Because skin is not sterile, your skin needs to be as free of germs as possible.  You can reduce the number of germs on your skin by washing with CHG (chlorahexidine gluconate) soap before surgery.  CHG is an antiseptic cleaner which kills germs and bonds with the skin to continue killing germs even after washing. Please DO NOT use if you have an allergy to CHG or antibacterial soaps.  If your skin becomes reddened/irritated stop using the CHG and inform your nurse when you arrive at Short Stay. Do not shave (including  legs and underarms) for at least 48 hours prior to the first CHG shower.  You may shave your face/neck. Please follow these instructions carefully:  1.  Shower with CHG Soap the night before surgery and the  morning of Surgery.  2.  If you choose to wash your hair, wash your hair first as usual with your  normal  shampoo.  3.  After you shampoo, rinse your hair and body thoroughly to remove the  shampoo.                           4.  Use CHG as you would any other liquid soap.  You can apply chg directly  to the skin and wash                       Gently with a scrungie or clean washcloth.  5.  Apply  the CHG Soap to your body ONLY FROM THE NECK DOWN.   Do not use on face/ open                           Wound or open sores. Avoid contact with eyes, ears mouth and genitals (private parts).                       Wash face,  Genitals (private parts) with your normal soap.             6.  Wash thoroughly, paying special attention to the area where your surgery  will be performed.  7.  Thoroughly rinse your body with warm water from the neck down.  8.  DO NOT shower/wash with your normal soap after using and rinsing off  the CHG Soap.                9.  Pat yourself dry with a clean towel.            10.  Wear clean pajamas.            11.  Place clean sheets on your bed the night of your first shower and do not  sleep with pets. Day of Surgery : Do not apply any lotions/deodorants the morning of surgery.  Please wear clean clothes to the hospital/surgery center.  FAILURE TO FOLLOW THESE INSTRUCTIONS MAY RESULT IN THE CANCELLATION OF YOUR SURGERY PATIENT SIGNATURE_________________________________  NURSE SIGNATURE__________________________________  ________________________________________________________________________

## 2023-05-12 ENCOUNTER — Other Ambulatory Visit: Payer: Self-pay

## 2023-05-12 ENCOUNTER — Encounter (HOSPITAL_COMMUNITY)
Admission: RE | Admit: 2023-05-12 | Discharge: 2023-05-12 | Disposition: A | Discharge status: Home / Self Care | Payer: 59 | Source: Ambulatory Visit | Attending: Orthopedic Surgery | Admitting: Orthopedic Surgery

## 2023-05-12 ENCOUNTER — Encounter (HOSPITAL_COMMUNITY): Payer: Self-pay

## 2023-05-12 VITALS — BP 142/86 | HR 77 | Temp 97.9°F | Resp 16 | Ht 67.0 in | Wt 312.0 lb

## 2023-05-12 DIAGNOSIS — Z6841 Body Mass Index (BMI) 40.0 and over, adult: Secondary | ICD-10-CM | POA: Insufficient documentation

## 2023-05-12 DIAGNOSIS — K449 Diaphragmatic hernia without obstruction or gangrene: Secondary | ICD-10-CM | POA: Insufficient documentation

## 2023-05-12 DIAGNOSIS — S83512A Sprain of anterior cruciate ligament of left knee, initial encounter: Secondary | ICD-10-CM | POA: Insufficient documentation

## 2023-05-12 DIAGNOSIS — F419 Anxiety disorder, unspecified: Secondary | ICD-10-CM | POA: Insufficient documentation

## 2023-05-12 DIAGNOSIS — Z01818 Encounter for other preprocedural examination: Secondary | ICD-10-CM | POA: Insufficient documentation

## 2023-05-12 DIAGNOSIS — I471 Supraventricular tachycardia, unspecified: Secondary | ICD-10-CM | POA: Diagnosis not present

## 2023-05-12 DIAGNOSIS — F32A Depression, unspecified: Secondary | ICD-10-CM | POA: Insufficient documentation

## 2023-05-12 DIAGNOSIS — K219 Gastro-esophageal reflux disease without esophagitis: Secondary | ICD-10-CM | POA: Insufficient documentation

## 2023-05-12 DIAGNOSIS — E669 Obesity, unspecified: Secondary | ICD-10-CM | POA: Insufficient documentation

## 2023-05-12 DIAGNOSIS — Z87891 Personal history of nicotine dependence: Secondary | ICD-10-CM | POA: Diagnosis not present

## 2023-05-12 DIAGNOSIS — I493 Ventricular premature depolarization: Secondary | ICD-10-CM | POA: Diagnosis not present

## 2023-05-12 HISTORY — DX: Cardiac murmur, unspecified: R01.1

## 2023-05-12 HISTORY — DX: Personal history of urinary calculi: Z87.442

## 2023-05-12 LAB — CBC
HCT: 40.3 % (ref 36.0–46.0)
Hemoglobin: 13.8 g/dL (ref 12.0–15.0)
MCH: 27.9 pg (ref 26.0–34.0)
MCHC: 34.2 g/dL (ref 30.0–36.0)
MCV: 81.4 fL (ref 80.0–100.0)
Platelets: 207 10*3/uL (ref 150–400)
RBC: 4.95 MIL/uL (ref 3.87–5.11)
RDW: 13.2 % (ref 11.5–15.5)
WBC: 6.2 10*3/uL (ref 4.0–10.5)
nRBC: 0 % (ref 0.0–0.2)

## 2023-05-12 LAB — BASIC METABOLIC PANEL
Anion gap: 6 (ref 5–15)
BUN: 14 mg/dL (ref 6–20)
CO2: 26 mmol/L (ref 22–32)
Calcium: 9.3 mg/dL (ref 8.9–10.3)
Chloride: 105 mmol/L (ref 98–111)
Creatinine, Ser: 0.58 mg/dL (ref 0.44–1.00)
GFR, Estimated: >60 mL/min (ref >60)
Glucose, Bld: 90 mg/dL (ref 70–99)
Potassium: 3.9 mmol/L (ref 3.5–5.1)
Sodium: 137 mmol/L (ref 135–145)

## 2023-05-13 ENCOUNTER — Encounter (HOSPITAL_COMMUNITY): Payer: Self-pay

## 2023-05-13 NOTE — Anesthesia Preprocedure Evaluation (Addendum)
Anesthesia Evaluation  Patient identified by MRN, date of birth, ID band Patient awake    Reviewed: Allergy & Precautions, NPO status , Patient's Chart, lab work & pertinent test results  Airway Mallampati: II  TM Distance: >3 FB Neck ROM: Full    Dental no notable dental hx.    Pulmonary former smoker   Pulmonary exam normal        Cardiovascular  Rhythm:Regular Rate:Normal     Neuro/Psych    GI/Hepatic ,GERD  ,,  Endo/Other    Renal/GU      Musculoskeletal   Abdominal Normal abdominal exam  (+)   Peds  Hematology   Anesthesia Other Findings   Reproductive/Obstetrics                             Anesthesia Physical Anesthesia Plan  ASA: 2  Anesthesia Plan: General and Regional   Post-op Pain Management: Regional block*, Celebrex PO (pre-op)* and Tylenol PO (pre-op)*   Induction: Intravenous  PONV Risk Score and Plan: 3 and Ondansetron, Dexamethasone, Midazolam and Treatment may vary due to age or medical condition  Airway Management Planned: Mask and LMA  Additional Equipment: None  Intra-op Plan:   Post-operative Plan: Extubation in OR  Informed Consent: I have reviewed the patients History and Physical, chart, labs and discussed the procedure including the risks, benefits and alternatives for the proposed anesthesia with the patient or authorized representative who has indicated his/her understanding and acceptance.     Dental advisory given  Plan Discussed with: CRNA  Anesthesia Plan Comments: (See PAT note from 12/3 by Sherlie Ban PA-C )        Anesthesia Quick Evaluation

## 2023-05-13 NOTE — Progress Notes (Signed)
Case: [id number] Date/Time: 05/22/23 0715   Procedure: KNEE ARTHROSCOPY WITH ANTERIOR CRUCIATE LIGAMENT (ACL) REPAIR WITH HAMSTRING GRAFT (Left) - 120 min   Anesthesia type: Choice   Pre-op diagnosis: left knee anterior cruciate ligament tear   Location[facility] ROOM 08 / [facility]   Surgeons: Yolonda Kida, MD       DISCUSSION: Julie [name] is a 47 yo female who presents to PAT prior to surgery above. PMH of former smoking, GERD, obesity (BMI 48), anxiety, depression.  Patient had a chest pain evaluation with Cardiology in June 2024. Associated with SOB and was seen in the ED as well. A heart murmur was noted as well so she underwent cardiac CTA and echo which were both normal. CT showed a small hiatal hernia.   VS: BP (!) 142/86   Pulse 77   Temp 36.6 C (Oral)   Resp 16   Ht 5\' 7"  (1.702 m)   Wt (!) 141.5 kg   LMP 09/26/2015 (Exact Date)   SpO2 99%   BMI 48.87 kg/m   PROVIDERS: Assunta Found, MD   LABS: Labs reviewed: Acceptable for surgery. (all labs ordered are listed, but only abnormal results are displayed)  Labs Reviewed  CBC  BASIC METABOLIC PANEL     IMAGES:  CT Chest 11/18/22:   IMPRESSION: Small hiatal hernia.     EKG 11/11/22:  NSR   CV:  Holter monitor 01/14/23:  Patch Wear Time:  8 days and 2 hours (2024-07-24T18:49:31-0400 to 2024-08-01T21:46:56-0400)   Patient had a min HR of 65 bpm, max HR of 167 bpm, and avg HR of 84 bpm. Predominant underlying rhythm was Sinus Rhythm. 1 run of Supraventricular Tachycardia occurred lasting 5 beats with a max rate of 167 bpm (avg 155 bpm).  Rare PACs and rare PVCs. Triggered events did not correlate with arrhythmia.     Echo 12/02/22:  IMPRESSIONS    1. Left ventricular ejection fraction, by estimation, is 60 to 65%. The left ventricle has normal function. The left ventricle has no regional wall motion abnormalities. Left ventricular diastolic parameters were normal. The average left  ventricular global longitudinal strain is -19.4 %.  2. Right ventricular systolic function is normal. The right ventricular size is normal. Tricuspid regurgitation signal is inadequate for assessing PA pressure.  3. The mitral valve is normal in structure. Mild mitral valve regurgitation. No evidence of mitral stenosis.  4. The aortic valve has an indeterminant number of cusps. Aortic valve regurgitation is not visualized. No aortic stenosis is present.  5. The inferior vena cava is normal in size with greater than 50% respiratory variability, suggesting right atrial pressure of 3 mmHg.   Cardiac CTA 11/18/22:  IMPRESSION: 1. Coronary calcium score of 0. This was 0 percentile for age-, sex, and race-matched controls.   2. Normal coronary origin with right dominance.   3. Normal coronary arteries.  CAD RADS 0   Past Medical History:  Diagnosis Date   Anxiety    Breast discharge 07/01/2013   Pt noticed discharge, none on exam today will check TSH and Prolactin   Breast mass, right 10/20/2012   Depression    Dysmenorrhea 09/19/2015   Dyspareunia in female 09/19/2015   Ectopic pregnancy    Eczema    GERD (gastroesophageal reflux disease)    no meds   Headache    Heart murmur    Hernia of abdominal wall 01/04/2013   History of kidney stones    Left breast mass 04/12/2014  Has pea sized nodule at 6 o'clock tender and mobile, will get mammogram and [place] if needed   Menorrhagia with regular cycle 09/19/2015   Pelvic pain in female 09/19/2015   Placenta previa 2012   Current pregnancy     Past Surgical History:  Procedure Laterality Date   ABDOMINAL HYSTERECTOMY N/A 10/10/2015   Procedure: HYSTERECTOMY ABDOMINAL;  Surgeon: Lazaro Arms, MD;  Location: [facility];  Service: Gynecology;  Laterality: N/A;   APPENDECTOMY     APPENDECTOMY     BILATERAL SALPINGECTOMY Bilateral 10/10/2015   Procedure: BILATERAL SALPINGECTOMY;  Surgeon: Lazaro Arms, MD;  Location: [facility];   Service: Gynecology;  Laterality: Bilateral;   BREAST BIOPSY Right 11/04/2012   hylanized tissue with minimal chronic inflammatory tissue   BREAST BIOPSY Right 07/16/2021   CESAREAN SECTION  04/24/2011   Procedure: CESAREAN SECTION;  Surgeon: Lazaro Arms, MD;  Location: [facility];  Service: Gynecology;  Laterality: N/A;  Primary/Previa   CHOLECYSTECTOMY     COLONOSCOPY     CYSTOSCOPY W/ URETERAL STENT PLACEMENT Left 08/04/2014   Procedure: CYSTOSCOPY WITH LEFT  RETROGRADE PYELOGRAM/ LEFT URETERAL STENT PLACEMENT;  Surgeon: Jerilee Field, MD;  Location: [facility];  Service: Urology;  Laterality: Left;   TONSILLECTOMY     UPPER GI ENDOSCOPY      MEDICATIONS:  acetaminophen (TYLENOL) 325 MG tablet   ALPRAZolam (XANAX) 1 MG tablet   aspirin EC 81 MG tablet   botulinum toxin Type A (BOTOX) 200 units injection   buPROPion (WELLBUTRIN XL) 150 MG 24 hr tablet   cholecalciferol (VITAMIN D3) 25 MCG (1000 UNIT) tablet   Cyanocobalamin (B-12) 2500 MCG TABS   loratadine (CLARITIN) 10 MG tablet   meloxicam (MOBIC) 15 MG tablet   omeprazole (PRILOSEC) 20 MG capsule   ondansetron (ZOFRAN) 4 MG tablet   ondansetron (ZOFRAN-ODT) 4 MG disintegrating tablet   propranolol (INDERAL) 10 MG tablet   sertraline (ZOLOFT) 100 MG tablet   sertraline (ZOLOFT) 50 MG tablet   sertraline (ZOLOFT) 50 MG tablet   SUMAtriptan (IMITREX) 20 MG/ACT nasal spray   No current facility-administered medications for this encounter.   Marcille Blanco [facility] Short Stay/Anesthesiology [facility] Phone [phone] 05/13/2023 1:38 PM

## 2023-05-16 DIAGNOSIS — M25511 Pain in right shoulder: Secondary | ICD-10-CM | POA: Diagnosis not present

## 2023-05-20 ENCOUNTER — Other Ambulatory Visit: Payer: Self-pay

## 2023-05-22 ENCOUNTER — Ambulatory Visit (HOSPITAL_COMMUNITY): Payer: 59 | Admitting: Registered Nurse

## 2023-05-22 ENCOUNTER — Ambulatory Visit (HOSPITAL_COMMUNITY): Payer: 59 | Admitting: Medical

## 2023-05-22 ENCOUNTER — Other Ambulatory Visit: Payer: Self-pay

## 2023-05-22 ENCOUNTER — Encounter (HOSPITAL_COMMUNITY): Payer: Self-pay | Admitting: Orthopedic Surgery

## 2023-05-22 ENCOUNTER — Ambulatory Visit: Payer: 59 | Admitting: Neurology

## 2023-05-22 ENCOUNTER — Ambulatory Visit (HOSPITAL_COMMUNITY)
Admission: RE | Admit: 2023-05-22 | Discharge: 2023-05-22 | Disposition: A | Discharge status: Home / Self Care | Payer: 59 | Source: Ambulatory Visit | Attending: Orthopedic Surgery | Admitting: Orthopedic Surgery

## 2023-05-22 ENCOUNTER — Encounter (HOSPITAL_COMMUNITY)
Admission: RE | Disposition: A | Discharge status: Home / Self Care | Payer: Self-pay | Source: Ambulatory Visit | Attending: Orthopedic Surgery

## 2023-05-22 DIAGNOSIS — Z87891 Personal history of nicotine dependence: Secondary | ICD-10-CM | POA: Insufficient documentation

## 2023-05-22 DIAGNOSIS — Z01818 Encounter for other preprocedural examination: Secondary | ICD-10-CM

## 2023-05-22 DIAGNOSIS — K219 Gastro-esophageal reflux disease without esophagitis: Secondary | ICD-10-CM | POA: Insufficient documentation

## 2023-05-22 DIAGNOSIS — X58XXXA Exposure to other specified factors, initial encounter: Secondary | ICD-10-CM | POA: Insufficient documentation

## 2023-05-22 DIAGNOSIS — S83512A Sprain of anterior cruciate ligament of left knee, initial encounter: Secondary | ICD-10-CM

## 2023-05-22 DIAGNOSIS — G8918 Other acute postprocedural pain: Secondary | ICD-10-CM | POA: Diagnosis not present

## 2023-05-22 HISTORY — PX: KNEE ARTHROSCOPY WITH ANTERIOR CRUCIATE LIGAMENT (ACL) REPAIR WITH HAMSTRING GRAFT: SHX5645

## 2023-05-22 SURGERY — KNEE ARTHROSCOPY WITH ANTERIOR CRUCIATE LIGAMENT (ACL) REPAIR WITH HAMSTRING GRAFT
Anesthesia: Regional | Laterality: Left

## 2023-05-22 MED ORDER — CHLORHEXIDINE GLUCONATE 0.12 % MT SOLN
15.0000 mL | Freq: Once | OROMUCOSAL | Status: AC
Start: 2023-05-22 — End: 2023-05-22
  Administered 2023-05-22: 15 mL via OROMUCOSAL

## 2023-05-22 MED ORDER — FENTANYL CITRATE (PF) 100 MCG/2ML IJ SOLN
INTRAMUSCULAR | Status: AC
  Filled 2023-05-22: qty 2

## 2023-05-22 MED ORDER — HYDROMORPHONE HCL 1 MG/ML IJ SOLN
INTRAMUSCULAR | Status: DC | PRN
  Administered 2023-05-22: 1 mg via INTRAVENOUS

## 2023-05-22 MED ORDER — ORAL CARE MOUTH RINSE: 15.0000 mL | Freq: Once | OROMUCOSAL | Status: AC

## 2023-05-22 MED ORDER — DEXAMETHASONE SODIUM PHOSPHATE 10 MG/ML IJ SOLN
INTRAMUSCULAR | Status: DC | PRN
  Administered 2023-05-22: 10 mg

## 2023-05-22 MED ORDER — FENTANYL CITRATE (PF) 100 MCG/2ML IJ SOLN
INTRAMUSCULAR | Status: DC | PRN
  Administered 2023-05-22: 100 ug via INTRAVENOUS
  Administered 2023-05-22 (×4): 25 ug via INTRAVENOUS

## 2023-05-22 MED ORDER — FENTANYL CITRATE PF 50 MCG/ML IJ SOSY
PREFILLED_SYRINGE | INTRAMUSCULAR | Status: AC
  Filled 2023-05-22: qty 1

## 2023-05-22 MED ORDER — DEXAMETHASONE SODIUM PHOSPHATE 10 MG/ML IJ SOLN
INTRAMUSCULAR | Status: AC
  Filled 2023-05-22: qty 1

## 2023-05-22 MED ORDER — SODIUM CHLORIDE 0.9 % IR SOLN
Status: DC | PRN
  Administered 2023-05-22: 6000 mL

## 2023-05-22 MED ORDER — HYDRALAZINE HCL 20 MG/ML IJ SOLN
10.0000 mg | Freq: Once | INTRAMUSCULAR | Status: AC
  Administered 2023-05-22: 10 mg via INTRAVENOUS

## 2023-05-22 MED ORDER — SODIUM CHLORIDE 0.9 % IV SOLN: INTRAVENOUS | Status: DC

## 2023-05-22 MED ORDER — ONDANSETRON HCL 4 MG PO TABS: 4.0000 mg | ORAL_TABLET | Freq: Three times a day (TID) | ORAL | 0 refills | Status: DC | PRN

## 2023-05-22 MED ORDER — ONDANSETRON HCL 4 MG/2ML IJ SOLN
INTRAMUSCULAR | Status: DC | PRN
  Administered 2023-05-22: 4 mg via INTRAVENOUS

## 2023-05-22 MED ORDER — OXYCODONE HCL 5 MG PO TABS
ORAL_TABLET | ORAL | Status: AC
  Administered 2023-05-22: 5 mg via ORAL
  Filled 2023-05-22: qty 1

## 2023-05-22 MED ORDER — CELECOXIB 200 MG PO CAPS
200.0000 mg | ORAL_CAPSULE | Freq: Once | ORAL | Status: AC
  Administered 2023-05-22: 200 mg via ORAL
  Filled 2023-05-22: qty 1

## 2023-05-22 MED ORDER — FENTANYL CITRATE PF 50 MCG/ML IJ SOSY
25.0000 ug | PREFILLED_SYRINGE | INTRAMUSCULAR | Status: DC | PRN
Start: 2023-05-22 — End: 2023-05-22
  Administered 2023-05-22: 50 ug via INTRAVENOUS

## 2023-05-22 MED ORDER — HYDRALAZINE HCL 20 MG/ML IJ SOLN
INTRAMUSCULAR | Status: AC
  Filled 2023-05-22: qty 1

## 2023-05-22 MED ORDER — ACETAMINOPHEN 500 MG PO TABS
1000.0000 mg | ORAL_TABLET | Freq: Once | ORAL | Status: AC
  Administered 2023-05-22: 1000 mg via ORAL
  Filled 2023-05-22: qty 2

## 2023-05-22 MED ORDER — EPHEDRINE 5 MG/ML INJ
INTRAVENOUS | Status: AC
  Filled 2023-05-22: qty 5

## 2023-05-22 MED ORDER — LIDOCAINE HCL (CARDIAC) PF 100 MG/5ML IV SOSY
PREFILLED_SYRINGE | INTRAVENOUS | Status: DC | PRN
  Administered 2023-05-22: 100 mg via INTRAVENOUS

## 2023-05-22 MED ORDER — EPINEPHRINE PF 1 MG/ML IJ SOLN
INTRAMUSCULAR | Status: AC
  Filled 2023-05-22: qty 1

## 2023-05-22 MED ORDER — OXYCODONE HCL 5 MG PO TABS: 5.0000 mg | ORAL_TABLET | ORAL | 0 refills | Status: AC | PRN

## 2023-05-22 MED ORDER — LACTATED RINGERS IV SOLN: INTRAVENOUS | Status: DC

## 2023-05-22 MED ORDER — FENTANYL CITRATE PF 50 MCG/ML IJ SOSY
PREFILLED_SYRINGE | INTRAMUSCULAR | Status: AC
  Administered 2023-05-22: 50 ug via INTRAVENOUS
  Filled 2023-05-22: qty 1

## 2023-05-22 MED ORDER — BUPIVACAINE HCL 0.25 % IJ SOLN
INTRAMUSCULAR | Status: AC
  Filled 2023-05-22: qty 1

## 2023-05-22 MED ORDER — LIDOCAINE HCL (PF) 2 % IJ SOLN
INTRAMUSCULAR | Status: AC
  Filled 2023-05-22: qty 5

## 2023-05-22 MED ORDER — ONDANSETRON HCL 4 MG/2ML IJ SOLN
INTRAMUSCULAR | Status: AC
  Filled 2023-05-22: qty 2

## 2023-05-22 MED ORDER — EPHEDRINE SULFATE-NACL 50-0.9 MG/10ML-% IV SOSY
PREFILLED_SYRINGE | INTRAVENOUS | Status: DC | PRN
  Administered 2023-05-22: 5 mg via INTRAVENOUS
  Administered 2023-05-22: 10 mg via INTRAVENOUS

## 2023-05-22 MED ORDER — MIDAZOLAM HCL 2 MG/2ML IJ SOLN
INTRAMUSCULAR | Status: AC
  Filled 2023-05-22: qty 2

## 2023-05-22 MED ORDER — DEXAMETHASONE SODIUM PHOSPHATE 4 MG/ML IJ SOLN
INTRAMUSCULAR | Status: DC | PRN
  Administered 2023-05-22: 10 mg via INTRAVENOUS

## 2023-05-22 MED ORDER — DROPERIDOL 2.5 MG/ML IJ SOLN: 0.6250 mg | Freq: Once | INTRAMUSCULAR | Status: DC | PRN

## 2023-05-22 MED ORDER — ROPIVACAINE HCL 7.5 MG/ML IJ SOLN
INTRAMUSCULAR | Status: DC | PRN
  Administered 2023-05-22: 20 mL via PERINEURAL

## 2023-05-22 MED ORDER — CEFAZOLIN SODIUM-DEXTROSE 2-4 GM/100ML-% IV SOLN
2.0000 g | INTRAVENOUS | Status: AC
  Administered 2023-05-22: 2 g via INTRAVENOUS
  Filled 2023-05-22: qty 100

## 2023-05-22 MED ORDER — HYDROMORPHONE HCL 2 MG/ML IJ SOLN
INTRAMUSCULAR | Status: AC
  Filled 2023-05-22: qty 1

## 2023-05-22 MED ORDER — PROPOFOL 10 MG/ML IV BOLUS
INTRAVENOUS | Status: DC | PRN
  Administered 2023-05-22: 200 mg via INTRAVENOUS

## 2023-05-22 MED ORDER — MIDAZOLAM HCL 5 MG/5ML IJ SOLN
INTRAMUSCULAR | Status: DC | PRN
  Administered 2023-05-22: 2 mg via INTRAVENOUS

## 2023-05-22 MED ORDER — PROPOFOL 10 MG/ML IV BOLUS
INTRAVENOUS | Status: AC
  Filled 2023-05-22: qty 20

## 2023-05-22 MED ORDER — PHENYLEPHRINE HCL-NACL 20-0.9 MG/250ML-% IV SOLN
INTRAVENOUS | Status: AC
  Filled 2023-05-22: qty 500

## 2023-05-22 MED ORDER — EPINEPHRINE PF 1 MG/ML IJ SOLN
INTRAMUSCULAR | Status: DC | PRN
  Administered 2023-05-22: 1 mL

## 2023-05-22 MED ORDER — OXYCODONE HCL 5 MG PO TABS: 5.0000 mg | ORAL_TABLET | Freq: Once | ORAL | Status: AC

## 2023-05-22 SURGICAL SUPPLY — 69 items
ANCHOR BUTTON TIGHTROPE 14 (Anchor) IMPLANT
ANCHOR SUT FBRTK 2.6 SP1.7 TAP (Anchor) IMPLANT
BAG COUNTER SPONGE SURGICOUNT (BAG) ×2 IMPLANT
BANDAGE ESMARK 6X9 LF (GAUZE/BANDAGES/DRESSINGS) IMPLANT
BLADE SURG 15 STRL LF DISP TIS (BLADE) ×2 IMPLANT
BLADE SURG SZ10 CARB STEEL (BLADE) ×2 IMPLANT
BNDG ELASTIC 6INX 5YD STR LF (GAUZE/BANDAGES/DRESSINGS) ×2 IMPLANT
BNDG ESMARK 6X9 LF (GAUZE/BANDAGES/DRESSINGS)
BUR OVAL CARBIDE 4.0 (BURR) IMPLANT
BURR OVAL 8 FLU 4.0X13 (MISCELLANEOUS) IMPLANT
BURR OVAL 8 FLU 5.0X13 (MISCELLANEOUS) IMPLANT
COVER BACK TABLE 60X90IN (DRAPES) ×2 IMPLANT
CUFF TRNQT CYL 34X4.125X (TOURNIQUET CUFF) ×2 IMPLANT
CUTTER BONE 4.0MM X 13CM (MISCELLANEOUS) ×2 IMPLANT
DRAPE ARTHROSCOPY W/POUCH 114 (DRAPES) ×2 IMPLANT
DRAPE C-ARM 42X120 X-RAY (DRAPES) IMPLANT
DRAPE INCISE IOBAN 66X45 STRL (DRAPES) IMPLANT
DRAPE SHEET LG 3/4 BI-LAMINATE (DRAPES) IMPLANT
DRAPE U-SHAPE 47X51 STRL (DRAPES) ×2 IMPLANT
DURAPREP 26ML APPLICATOR (WOUND CARE) ×2 IMPLANT
ELECT REM PT RETURN 15FT ADLT (MISCELLANEOUS) ×2 IMPLANT
FIBERSTICK 2 (SUTURE) IMPLANT
GAUZE PAD ABD 8X10 STRL (GAUZE/BANDAGES/DRESSINGS) ×2 IMPLANT
GAUZE SPONGE 4X4 12PLY STRL (GAUZE/BANDAGES/DRESSINGS) ×2 IMPLANT
GAUZE XEROFORM 1X8 LF (GAUZE/BANDAGES/DRESSINGS) ×2 IMPLANT
GEL BONE GRAFT DBM ALLOSYNC 5 (Bone Implant) IMPLANT
GLOVE BIO SURGEON STRL SZ7.5 (GLOVE) ×4 IMPLANT
GLOVE BIOGEL PI IND STRL 8 (GLOVE) ×4 IMPLANT
GOWN STRL REUS W/ TWL XL LVL3 (GOWN DISPOSABLE) ×4 IMPLANT
GRAFT TISS 60-80 FRZN TENDON (Tissue) IMPLANT
IMP SYS 2ND FIX PEEK 4.75X19.1 (Miscellaneous) ×1 IMPLANT
IMPL SYS 2ND FX PEEK 4.75X19.1 (Miscellaneous) IMPLANT
IV NS IRRIG 3000ML ARTHROMATIC (IV SOLUTION) ×8 IMPLANT
KIT BASIN OR (CUSTOM PROCEDURE TRAY) ×2 IMPLANT
KIT BIOCARTILAGE LG JOINT MIX (KITS) IMPLANT
KIT TURNOVER KIT A (KITS) IMPLANT
MANIFOLD NEPTUNE II (INSTRUMENTS) ×2 IMPLANT
NDL HYPO 22X1.5 SAFETY MO (MISCELLANEOUS) IMPLANT
NEEDLE HYPO 22X1.5 SAFETY MO (MISCELLANEOUS) IMPLANT
PACK ARTHROSCOPY DSU (CUSTOM PROCEDURE TRAY) ×2 IMPLANT
PAD ARMBOARD 7.5X6 YLW CONV (MISCELLANEOUS) IMPLANT
PADDING CAST COTTON 6X4 STRL (CAST SUPPLIES) IMPLANT
PENCIL SMOKE EVACUATOR (MISCELLANEOUS) IMPLANT
SPONGE T-LAP 4X18 ~~LOC~~+RFID (SPONGE) ×2 IMPLANT
STRIP CLOSURE SKIN 1/2X4 (GAUZE/BANDAGES/DRESSINGS) ×2 IMPLANT
SUCTION TUBE FRAZIER 10FR DISP (SUCTIONS) ×2 IMPLANT
SUT 2 FIBERLOOP 20 STRT BLUE (SUTURE)
SUT FIBERWIRE #2 38 REV NDL BL (SUTURE)
SUT FIBERWIRE #2 38 T-5 BLUE (SUTURE)
SUT MNCRL AB 3-0 PS2 18 (SUTURE) ×2 IMPLANT
SUT VIC AB 0 CT2 27 (SUTURE) IMPLANT
SUT VIC AB 2-0 CT2 27 (SUTURE) ×2 IMPLANT
SUT VICRYL 0 UR6 27IN ABS (SUTURE) ×2 IMPLANT
SUTURE 2 FIBERLOOP 20 STRT BLU (SUTURE) IMPLANT
SUTURE FIBERWR #2 38 T-5 BLUE (SUTURE) IMPLANT
SUTURE FIBERWR#2 38 REV NDL BL (SUTURE) IMPLANT
SUTURE TIGERSTICK 2 TIGERWIR 2 (MISCELLANEOUS) IMPLANT
SYR CONTROL 10ML LL (SYRINGE) ×2 IMPLANT
SYS ALLOGRAFT GRAFTLINK CP2 (Anchor) ×1 IMPLANT
SYSTEM ALLOGRAFT GRAFTLINK CP2 (Anchor) IMPLANT
TIGERSTICK 2 TIGERWIRE 2 (MISCELLANEOUS)
TISSUE GRAFTLINK FGL (Tissue) ×1 IMPLANT
TOWEL OR 17X26 10 PK STRL BLUE (TOWEL DISPOSABLE) ×2 IMPLANT
TUBE SUCTION HIGH CAP CLEAR NV (SUCTIONS) ×2 IMPLANT
TUBING ARTHROSCOPY IRRIG 16FT (MISCELLANEOUS) ×2 IMPLANT
TUBING CONNECTING 10 (TUBING) ×2 IMPLANT
WAND ABLATOR APOLLO I90 (BUR) IMPLANT
WAND APOLLORF SJ50 AR-9845 (SURGICAL WAND) ×2 IMPLANT
WATER STERILE IRR 500ML POUR (IV SOLUTION) ×2 IMPLANT

## 2023-05-22 NOTE — Anesthesia Procedure Notes (Signed)
Procedure Name: LMA Insertion Date/Time: 05/22/2023 7:45 AM  Performed by: Sharyn Dross, CRNAPre-anesthesia Checklist: Patient identified, Emergency Drugs available, Suction available and Patient being monitored Patient Re-evaluated:Patient Re-evaluated prior to induction Oxygen Delivery Method: Circle system utilized Preoxygenation: Pre-oxygenation with 100% oxygen Induction Type: IV induction Ventilation: Mask ventilation without difficulty LMA: LMA inserted LMA Size: 4.0 Number of attempts: 1 Placement Confirmation: positive ETCO2 and breath sounds checked- equal and bilateral Tube secured with: Tape Dental Injury: Teeth and Oropharynx as per pre-operative assessment

## 2023-05-22 NOTE — Brief Op Note (Signed)
05/22/2023  8:57 AM  PATIENT:  Julie [name]  47 y.o. female  PRE-OPERATIVE DIAGNOSIS:  left knee anterior cruciate ligament tear  POST-OPERATIVE DIAGNOSIS:  left knee anterior cruciate ligament tear  PROCEDURE:  Procedure(s) with comments: KNEE ARTHROSCOPY WITH ANTERIOR CRUCIATE LIGAMENT (ACL) REPAIR WITH HAMSTRING GRAFT (Left) - 120 min  SURGEON:  Surgeons and Role:    * Yolonda Kida, MD - Primary  PHYSICIAN ASSISTANT: Dion Saucier, PA-C  ANESTHESIA:   regional and general  EBL:  10 cc  BLOOD ADMINISTERED:none  DRAINS: none   LOCAL MEDICATIONS USED:  NONE  SPECIMEN:  No Specimen  DISPOSITION OF SPECIMEN:  N/A  COUNTS:  YES  TOURNIQUET:  * Missing tourniquet times found for documented tourniquets in log: [id number] *  DICTATION: .Note written in EPIC  PLAN OF CARE: Discharge to home after PACU  PATIENT DISPOSITION:  PACU - hemodynamically stable.   Delay start of Pharmacological VTE agent (>24hrs) due to surgical blood loss or risk of bleeding: not applicable

## 2023-05-22 NOTE — Op Note (Signed)
Surgery Date: 05/22/2023    Surgeon(s): Yolonda Kida, MD   ASSIST: Dion Saucier, PA-C  Assistant attestation:  PA McClung present for the entire procedure.   Implants:  Arthrex all inside cortical buttons on femur and tibia 4.75 PEEK swivel lock x 1. Arthrex graft link allograft 9.5 mm x 65 mm Arthrex 5 cc of STEMI blasts for aperture grafting.   ANESTHESIA:  general, and adductor block   IV FLUIDS AND URINE: See anesthesia.   TOURNIQUET: N/A minutes   DRAINS: none   COMPLICATIONS: None.     ESTIMATED BLOOD LOSS: minimal   PREOPERATIVE DIAGNOSES:  1.  Left knee anterior cruciate ligament rupture    POSTOPERATIVE DIAGNOSES:  same   PROCEDURES PERFORMED:  1.  Left knee arthroscopy with Hamstring allograft ACL reconstruction    DESCRIPTION OF PROCEDURE:  Julie [name] is a 47 year old female with with left knee Complete ACL rupture, possible meniscus tear.  They sustained these injuries about 2 months prior to coming to the operating room today.  After a short period of prehabilitation to allow for return of ROM and quadriceps strenght, we discussed proceeding with arthroscopically assisted hamstring allograft ACL reconstruction and possible meniscectomy versus repair.  We reviewed the risks benefits and indications of this procedure including but not limited to bleeding, infection, damage to neurovascular structures, need for future surgery, developed an of arthrosis, rupture of graft, continued instability of the knee, and developement of blood clots and risk of anesthesia.  All questions answered.   The patient was identified in the preoperative holding area and the operative extremity was marked. The patient was brought to the operating room and transferred to operating table in a supine position. Satisfactory general anesthesia was induced by anesthesiology.     Examination under anesthesia revealed a grade 2B Lachman, grade 2 pivot shift, and stable to  varus and valgus stress.     At the back table, we prepared the allograft per the manufactures protocol.  We utilized closed-loop Endobutton's for and all inside suspensory fixation technique on both femoral side and tibial side.. The graft was pretensioned on the back table and wrapped in a saline-soaked gauze.  The graft measured 65 mm in total length, 9.0 mm on femoral tunnel diameter, and 9.0 mm diameter on the tibial tunnel.   Standard anterolateral, anteromedial arthroscopy portals were obtained. The anteromedial portal was obtained with a spinal needle for localization under direct visualization with subsequent diagnostic findings.    Anteromedial and anterolateral chambers: mild synovitis. The synovitis was debrided with a 4.5 mm full radius shaver through both the anteromedial and lateral portals.    Suprapatellar pouch and gutters: no synovitis or debris. Patella chondral surface: Grade 0 Trochlear chondral surface: Grade 0 Patellofemoral tracking: Midline, no tilt Medial meniscus: Intact, no tear.  Medial femoral condyle flexion bearing surface: Grade 0 Medial femoral condyle extension bearing surface: Grade 0 Medial tibial plateau: Grade 0 Anterior cruciate ligament: Complete proximal tear Posterior cruciate ligament:stable Lateral meniscus: no tear..   Lateral femoral condyle flexion bearing surface: Grade 0 Lateral femoral condyle extension bearing surface: Grade 0 Lateral tibial plateau: Grade 1     Next, the ACL reconstruction was undertaken. The ACL stump was removed with thermal ablation and shaver and anatomic bony landmarks were marked for the placement of the femoral and tibial sockets.  Arthrex retroguides and Flipcutters were used to create the sockets and perform the procedure by an all-inside GraftLink technique.  The femoral socket was  created at the inferior portion of the bifurcate ridge of the lateral femoral wall with a size 9.0 mm FlipCutter to a depth of  15  mm while the tibial socket was created at the center of the ACL footprint from front to back and toward the base of the medial tibial eminence from medial to lateral, to a depth of 23-25 mm with a 9.0 mm FlipCutter. Bony debris was removed and the edges of socket apertures were smoothed. Suture shuttles were used to deliver the graft into the femoral socket first and the tibial socket second. The graft was then secured within the sockets, cinching the self-locking sutures overtop of the proximal and distal cortical buttons with the knee in a reduced position maintained at 20 degrees flexion while a moderate force posterior drawer was applied.  After this preliminary tensioning, the knee was placed through several flexion-extension cycles to eliminate any graft settling or excursion and the graft was re-tensioned in the same manner and the sutures were tied over top of the buttons proximally and distally, and the four tibial sided suture arms were secondarily secured at the proximal tibia with 1 SwiveLock anchor. Of note we did also pass a free labral tape through the ACL fixation as an separate internal brace backup fixation.   Final images of the ACL graft were obtained, revealing no lateral wall or roof impingement of the graft at the notch through range of motion.  Stability of the ACL graft was assessed and found to be normalized at grade 0 lachman and grade 0 pivot shift.    The wounds were all closed in layers per usual.  Dressings were applied and a brace placed And locked in 0 of flexion..  There were no apparent complications.  The patient was awakened and taken to recovery room in satisfactory condition.   POSTOPERATIVE PLAN:  Julie [name] will be touch down weight bearing on crutches until cleared by The therapist.  They will likewise be in The knee brace until quad function is normalized. They will be on 81 mg asa twice daily for 1 month for DVT PPX.  they will return to the clinic to see the  surgeon in 2 weeks.   Yolonda Kida

## 2023-05-22 NOTE — Anesthesia Procedure Notes (Signed)
Anesthesia Regional Block: Adductor canal block   Pre-Anesthetic Checklist: , timeout performed,  Correct Patient, Correct Site, Correct Laterality,  Correct Procedure, Correct Position, site marked,  Risks and benefits discussed,  Surgical consent,  Pre-op evaluation,  At surgeon's request and post-op pain management  Laterality: Left  Prep: Dura Prep       Needles:  Injection technique: Single-shot  Needle Type: Echogenic Stimulator Needle     Needle Length: 10cm  Needle Gauge: 20     Additional Needles:   Procedures:,,,, ultrasound used (permanent image in chart),,    Narrative:  Start time: 05/22/2023 7:10 AM End time: 05/22/2023 7:16 AM Injection made incrementally with aspirations every 5 mL.  Performed by: Personally  Anesthesiologist: Atilano Median, DO  Additional Notes: Patient identified. Risks/Benefits/Options discussed with patient including but not limited to bleeding, infection, nerve damage, failed block, incomplete pain control. Patient expressed understanding and wished to proceed. All questions were answered. Sterile technique was used throughout the entire procedure. Please see nursing notes for vital signs. Aspirated in 5cc intervals with injection for negative confirmation. Patient was given instructions on fall risk and not to get out of bed. All questions and concerns addressed with instructions to call with any issues or inadequate analgesia.

## 2023-05-22 NOTE — Transfer of Care (Signed)
Immediate Anesthesia Transfer of Care Note  Patient: SIANNI STONESIFER  Procedure(s) Performed: KNEE ARTHROSCOPY WITH ANTERIOR CRUCIATE LIGAMENT (ACL) REPAIR WITH HAMSTRING GRAFT (Left)  Patient Location: PACU  Anesthesia Type:General and Regional  Level of Consciousness: drowsy and patient cooperative  Airway & Oxygen Therapy: Patient connected to face mask oxygen  Post-op Assessment: Report given to RN and Post -op Vital signs reviewed and stable  Post vital signs: Reviewed and stable  Last Vitals:  Vitals Value Taken Time  BP 148/98 05/22/23 0912  Temp    Pulse 82 05/22/23 0914  Resp 15 05/22/23 0914  SpO2 100 % 05/22/23 0914  Vitals shown include unfiled device data.  Last Pain:  Vitals:   05/22/23 0615  TempSrc: Oral  PainSc:       Patients Stated Pain Goal: 5 (05/22/23 0548)  Complications: There were no known notable events for this encounter.

## 2023-05-22 NOTE — H&P (Signed)
ORTHOPAEDIC H and P  REQUESTING PHYSICIAN: Yolonda Kida, MD  PCP:  Assunta Found, MD  Chief Complaint: left knee pain  HPI: Julie [name] is a 47 y.o. female who complains of left knee pain and instability.  Here today for surgery.  Past Medical History:  Diagnosis Date   Anxiety    Breast discharge 07/01/2013   Pt noticed discharge, none on exam today will check TSH and Prolactin   Breast mass, right 10/20/2012   Depression    Dysmenorrhea 09/19/2015   Dyspareunia in female 09/19/2015   Ectopic pregnancy    Eczema    GERD (gastroesophageal reflux disease)    no meds   Headache    Heart murmur    Hernia of abdominal wall 01/04/2013   History of kidney stones    Left breast mass 04/12/2014   Has pea sized nodule at 6 o'clock tender and mobile, will get mammogram and [place] if needed   Menorrhagia with regular cycle 09/19/2015   Pelvic pain in female 09/19/2015   Placenta previa 2012   Current pregnancy    Past Surgical History:  Procedure Laterality Date   ABDOMINAL HYSTERECTOMY N/A 10/10/2015   Procedure: HYSTERECTOMY ABDOMINAL;  Surgeon: Lazaro Arms, MD;  Location: [facility];  Service: Gynecology;  Laterality: N/A;   APPENDECTOMY     APPENDECTOMY     BILATERAL SALPINGECTOMY Bilateral 10/10/2015   Procedure: BILATERAL SALPINGECTOMY;  Surgeon: Lazaro Arms, MD;  Location: [facility];  Service: Gynecology;  Laterality: Bilateral;   BREAST BIOPSY Right 11/04/2012   hylanized tissue with minimal chronic inflammatory tissue   BREAST BIOPSY Right 07/16/2021   CESAREAN SECTION  04/24/2011   Procedure: CESAREAN SECTION;  Surgeon: Lazaro Arms, MD;  Location: [facility];  Service: Gynecology;  Laterality: N/A;  Primary/Previa   CHOLECYSTECTOMY     COLONOSCOPY     CYSTOSCOPY W/ URETERAL STENT PLACEMENT Left 08/04/2014   Procedure: CYSTOSCOPY WITH LEFT  RETROGRADE PYELOGRAM/ LEFT URETERAL STENT PLACEMENT;  Surgeon: Jerilee Field, MD;  Location: [facility];  Service:  Urology;  Laterality: Left;   TONSILLECTOMY     UPPER GI ENDOSCOPY     Social History   Socioeconomic History   Marital status: Married    Spouse name: Not on file   Number of children: 1   Years of education: Not on file   Highest education level: Not on file  Occupational History   Not on file  Tobacco Use   Smoking status: Former    Current packs/day: 0.00    Types: Cigarettes    Start date: 09/08/1995    Quit date: 09/07/2009    Years since quitting: 13.7    Passive exposure: Past   Smokeless tobacco: Never  Vaping Use   Vaping status: Never Used  Substance and Sexual Activity   Alcohol use: Yes    Comment: occ mixed drink   Drug use: No   Sexual activity: Yes    Birth control/protection: Surgical    Comment: hyst  Other Topics Concern   Not on file  Social History Narrative   Right handed   One story home   Drinks caffeine   Social Drivers of Health   Financial Resource Strain: Not on file  Food Insecurity: Not on file  Transportation Needs: Not on file  Physical Activity: Not on file  Stress: Not on file  Social Connections: Unknown (10/16/2021)   [facility], [facility]   Social Network  Social Network: Not on file   Family History  Problem Relation Age of Onset   Depression Mother    Cancer Mother    Seizures Mother    Bipolar disorder Mother    Asthma Mother    Atrial fibrillation Mother    Hypertension Father    Asthma Father    Heart disease Maternal Grandmother    Cancer Maternal Grandmother        oral; lymph nodes   Hypertension Maternal Grandmother    Other Maternal Grandmother        memory problems   Atrial fibrillation Maternal Grandmother    Heart disease Maternal Grandfather    Pancreatitis Maternal Grandfather    Cancer Paternal Grandmother        breast   Heart disease Paternal Grandmother    Kidney disease Paternal Grandmother        kidney failure   Diabetes Paternal Grandmother    Breast cancer  Paternal Grandmother    Heart failure Paternal Grandmother    Stroke Paternal Grandfather    Hypertension Paternal Grandfather    Other Paternal Grandfather        brain tumors   Urticaria Son    Allergic rhinitis Neg Hx    Angioedema Neg Hx    Eczema Neg Hx    Immunodeficiency Neg Hx    Allergies  Allergen Reactions   Biaxin [Clarithromycin]     Makes throat raw   Topamax [Topiramate] Itching   Zithromax [Azithromycin]     Makes throat raw   Prior to Admission medications   Medication Sig Start Date End Date Taking? Authorizing Provider  ALPRAZolam Prudy Feeler) 1 MG tablet Take 1 tablet (1 mg total) by mouth 3 (three) times daily. 02/19/23  Yes   aspirin EC 81 MG tablet Take 81 mg by mouth daily. Swallow whole.   Yes [provider]  buPROPion (WELLBUTRIN XL) 150 MG 24 hr tablet Take 1 tablet (150 mg total) by mouth daily. 06/10/22  Yes   cholecalciferol (VITAMIN D3) 25 MCG (1000 UNIT) tablet Take 1,000 Units by mouth daily.   Yes [provider]  Cyanocobalamin (B-12) 2500 MCG TABS Take 2,500 mcg by mouth daily.   Yes [provider]  loratadine (CLARITIN) 10 MG tablet Take 10 mg by mouth daily.   Yes [provider]  meloxicam (MOBIC) 15 MG tablet Take 15 mg by mouth daily as needed for pain.   Yes [provider]  omeprazole (PRILOSEC) 20 MG capsule Take 1 capsule (20 mg total) by mouth 2 (two) times daily. 06/10/22  Yes   propranolol (INDERAL) 10 MG tablet Take 1 tablet (10 mg total) by mouth 2 (two) times daily. 02/25/23  Yes   sertraline (ZOLOFT) 100 MG tablet Take 1 tablet (100 mg total) by mouth every morning. 06/25/22  Yes   sertraline (ZOLOFT) 50 MG tablet Take 1 tablet (50 mg total) by mouth daily. (Take with 100mg  tablet) 03/26/23  Yes   SUMAtriptan (IMITREX) 20 MG/ACT nasal spray Place 1 spray (20 mg total) into the nose as needed for migraine or headache. May repeat in 1 hour if headache persists or recurs.  Maximum 2 sprays in 24  hours. 03/16/23  Yes Everlena Cooper, Adam R, DO  acetaminophen (TYLENOL) 325 MG tablet Take 650 mg by mouth every 6 (six) hours as needed for moderate pain (pain score 4-6).    [provider]  botulinum toxin Type A (BOTOX) 200 units injection Inject 155 units IM  into multiple site in the face,neck and head once every 90 days 02/13/23   Shon Millet R, DO  ondansetron (ZOFRAN) 4 MG tablet Take 1 tablet (4 mg total) by mouth every 8 (eight) hours as needed. 01/21/23   Drema Dallas, DO  ondansetron (ZOFRAN-ODT) 4 MG disintegrating tablet Dissolve 1 tablet (4 mg total) in mouth every 8 (eight) hours as needed for nausea or vomiting. 01/21/23   Everlena Cooper, Adam R, DO  sertraline (ZOLOFT) 50 MG tablet Take 1 tablet (50 mg total) by mouth daily. Take with 100 mg tablet Patient not taking: Reported on 05/04/2023 06/12/21      No results found.  Positive ROS: All other systems have been reviewed and were otherwise negative with the exception of those mentioned in the HPI and as above.  Physical Exam: General: Alert, no acute distress Cardiovascular: No pedal edema Respiratory: No cyanosis, no use of accessory musculature GI: No organomegaly, abdomen is soft and non-tender Skin: No lesions in the area of chief complaint Neurologic: Sensation intact distally Psychiatric: Patient is competent for consent with normal mood and affect Lymphatic: No axillary or cervical lymphadenopathy  MUSCULOSKELETAL: LLE- wwp, nvi  Assessment: Left knee ACL tear  Plan: - plan for arthroscopic assisted ACL recon with allograft.  No new complaints at thtis time.  The risks, benefits, and alternatives were discussed with the patient. There are risks associated with the surgery including, but not limited to, problems with anesthesia (death), infection, differences in leg length/angulation/rotation, fracture of bones, loosening or failure of implants, malunion, nonunion, hematoma (blood accumulation) which may require surgical  drainage, blood clots, pulmonary embolism, nerve injury (foot drop), and blood vessel injury. The patient understands these risks and elects to proceed.   Dc home post op.    Yolonda Kida, MD Cell [phone]    05/22/2023 6:28 AM

## 2023-05-22 NOTE — Discharge Instructions (Addendum)
DISCHARGE INSTRUCTIONS: ________________________________________________________________________________ ACL RECONSTRUCTION HOME EXERCISE PROGRAM (0-2 WEEKS)   Elevate the leg above your heart as often as possible. Weight bear as tolerated, use crutches as needed, progress from 2 to 1 crutch as able using one crutch on opposite side of surgical knee.  Do not limp and do not walk too much!! You should sleep in the knee brace with it locked in full extension.  He may remove for showering.  Otherwise he may remove for exercise. Start normal showering on postoperative day #3.  Do not submerge underwater Goals for first two weeks:  minimal swelling, motion 0-90, walking with brace without crutches and positive attitude about PT. Use pain medication as needed.  You may also take Tylenol and Advil around-the-clock in alternating fashion in addition to the pain medication.  To prevent constipation use Colace 100mg . twice a day while on pain medication.  If constipated, use Miralax 17 gm once a day and drink plenty of fluids.  These medications can be obtained at the pharmacy without a prescription.   Follow up in the office in 14 days. You may remove your postoperative bandages on the third day from surgery and begin showering.  Do not remove the Steri-Strips.  Do not submerge underwater.  Replace your Ace bandage over your wounds before reapplying your knee brace You should also continue to wear the TED hose for 2 weeks postoperatively. You should also take an 81 mg aspirin once per day x 6 weeks for the prevention of DVT.

## 2023-05-23 NOTE — Anesthesia Postprocedure Evaluation (Signed)
Anesthesia Post Note  Patient: Julie [name]  Procedure(s) Performed: KNEE ARTHROSCOPY WITH ANTERIOR CRUCIATE LIGAMENT (ACL) REPAIR WITH HAMSTRING GRAFT (Left)     Patient location during evaluation: PACU Anesthesia Type: Regional and General Level of consciousness: awake and alert Pain management: pain level controlled Vital Signs Assessment: post-procedure vital signs reviewed and stable Respiratory status: spontaneous breathing, nonlabored ventilation, respiratory function stable and patient connected to nasal cannula oxygen Cardiovascular status: blood pressure returned to baseline and stable Postop Assessment: no apparent nausea or vomiting Anesthetic complications: no   There were no known notable events for this encounter.  Last Vitals:  Vitals:   05/22/23 1015 05/22/23 1030  BP: (!) 152/82 (!) 147/79  Pulse: 82 86  Resp: 13 14  Temp:  36.4 C  SpO2: 100% 100%    Last Pain:  Vitals:   05/22/23 1030  TempSrc:   PainSc: 3                  Ruslan Mccabe P Zaryiah Barz

## 2023-05-27 ENCOUNTER — Other Ambulatory Visit: Payer: Self-pay

## 2023-05-27 ENCOUNTER — Other Ambulatory Visit (HOSPITAL_COMMUNITY): Payer: Self-pay

## 2023-05-28 DIAGNOSIS — M25562 Pain in left knee: Secondary | ICD-10-CM | POA: Diagnosis not present

## 2023-05-28 DIAGNOSIS — M25662 Stiffness of left knee, not elsewhere classified: Secondary | ICD-10-CM | POA: Diagnosis not present

## 2023-06-02 DIAGNOSIS — M25662 Stiffness of left knee, not elsewhere classified: Secondary | ICD-10-CM | POA: Diagnosis not present

## 2023-06-02 DIAGNOSIS — M25562 Pain in left knee: Secondary | ICD-10-CM | POA: Diagnosis not present

## 2023-06-05 DIAGNOSIS — M25662 Stiffness of left knee, not elsewhere classified: Secondary | ICD-10-CM | POA: Diagnosis not present

## 2023-06-05 DIAGNOSIS — M25562 Pain in left knee: Secondary | ICD-10-CM | POA: Diagnosis not present

## 2023-06-09 DIAGNOSIS — M25662 Stiffness of left knee, not elsewhere classified: Secondary | ICD-10-CM | POA: Diagnosis not present

## 2023-06-09 DIAGNOSIS — M25562 Pain in left knee: Secondary | ICD-10-CM | POA: Diagnosis not present

## 2023-06-12 DIAGNOSIS — M25562 Pain in left knee: Secondary | ICD-10-CM | POA: Diagnosis not present

## 2023-06-12 DIAGNOSIS — M25662 Stiffness of left knee, not elsewhere classified: Secondary | ICD-10-CM | POA: Diagnosis not present

## 2023-06-15 ENCOUNTER — Other Ambulatory Visit: Payer: Self-pay

## 2023-06-15 MED ORDER — OMEPRAZOLE 20 MG PO CPDR
20.0000 mg | DELAYED_RELEASE_CAPSULE | Freq: Two times a day (BID) | ORAL | 3 refills | Status: DC
  Filled 2023-06-15 – 2023-06-16 (×2): qty 180, 90d supply, fill #0
  Filled 2023-09-16: qty 180, 90d supply, fill #1
  Filled 2023-11-17 – 2023-11-27 (×2): qty 180, 90d supply, fill #2
  Filled 2024-03-14: qty 180, 90d supply, fill #3

## 2023-06-16 ENCOUNTER — Other Ambulatory Visit: Payer: Self-pay

## 2023-06-17 DIAGNOSIS — M25562 Pain in left knee: Secondary | ICD-10-CM | POA: Diagnosis not present

## 2023-06-17 DIAGNOSIS — M25662 Stiffness of left knee, not elsewhere classified: Secondary | ICD-10-CM | POA: Diagnosis not present

## 2023-06-19 DIAGNOSIS — M25662 Stiffness of left knee, not elsewhere classified: Secondary | ICD-10-CM | POA: Diagnosis not present

## 2023-06-19 DIAGNOSIS — M25562 Pain in left knee: Secondary | ICD-10-CM | POA: Diagnosis not present

## 2023-06-23 DIAGNOSIS — M25562 Pain in left knee: Secondary | ICD-10-CM | POA: Diagnosis not present

## 2023-06-23 DIAGNOSIS — M25662 Stiffness of left knee, not elsewhere classified: Secondary | ICD-10-CM | POA: Diagnosis not present

## 2023-06-24 ENCOUNTER — Other Ambulatory Visit: Payer: Self-pay | Admitting: Family Medicine

## 2023-06-24 DIAGNOSIS — Z1231 Encounter for screening mammogram for malignant neoplasm of breast: Secondary | ICD-10-CM

## 2023-06-25 ENCOUNTER — Other Ambulatory Visit: Payer: Self-pay

## 2023-06-25 MED ORDER — ALPRAZOLAM 1 MG PO TABS
1.0000 mg | ORAL_TABLET | Freq: Three times a day (TID) | ORAL | 2 refills | Status: DC
  Filled 2023-06-25: qty 90, 30d supply, fill #0
  Filled 2023-08-19: qty 90, 30d supply, fill #1
  Filled 2023-09-29: qty 90, 30d supply, fill #2

## 2023-06-26 DIAGNOSIS — M25562 Pain in left knee: Secondary | ICD-10-CM | POA: Diagnosis not present

## 2023-06-26 DIAGNOSIS — M25662 Stiffness of left knee, not elsewhere classified: Secondary | ICD-10-CM | POA: Diagnosis not present

## 2023-06-29 DIAGNOSIS — M25662 Stiffness of left knee, not elsewhere classified: Secondary | ICD-10-CM | POA: Diagnosis not present

## 2023-06-29 DIAGNOSIS — M25562 Pain in left knee: Secondary | ICD-10-CM | POA: Diagnosis not present

## 2023-07-03 DIAGNOSIS — M25562 Pain in left knee: Secondary | ICD-10-CM | POA: Diagnosis not present

## 2023-07-03 DIAGNOSIS — M25662 Stiffness of left knee, not elsewhere classified: Secondary | ICD-10-CM | POA: Diagnosis not present

## 2023-07-06 DIAGNOSIS — M79671 Pain in right foot: Secondary | ICD-10-CM | POA: Diagnosis not present

## 2023-07-06 DIAGNOSIS — M25571 Pain in right ankle and joints of right foot: Secondary | ICD-10-CM | POA: Diagnosis not present

## 2023-07-10 DIAGNOSIS — M25662 Stiffness of left knee, not elsewhere classified: Secondary | ICD-10-CM | POA: Diagnosis not present

## 2023-07-10 DIAGNOSIS — M25562 Pain in left knee: Secondary | ICD-10-CM | POA: Diagnosis not present

## 2023-07-14 DIAGNOSIS — M25662 Stiffness of left knee, not elsewhere classified: Secondary | ICD-10-CM | POA: Diagnosis not present

## 2023-07-14 DIAGNOSIS — M25562 Pain in left knee: Secondary | ICD-10-CM | POA: Diagnosis not present

## 2023-07-16 DIAGNOSIS — M25562 Pain in left knee: Secondary | ICD-10-CM | POA: Diagnosis not present

## 2023-07-16 DIAGNOSIS — M25662 Stiffness of left knee, not elsewhere classified: Secondary | ICD-10-CM | POA: Diagnosis not present

## 2023-07-17 ENCOUNTER — Ambulatory Visit: Payer: 59 | Admitting: Neurology

## 2023-07-20 DIAGNOSIS — M25562 Pain in left knee: Secondary | ICD-10-CM | POA: Diagnosis not present

## 2023-07-20 DIAGNOSIS — M25662 Stiffness of left knee, not elsewhere classified: Secondary | ICD-10-CM | POA: Diagnosis not present

## 2023-07-22 ENCOUNTER — Other Ambulatory Visit: Payer: Self-pay

## 2023-07-22 MED ORDER — MELOXICAM 7.5 MG PO TABS
7.5000 mg | ORAL_TABLET | Freq: Two times a day (BID) | ORAL | 0 refills | Status: DC
  Filled 2023-07-22: qty 60, 30d supply, fill #0

## 2023-07-23 DIAGNOSIS — M25662 Stiffness of left knee, not elsewhere classified: Secondary | ICD-10-CM | POA: Diagnosis not present

## 2023-07-23 DIAGNOSIS — M25562 Pain in left knee: Secondary | ICD-10-CM | POA: Diagnosis not present

## 2023-07-28 DIAGNOSIS — M25562 Pain in left knee: Secondary | ICD-10-CM | POA: Diagnosis not present

## 2023-07-28 DIAGNOSIS — M25662 Stiffness of left knee, not elsewhere classified: Secondary | ICD-10-CM | POA: Diagnosis not present

## 2023-07-29 ENCOUNTER — Other Ambulatory Visit: Payer: Self-pay

## 2023-07-30 ENCOUNTER — Other Ambulatory Visit: Payer: Self-pay

## 2023-07-30 MED ORDER — SERTRALINE HCL 100 MG PO TABS
100.0000 mg | ORAL_TABLET | Freq: Every morning | ORAL | 2 refills | Status: DC
  Filled 2023-07-30: qty 90, 90d supply, fill #0
  Filled 2023-10-26: qty 90, 90d supply, fill #1
  Filled 2024-02-09: qty 90, 90d supply, fill #2

## 2023-08-04 DIAGNOSIS — M25662 Stiffness of left knee, not elsewhere classified: Secondary | ICD-10-CM | POA: Diagnosis not present

## 2023-08-04 DIAGNOSIS — M25562 Pain in left knee: Secondary | ICD-10-CM | POA: Diagnosis not present

## 2023-08-06 DIAGNOSIS — M25662 Stiffness of left knee, not elsewhere classified: Secondary | ICD-10-CM | POA: Diagnosis not present

## 2023-08-06 DIAGNOSIS — M25562 Pain in left knee: Secondary | ICD-10-CM | POA: Diagnosis not present

## 2023-08-12 ENCOUNTER — Ambulatory Visit: Payer: Commercial Managed Care - PPO

## 2023-08-12 DIAGNOSIS — M25562 Pain in left knee: Secondary | ICD-10-CM | POA: Diagnosis not present

## 2023-08-12 DIAGNOSIS — M25662 Stiffness of left knee, not elsewhere classified: Secondary | ICD-10-CM | POA: Diagnosis not present

## 2023-08-13 ENCOUNTER — Ambulatory Visit
Admission: RE | Admit: 2023-08-13 | Discharge: 2023-08-13 | Disposition: A | Discharge status: Home / Self Care | Source: Ambulatory Visit | Attending: Family Medicine | Admitting: Family Medicine

## 2023-08-13 DIAGNOSIS — Z1231 Encounter for screening mammogram for malignant neoplasm of breast: Secondary | ICD-10-CM | POA: Diagnosis not present

## 2023-08-18 DIAGNOSIS — M25562 Pain in left knee: Secondary | ICD-10-CM | POA: Diagnosis not present

## 2023-08-18 DIAGNOSIS — M25662 Stiffness of left knee, not elsewhere classified: Secondary | ICD-10-CM | POA: Diagnosis not present

## 2023-08-19 ENCOUNTER — Other Ambulatory Visit: Payer: Self-pay

## 2023-08-20 DIAGNOSIS — M25662 Stiffness of left knee, not elsewhere classified: Secondary | ICD-10-CM | POA: Diagnosis not present

## 2023-08-20 DIAGNOSIS — M25562 Pain in left knee: Secondary | ICD-10-CM | POA: Diagnosis not present

## 2023-08-21 ENCOUNTER — Other Ambulatory Visit: Payer: Self-pay

## 2023-08-24 ENCOUNTER — Ambulatory Visit: Payer: Self-pay | Admitting: Neurology

## 2023-08-25 DIAGNOSIS — M25562 Pain in left knee: Secondary | ICD-10-CM | POA: Diagnosis not present

## 2023-08-25 DIAGNOSIS — M25662 Stiffness of left knee, not elsewhere classified: Secondary | ICD-10-CM | POA: Diagnosis not present

## 2023-08-27 DIAGNOSIS — M25662 Stiffness of left knee, not elsewhere classified: Secondary | ICD-10-CM | POA: Diagnosis not present

## 2023-08-27 DIAGNOSIS — M25562 Pain in left knee: Secondary | ICD-10-CM | POA: Diagnosis not present

## 2023-09-01 DIAGNOSIS — M25662 Stiffness of left knee, not elsewhere classified: Secondary | ICD-10-CM | POA: Diagnosis not present

## 2023-09-01 DIAGNOSIS — M25562 Pain in left knee: Secondary | ICD-10-CM | POA: Diagnosis not present

## 2023-09-02 ENCOUNTER — Other Ambulatory Visit: Payer: Self-pay

## 2023-09-03 ENCOUNTER — Other Ambulatory Visit: Payer: Self-pay

## 2023-09-03 DIAGNOSIS — M25662 Stiffness of left knee, not elsewhere classified: Secondary | ICD-10-CM | POA: Diagnosis not present

## 2023-09-03 DIAGNOSIS — M25562 Pain in left knee: Secondary | ICD-10-CM | POA: Diagnosis not present

## 2023-09-03 MED ORDER — BUPROPION HCL ER (XL) 150 MG PO TB24
150.0000 mg | ORAL_TABLET | Freq: Every day | ORAL | 1 refills | Status: DC
  Filled 2023-09-03: qty 90, 90d supply, fill #0
  Filled 2023-11-12 – 2023-11-16 (×2): qty 90, 90d supply, fill #1

## 2023-09-08 DIAGNOSIS — M25662 Stiffness of left knee, not elsewhere classified: Secondary | ICD-10-CM | POA: Diagnosis not present

## 2023-09-08 DIAGNOSIS — M25562 Pain in left knee: Secondary | ICD-10-CM | POA: Diagnosis not present

## 2023-09-10 DIAGNOSIS — M25662 Stiffness of left knee, not elsewhere classified: Secondary | ICD-10-CM | POA: Diagnosis not present

## 2023-09-10 DIAGNOSIS — M25562 Pain in left knee: Secondary | ICD-10-CM | POA: Diagnosis not present

## 2023-09-14 ENCOUNTER — Other Ambulatory Visit: Payer: Self-pay

## 2023-09-14 DIAGNOSIS — Z0000 Encounter for general adult medical examination without abnormal findings: Secondary | ICD-10-CM | POA: Diagnosis not present

## 2023-09-14 DIAGNOSIS — R609 Edema, unspecified: Secondary | ICD-10-CM | POA: Diagnosis not present

## 2023-09-14 DIAGNOSIS — R252 Cramp and spasm: Secondary | ICD-10-CM | POA: Diagnosis not present

## 2023-09-14 MED ORDER — ROPINIROLE HCL 0.5 MG PO TABS
0.5000 mg | ORAL_TABLET | Freq: Every day | ORAL | 3 refills | Status: DC
  Filled 2023-09-14: qty 90, 90d supply, fill #0

## 2023-09-14 MED ORDER — FUROSEMIDE 40 MG PO TABS
40.0000 mg | ORAL_TABLET | Freq: Every day | ORAL | 2 refills | Status: AC
  Filled 2023-09-14: qty 60, 60d supply, fill #0
  Filled 2023-11-12: qty 60, 60d supply, fill #1

## 2023-09-16 ENCOUNTER — Other Ambulatory Visit: Payer: Self-pay

## 2023-09-24 DIAGNOSIS — Z4789 Encounter for other orthopedic aftercare: Secondary | ICD-10-CM | POA: Diagnosis not present

## 2023-09-29 ENCOUNTER — Other Ambulatory Visit: Payer: Self-pay

## 2023-10-26 ENCOUNTER — Other Ambulatory Visit: Payer: Self-pay

## 2023-11-12 ENCOUNTER — Other Ambulatory Visit: Payer: Self-pay

## 2023-11-12 MED ORDER — ALPRAZOLAM 1 MG PO TABS
1.0000 mg | ORAL_TABLET | Freq: Three times a day (TID) | ORAL | 1 refills | Status: AC
  Filled 2023-11-12: qty 90, 30d supply, fill #0
  Filled 2023-12-28: qty 90, 30d supply, fill #1

## 2023-11-13 ENCOUNTER — Other Ambulatory Visit: Payer: Self-pay

## 2023-11-16 ENCOUNTER — Other Ambulatory Visit: Payer: Self-pay

## 2023-11-17 ENCOUNTER — Other Ambulatory Visit: Payer: Self-pay

## 2023-11-17 MED ORDER — SERTRALINE HCL 50 MG PO TABS
50.0000 mg | ORAL_TABLET | Freq: Every day | ORAL | 0 refills | Status: DC
  Filled 2023-11-17 – 2023-12-28 (×2): qty 90, 90d supply, fill #0

## 2023-11-27 ENCOUNTER — Other Ambulatory Visit: Payer: Self-pay

## 2023-12-01 ENCOUNTER — Other Ambulatory Visit: Payer: Self-pay

## 2023-12-28 ENCOUNTER — Other Ambulatory Visit: Payer: Self-pay

## 2024-01-10 ENCOUNTER — Encounter: Payer: Self-pay | Admitting: Emergency Medicine

## 2024-01-10 ENCOUNTER — Ambulatory Visit
Admission: EM | Admit: 2024-01-10 | Discharge: 2024-01-10 | Disposition: A | Discharge status: Home / Self Care | Payer: Self-pay | Attending: Nurse Practitioner | Admitting: Nurse Practitioner

## 2024-01-10 DIAGNOSIS — J069 Acute upper respiratory infection, unspecified: Secondary | ICD-10-CM

## 2024-01-10 DIAGNOSIS — H6692 Otitis media, unspecified, left ear: Secondary | ICD-10-CM

## 2024-01-10 LAB — POCT RAPID STREP A (OFFICE): Rapid Strep A Screen: NEGATIVE

## 2024-01-10 LAB — POC SOFIA SARS ANTIGEN FIA: SARS Coronavirus 2 Ag: NEGATIVE

## 2024-01-10 MED ORDER — AMOXICILLIN-POT CLAVULANATE 875-125 MG PO TABS
1.0000 | ORAL_TABLET | Freq: Two times a day (BID) | ORAL | 0 refills | Status: AC
Start: 2024-01-10 — End: ?

## 2024-01-10 MED ORDER — LIDOCAINE VISCOUS HCL 2 % MT SOLN: OROMUCOSAL | 0 refills | Status: AC

## 2024-01-10 MED ORDER — PROMETHAZINE-DM 6.25-15 MG/5ML PO SYRP: 5.0000 mL | ORAL_SOLUTION | Freq: Four times a day (QID) | ORAL | 0 refills | Status: AC | PRN

## 2024-01-10 NOTE — ED Provider Notes (Signed)
 [facility]    CSN: [id number] Arrival date & time: 01/10/24  1235      History   Chief Complaint No chief complaint on file.   HPI Julie [name] is a 48 y.o. female.   The history is provided by the patient.   Patient presents with a 3-day history of sore throat, bilateral ear fullness, and cough.  Patient denies fever, chills, headache, ear drainage, wheezing, difficulty breathing, abdominal pain, nausea, vomiting, diarrhea, or rash.  States that she took an old prescription of her son's Augmentin  along with over-the-counter cough medication with minimal relief of symptoms.  Past Medical History:  Diagnosis Date   Anxiety    Breast discharge 07/01/2013   Pt noticed discharge, none on exam today will check TSH and Prolactin   Breast mass, right 10/20/2012   Depression    Dysmenorrhea 09/19/2015   Dyspareunia in female 09/19/2015   Ectopic pregnancy    Eczema    GERD (gastroesophageal reflux disease)    no meds   Headache    Heart murmur    Hernia of abdominal wall 01/04/2013   History of kidney stones    Left breast mass 04/12/2014   Has pea sized nodule at 6 o'clock tender and mobile, will get mammogram and US  if needed   Menorrhagia with regular cycle 09/19/2015   Pelvic pain in female 09/19/2015   Placenta previa 2012   Current pregnancy     Patient Active Problem List   Diagnosis Date Noted   Change in bowel habit 06/25/2021   Colon cancer screening 06/25/2021   Dysphagia 06/25/2021   Gastroesophageal reflux disease 06/25/2021   Rectal bleeding 06/25/2021   Anxiety 01/04/2021   Depressive disorder 01/04/2021   Ovarian cyst 01/10/2019   S/P hysterectomy 10/10/2015   Dyspareunia in female 09/19/2015   Dysmenorrhea 09/19/2015   Left breast mass 04/12/2014   Hernia of abdominal wall 01/04/2013   Breast mass, right 10/20/2012    Past Surgical History:  Procedure Laterality Date   ABDOMINAL HYSTERECTOMY N/A 10/10/2015   Procedure:  HYSTERECTOMY ABDOMINAL;  Surgeon: Vonn VEAR Inch, MD;  Location: [facility];  Service: Gynecology;  Laterality: N/A;   APPENDECTOMY     APPENDECTOMY     BILATERAL SALPINGECTOMY Bilateral 10/10/2015   Procedure: BILATERAL SALPINGECTOMY;  Surgeon: Vonn VEAR Inch, MD;  Location: [facility];  Service: Gynecology;  Laterality: Bilateral;   BREAST BIOPSY Right 11/04/2012   hylanized tissue with minimal chronic inflammatory tissue   BREAST BIOPSY Right 07/16/2021   CESAREAN SECTION  04/24/2011   Procedure: CESAREAN SECTION;  Surgeon: Vonn VEAR Inch, MD;  Location: [facility];  Service: Gynecology;  Laterality: N/A;  Primary/Previa   CHOLECYSTECTOMY     COLONOSCOPY     CYSTOSCOPY W/ URETERAL STENT PLACEMENT Left 08/04/2014   Procedure: CYSTOSCOPY WITH LEFT  RETROGRADE PYELOGRAM/ LEFT URETERAL STENT PLACEMENT;  Surgeon: Donnice Brooks, MD;  Location: [facility];  Service: Urology;  Laterality: Left;   KNEE ARTHROSCOPY WITH ANTERIOR CRUCIATE LIGAMENT (ACL) REPAIR WITH HAMSTRING GRAFT Left 05/22/2023   Procedure: KNEE ARTHROSCOPY WITH ANTERIOR CRUCIATE LIGAMENT (ACL) REPAIR WITH HAMSTRING GRAFT;  Surgeon: Sharl Selinda Dover, MD;  Location: [facility];  Service: Orthopedics;  Laterality: Left;  120 min   TONSILLECTOMY     UPPER GI ENDOSCOPY      OB History     Gravida  2   Para  1   Term  0   Preterm  1   AB  1  Living  1      SAB  0   IAB  0   Ectopic  1   Multiple  0   Live Births  1            Home Medications    Prior to Admission medications   Medication Sig Start Date End Date Taking? Authorizing Provider  amoxicillin -clavulanate (AUGMENTIN ) 875-125 MG tablet Take 1 tablet by mouth every 12 (twelve) hours. 01/10/24  Yes Leath-Warren, Etta PARAS, NP  lidocaine  (XYLOCAINE ) 2 % solution Gargle and spit 5mL every 6 hours as needed for throat pain or discomfort. 01/10/24  Yes Leath-Warren, Etta PARAS, NP  promethazine -dextromethorphan (PROMETHAZINE -DM) 6.25-15 MG/5ML syrup Take 5 mLs by  mouth 4 (four) times daily as needed. 01/10/24  Yes Leath-Warren, Etta PARAS, NP  acetaminophen  (TYLENOL ) 325 MG tablet Take 650 mg by mouth every 6 (six) hours as needed for moderate pain (pain score 4-6).    [provider]  ALPRAZolam  (XANAX ) 1 MG tablet Take 1 tablet (1 mg total) by mouth 3 (three) times daily. 11/12/23     buPROPion  (WELLBUTRIN  XL) 150 MG 24 hr tablet Take 1 tablet (150 mg total) by mouth daily. 09/03/23     cholecalciferol (VITAMIN D3) 25 MCG (1000 UNIT) tablet Take 1,000 Units by mouth daily.    [provider]  Cyanocobalamin  (B-12) 2500 MCG TABS Take 2,500 mcg by mouth daily.    [provider]  furosemide  (LASIX ) 40 MG tablet Take 1 tablet (40 mg total) by mouth daily. 09/14/23     loratadine  (CLARITIN ) 10 MG tablet Take 10 mg by mouth daily.    [provider]  omeprazole  (PRILOSEC) 20 MG capsule Take 1 capsule (20 mg total) by mouth 2 (two) times daily. 06/15/23     ondansetron  (ZOFRAN -ODT) 4 MG disintegrating tablet Dissolve 1 tablet (4 mg total) in mouth every 8 (eight) hours as needed for nausea or vomiting. 01/21/23   Skeet, Adam R, DO  propranolol  (INDERAL ) 10 MG tablet Take 1 tablet (10 mg total) by mouth 2 (two) times daily. 02/25/23     sertraline  (ZOLOFT ) 100 MG tablet Take 1 tablet (100 mg total) by mouth every morning. 07/30/23     sertraline  (ZOLOFT ) 50 MG tablet Take 1 tablet (50 mg total) by mouth daily. (Take with 100mg  tablet) 11/17/23     SUMAtriptan  (IMITREX ) 20 MG/ACT nasal spray Place 1 spray (20 mg total) into the nose as needed for migraine or headache. May repeat in 1 hour if headache persists or recurs.  Maximum 2 sprays in 24 hours. 03/16/23   Skeet Juliene SAUNDERS, DO    Family History Family History  Problem Relation Age of Onset   Depression Mother    Cancer Mother    Seizures Mother    Bipolar disorder Mother    Asthma Mother    Atrial fibrillation Mother    Hypertension Father    Asthma Father    Heart disease  Maternal Grandmother    Cancer Maternal Grandmother        oral; lymph nodes   Hypertension Maternal Grandmother    Other Maternal Grandmother        memory problems   Atrial fibrillation Maternal Grandmother    Heart disease Maternal Grandfather    Pancreatitis Maternal Grandfather    Cancer Paternal Grandmother        breast   Heart disease Paternal Grandmother    Kidney disease Paternal Grandmother  kidney failure   Diabetes Paternal Grandmother    Breast cancer Paternal Grandmother 35 - 71   Heart failure Paternal Grandmother    Stroke Paternal Grandfather    Hypertension Paternal Grandfather    Other Paternal Grandfather        brain tumors   Urticaria Son    Allergic rhinitis Neg Hx    Angioedema Neg Hx    Eczema Neg Hx    Immunodeficiency Neg Hx     Social History Social History   Tobacco Use   Smoking status: Former    Current packs/day: 0.00    Types: Cigarettes    Start date: 09/08/1995    Quit date: 09/07/2009    Years since quitting: 14.3    Passive exposure: Past   Smokeless tobacco: Never  Vaping Use   Vaping status: Never Used  Substance Use Topics   Alcohol use: Yes    Comment: occ mixed drink   Drug use: No     Allergies   Biaxin [clarithromycin], Topamax  [topiramate ], and Zithromax  [azithromycin ]   Review of Systems Review of Systems Per HPI  Physical Exam Triage Vital Signs ED Triage Vitals  Encounter Vitals Group     BP 01/10/24 1250 133/64     Girls Systolic BP Percentile --      Girls Diastolic BP Percentile --      Boys Systolic BP Percentile --      Boys Diastolic BP Percentile --      Pulse Rate 01/10/24 1250 80     Resp 01/10/24 1250 18     Temp 01/10/24 1250 97.7 F (36.5 C)     Temp Source 01/10/24 1250 Oral     SpO2 01/10/24 1250 96 %     Weight --      Height --      Head Circumference --      Peak Flow --      Pain Score 01/10/24 1251 9     Pain Loc --      Pain Education --      Exclude from Growth Chart  --    No data found.  Updated Vital Signs BP 133/64 (BP Location: Right Wrist)   Pulse 80   Temp 97.7 F (36.5 C) (Oral)   Resp 18   LMP 09/26/2015 (Exact Date)   SpO2 96%   Visual Acuity Right Eye Distance:   Left Eye Distance:   Bilateral Distance:    Right Eye Near:   Left Eye Near:    Bilateral Near:     Physical Exam Vitals and nursing note reviewed.  Constitutional:      General: She is not in acute distress.    Appearance: Normal appearance.  HENT:     Right Ear: Tympanic membrane, ear canal and external ear normal.     Left Ear: Ear canal and external ear normal. Tympanic membrane is erythematous and bulging.     Nose: Congestion present.     Right Turbinates: Enlarged and swollen.     Left Turbinates: Enlarged and swollen.     Right Sinus: No maxillary sinus tenderness or frontal sinus tenderness.     Left Sinus: No maxillary sinus tenderness or frontal sinus tenderness.     Mouth/Throat:     Lips: Pink.     Mouth: Mucous membranes are moist.     Pharynx: Uvula midline. Posterior oropharyngeal erythema and postnasal drip present. No pharyngeal swelling, oropharyngeal exudate or uvula swelling.  Eyes:  Extraocular Movements: Extraocular movements intact.     Pupils: Pupils are equal, round, and reactive to light.  Cardiovascular:     Rate and Rhythm: Normal rate and regular rhythm.     Pulses: Normal pulses.     Heart sounds: Normal heart sounds.  Pulmonary:     Effort: Pulmonary effort is normal.     Breath sounds: Normal breath sounds.  Abdominal:     General: Bowel sounds are normal.     Palpations: Abdomen is soft.     Tenderness: There is no abdominal tenderness.  Musculoskeletal:     Cervical back: Normal range of motion.  Lymphadenopathy:     Cervical: No cervical adenopathy.  Skin:    General: Skin is warm and dry.  Neurological:     General: No focal deficit present.     Mental Status: She is alert and oriented to person, place, and  time.  Psychiatric:        Mood and Affect: Mood normal.        Behavior: Behavior normal.      UC Treatments / Results  Labs (all labs ordered are listed, but only abnormal results are displayed) Labs Reviewed  POCT RAPID STREP A (OFFICE) - Normal  CULTURE, GROUP A STREP ([facility])  POC SOFIA SARS ANTIGEN FIA    EKG   Radiology No results found.  Procedures Procedures (including critical care time)  Medications Ordered in UC Medications - No data to display  Initial Impression / Assessment and Plan / UC Course  I have reviewed the triage vital signs and the nursing notes.  Pertinent labs & imaging results that were available during my care of the patient were reviewed by me and considered in my medical decision making (see chart for details).  The rapid strep test and COVID test were negative.  Throat culture is pending.  The patient is well-appearing, she is in no acute distress, vital signs are stable.  She has also been afebrile since symptoms started.  On exam, she does have bulging and erythema of the left tympanic membrane, consistent with left otitis media.  Symptoms are consistent with viral etiology pending the throat culture result.  Will provide symptomatic treatment with Promethazine  DM for the cough, and viscous lidocaine  2% for patient to gargle and spit for throat pain.  Treat otitis media with Augmentin  875/125 mg twice daily for the next 7 days.  Supportive care recommendations were provided discussed with the patient to include over-the-counter analgesics, fluids, rest, and warm compresses to the affected ear.  Discussed indications with patient regarding follow-up.  Patient was in agreement with this plan of care and verbalizes understanding.  All questions were answered.  Patient stable for discharge.  Final Clinical Impressions(s) / UC Diagnoses   Final diagnoses:  Viral URI with cough  Left otitis media, unspecified otitis media type     Discharge  Instructions      The rapid strep test and COVID test were negative.  A throat culture has been ordered.  You will be contacted if the pending test results are abnormal.  You also have access to your results via MyChart. Take medication as prescribed. You may take over-the-counter Tylenol  or ibuprofen  as needed for pain, fever, or general discomfort. Recommend warm salt water gargles 3-4 times daily as needed for throat pain or discomfort.  You may also use over-the-counter Chloraseptic throat spray or throat lozenges while symptoms persist. For the cough, recommend use of a humidifier in  your bedroom at nighttime during sleep and sleeping elevated on pillows while symptoms persist. For your ear pain, recommend warm compresses to the affected ear to help with pain or discomfort. Avoid entrance of water inside of the ear while symptoms persist. Symptoms should improve over the next 5 to 7 days.  If symptoms fail to improve, or begin to worsen, you may follow-up in this clinic or with your primary care physician for further evaluation. Follow-up as needed.     ED Prescriptions     Medication Sig Dispense Auth. Provider   promethazine -dextromethorphan (PROMETHAZINE -DM) 6.25-15 MG/5ML syrup Take 5 mLs by mouth 4 (four) times daily as needed. 118 mL Leath-Warren, Etta PARAS, NP   amoxicillin -clavulanate (AUGMENTIN ) 875-125 MG tablet Take 1 tablet by mouth every 12 (twelve) hours. 14 tablet Leath-Warren, Etta PARAS, NP   lidocaine  (XYLOCAINE ) 2 % solution Gargle and spit 5mL every 6 hours as needed for throat pain or discomfort. 100 mL Leath-Warren, Etta PARAS, NP      PDMP not reviewed this encounter.   Gilmer Etta PARAS, NP 01/10/24 1322

## 2024-01-10 NOTE — Discharge Instructions (Addendum)
 The rapid strep test and COVID test were negative.  A throat culture has been ordered.  You will be contacted if the pending test results are abnormal.  You also have access to your results via MyChart. Take medication as prescribed. You may take over-the-counter Tylenol  or ibuprofen  as needed for pain, fever, or general discomfort. Recommend warm salt water gargles 3-4 times daily as needed for throat pain or discomfort.  You may also use over-the-counter Chloraseptic throat spray or throat lozenges while symptoms persist. For the cough, recommend use of a humidifier in your bedroom at nighttime during sleep and sleeping elevated on pillows while symptoms persist. For your ear pain, recommend warm compresses to the affected ear to help with pain or discomfort. Avoid entrance of water inside of the ear while symptoms persist. Symptoms should improve over the next 5 to 7 days.  If symptoms fail to improve, or begin to worsen, you may follow-up in this clinic or with your primary care physician for further evaluation. Follow-up as needed.

## 2024-01-10 NOTE — ED Triage Notes (Signed)
 Sore throat since Thursday.  Bilateral ears feel full and has been coughing.

## 2024-01-29 ENCOUNTER — Other Ambulatory Visit: Payer: Self-pay

## 2024-02-09 ENCOUNTER — Other Ambulatory Visit: Payer: Self-pay

## 2024-02-10 ENCOUNTER — Other Ambulatory Visit (HOSPITAL_COMMUNITY): Payer: Self-pay

## 2024-02-11 ENCOUNTER — Other Ambulatory Visit: Payer: Self-pay

## 2024-02-16 ENCOUNTER — Other Ambulatory Visit: Payer: Self-pay

## 2024-02-26 ENCOUNTER — Other Ambulatory Visit: Payer: Self-pay

## 2024-02-26 MED ORDER — PROPRANOLOL HCL 10 MG PO TABS
10.0000 mg | ORAL_TABLET | Freq: Two times a day (BID) | ORAL | 0 refills | Status: DC
  Filled 2024-02-26: qty 60, 30d supply, fill #0

## 2024-02-26 MED ORDER — BUPROPION HCL ER (XL) 150 MG PO TB24
150.0000 mg | ORAL_TABLET | Freq: Every day | ORAL | 0 refills | Status: DC
  Filled 2024-02-26: qty 90, 90d supply, fill #0

## 2024-03-01 ENCOUNTER — Other Ambulatory Visit: Payer: Self-pay

## 2024-03-03 ENCOUNTER — Other Ambulatory Visit: Payer: Self-pay

## 2024-03-14 ENCOUNTER — Other Ambulatory Visit: Payer: Self-pay

## 2024-03-14 ENCOUNTER — Other Ambulatory Visit (HOSPITAL_COMMUNITY): Payer: Self-pay

## 2024-03-14 MED ORDER — SERTRALINE HCL 100 MG PO TABS
100.0000 mg | ORAL_TABLET | Freq: Every morning | ORAL | 2 refills | Status: AC
  Filled 2024-03-14 – 2024-05-03 (×3): qty 90, 90d supply, fill #0

## 2024-03-15 ENCOUNTER — Other Ambulatory Visit: Payer: Self-pay

## 2024-03-15 MED ORDER — PROPRANOLOL HCL 10 MG PO TABS
10.0000 mg | ORAL_TABLET | Freq: Two times a day (BID) | ORAL | 4 refills | Status: AC
  Filled 2024-03-15: qty 60, 30d supply, fill #0
  Filled 2024-03-30 – 2024-05-03 (×2): qty 60, 30d supply, fill #1
  Filled 2024-05-30: qty 60, 30d supply, fill #2
  Filled 2024-06-30: qty 60, 30d supply, fill #3

## 2024-03-17 ENCOUNTER — Other Ambulatory Visit: Payer: Self-pay

## 2024-03-18 ENCOUNTER — Other Ambulatory Visit: Payer: Self-pay

## 2024-03-30 ENCOUNTER — Other Ambulatory Visit: Payer: Self-pay

## 2024-03-31 ENCOUNTER — Other Ambulatory Visit: Payer: Self-pay

## 2024-04-01 ENCOUNTER — Other Ambulatory Visit: Payer: Self-pay

## 2024-04-02 ENCOUNTER — Other Ambulatory Visit: Payer: Self-pay

## 2024-04-04 ENCOUNTER — Other Ambulatory Visit: Payer: Self-pay

## 2024-04-05 ENCOUNTER — Other Ambulatory Visit: Payer: Self-pay

## 2024-04-05 MED ORDER — BUPROPION HCL ER (XL) 150 MG PO TB24
150.0000 mg | ORAL_TABLET | Freq: Every morning | ORAL | 1 refills | Status: AC
  Filled 2024-04-05 – 2024-05-30 (×3): qty 90, 90d supply, fill #0

## 2024-04-05 MED ORDER — ALPRAZOLAM 1 MG PO TABS
1.0000 mg | ORAL_TABLET | Freq: Three times a day (TID) | ORAL | 2 refills | Status: AC
  Filled 2024-04-05: qty 90, 30d supply, fill #0
  Filled 2024-05-03 – 2024-05-05 (×2): qty 90, 30d supply, fill #1
  Filled 2024-06-16: qty 90, 30d supply, fill #2

## 2024-04-06 ENCOUNTER — Other Ambulatory Visit: Payer: Self-pay

## 2024-04-06 MED ORDER — SERTRALINE HCL 50 MG PO TABS
50.0000 mg | ORAL_TABLET | Freq: Every day | ORAL | 3 refills | Status: AC
  Filled 2024-04-06 – 2024-04-07 (×2): qty 90, 90d supply, fill #0
  Filled 2024-05-03 – 2024-06-30 (×2): qty 90, 90d supply, fill #1

## 2024-04-07 ENCOUNTER — Other Ambulatory Visit: Payer: Self-pay

## 2024-05-03 ENCOUNTER — Other Ambulatory Visit: Payer: Self-pay

## 2024-05-03 MED ORDER — EMGALITY 120 MG/ML ~~LOC~~ SOAJ
120.0000 mg | SUBCUTANEOUS | 3 refills | Status: AC
  Filled 2024-05-03: qty 1, 30d supply, fill #0
  Filled 2024-05-30: qty 2, 60d supply, fill #1

## 2024-05-03 MED ORDER — NORTRIPTYLINE HCL 10 MG PO CAPS
20.0000 mg | ORAL_CAPSULE | Freq: Every day | ORAL | 3 refills | Status: DC
  Filled 2024-05-03 – 2024-05-30 (×2): qty 60, 30d supply, fill #0

## 2024-05-04 ENCOUNTER — Other Ambulatory Visit: Payer: Self-pay

## 2024-05-16 ENCOUNTER — Other Ambulatory Visit: Payer: Self-pay

## 2024-05-30 ENCOUNTER — Other Ambulatory Visit: Payer: Self-pay

## 2024-05-31 ENCOUNTER — Other Ambulatory Visit: Payer: Self-pay

## 2024-06-07 ENCOUNTER — Other Ambulatory Visit: Payer: Self-pay

## 2024-06-07 MED ORDER — PREDNISONE 20 MG PO TABS
20.0000 mg | ORAL_TABLET | Freq: Two times a day (BID) | ORAL | 0 refills | Status: AC
  Filled 2024-06-07: qty 14, 7d supply, fill #0

## 2024-06-07 MED ORDER — OXYMETAZOLINE HCL 0.05 % NA SOLN
2.0000 | Freq: Two times a day (BID) | NASAL | 0 refills | Status: AC
  Filled 2024-06-07: qty 30, 75d supply, fill #0

## 2024-06-07 MED ORDER — IPRATROPIUM BROMIDE 0.03 % NA SOLN
NASAL | 0 refills | Status: AC
  Filled 2024-06-07: qty 30, 28d supply, fill #0

## 2024-06-16 ENCOUNTER — Other Ambulatory Visit: Payer: Self-pay

## 2024-06-18 ENCOUNTER — Other Ambulatory Visit: Payer: Self-pay

## 2024-06-21 ENCOUNTER — Other Ambulatory Visit: Payer: Self-pay

## 2024-06-22 ENCOUNTER — Other Ambulatory Visit: Payer: Self-pay

## 2024-06-22 MED ORDER — PHENAZOPYRIDINE HCL 200 MG PO TABS
200.0000 mg | ORAL_TABLET | Freq: Three times a day (TID) | ORAL | 0 refills | Status: AC
  Filled 2024-06-22: qty 6, 2d supply, fill #0

## 2024-06-22 MED ORDER — TAMSULOSIN HCL 0.4 MG PO CAPS
0.4000 mg | ORAL_CAPSULE | Freq: Every day | ORAL | 0 refills | Status: AC
  Filled 2024-06-22: qty 20, 20d supply, fill #0

## 2024-06-22 MED ORDER — IBUPROFEN 600 MG PO TABS
600.0000 mg | ORAL_TABLET | Freq: Four times a day (QID) | ORAL | 0 refills | Status: AC | PRN
  Filled 2024-06-22: qty 28, 7d supply, fill #0

## 2024-06-23 ENCOUNTER — Other Ambulatory Visit: Payer: Self-pay

## 2024-06-23 MED ORDER — OMEPRAZOLE 20 MG PO CPDR
20.0000 mg | DELAYED_RELEASE_CAPSULE | Freq: Two times a day (BID) | ORAL | 1 refills | Status: AC
  Filled 2024-06-23: qty 180, 90d supply, fill #0

## 2024-06-30 ENCOUNTER — Other Ambulatory Visit: Payer: Self-pay

## 2024-07-06 ENCOUNTER — Other Ambulatory Visit: Payer: Self-pay

## 2024-07-07 ENCOUNTER — Other Ambulatory Visit: Payer: Self-pay

## 2024-07-07 MED ORDER — NORTRIPTYLINE HCL 10 MG PO CAPS
ORAL_CAPSULE | ORAL | 0 refills | Status: AC
  Filled 2024-07-07: qty 60, 33d supply, fill #0
